# Patient Record
Sex: Male | Born: 1945 | Race: Asian | Hispanic: No | Marital: Married | Smoking: Never smoker
Health system: Southern US, Community
[De-identification: ages and names within clinical notes are randomized; demographics above are authoritative.]

## PROBLEM LIST (undated history)

## (undated) DIAGNOSIS — M1990 Unspecified osteoarthritis, unspecified site: Secondary | ICD-10-CM

## (undated) DIAGNOSIS — Z973 Presence of spectacles and contact lenses: Secondary | ICD-10-CM

## (undated) DIAGNOSIS — U071 COVID-19: Secondary | ICD-10-CM

## (undated) DIAGNOSIS — E785 Hyperlipidemia, unspecified: Secondary | ICD-10-CM

## (undated) DIAGNOSIS — C61 Malignant neoplasm of prostate: Secondary | ICD-10-CM

## (undated) DIAGNOSIS — Z87442 Personal history of urinary calculi: Secondary | ICD-10-CM

## (undated) DIAGNOSIS — N200 Calculus of kidney: Secondary | ICD-10-CM

## (undated) HISTORY — DX: COVID-19: U07.1

## (undated) HISTORY — DX: Hyperlipidemia, unspecified: E78.5

## (undated) HISTORY — DX: Unspecified osteoarthritis, unspecified site: M19.90

---

## 1970-01-02 HISTORY — PX: APPENDECTOMY: SHX54

## 1974-01-02 HISTORY — PX: ABDOMINAL ADHESION SURGERY: SHX90

## 2002-01-06 ENCOUNTER — Ambulatory Visit (HOSPITAL_COMMUNITY): Admission: RE | Admit: 2002-01-06 | Discharge: 2002-01-06 | Payer: Self-pay | Admitting: *Deleted

## 2002-01-06 HISTORY — PX: COLONOSCOPY: SHX5424

## 2003-07-13 ENCOUNTER — Emergency Department (HOSPITAL_COMMUNITY): Admission: EM | Admit: 2003-07-13 | Discharge: 2003-07-13 | Payer: Self-pay | Admitting: Emergency Medicine

## 2003-08-30 ENCOUNTER — Emergency Department (HOSPITAL_COMMUNITY): Admission: EM | Admit: 2003-08-30 | Discharge: 2003-08-31 | Payer: Self-pay | Admitting: Emergency Medicine

## 2007-05-30 ENCOUNTER — Emergency Department (HOSPITAL_COMMUNITY): Admission: EM | Admit: 2007-05-30 | Discharge: 2007-05-30 | Payer: Self-pay | Admitting: Emergency Medicine

## 2007-11-23 ENCOUNTER — Encounter: Admission: RE | Admit: 2007-11-23 | Discharge: 2007-11-23 | Payer: Self-pay | Admitting: Orthopedic Surgery

## 2010-09-28 LAB — POCT I-STAT, CHEM 8
Calcium, Ion: 1.16
Potassium: 4.1
Sodium: 138

## 2010-09-28 LAB — URINALYSIS, ROUTINE W REFLEX MICROSCOPIC
Hgb urine dipstick: NEGATIVE
Ketones, ur: 40 — AB
Nitrite: NEGATIVE
Protein, ur: NEGATIVE
Specific Gravity, Urine: 1.014
pH: 7.5

## 2011-01-26 ENCOUNTER — Encounter: Payer: Self-pay | Admitting: Internal Medicine

## 2011-02-14 ENCOUNTER — Ambulatory Visit (INDEPENDENT_AMBULATORY_CARE_PROVIDER_SITE_OTHER): Payer: 59 | Admitting: Internal Medicine

## 2011-02-14 ENCOUNTER — Encounter: Payer: Self-pay | Admitting: Internal Medicine

## 2011-02-14 VITALS — BP 120/70 | HR 64 | Ht 68.0 in | Wt 145.6 lb

## 2011-02-14 DIAGNOSIS — R1013 Epigastric pain: Secondary | ICD-10-CM | POA: Insufficient documentation

## 2011-02-14 DIAGNOSIS — K3189 Other diseases of stomach and duodenum: Secondary | ICD-10-CM

## 2011-02-14 NOTE — Patient Instructions (Signed)
Stop the fish oil for 1 month to see if this is the cause of your problems if so stay of the medications if not then please give [place] a call back.

## 2011-02-14 NOTE — Progress Notes (Signed)
  Subjective:    Patient ID: Shawn Walsh, male    DOB: 05-25-1945, 66 y.o.   MRN: [id number]  HPI This very pleasant Asian man is here with his wife today for evaluation of heartburn and belching. He says for the past several months he's had an increase in postprandial belching and bloating and burping. There has been some heartburn. He cannot identify any particular triggers. It occurs within 30 minutes of eating. He denies change in bowel habits or lower abdominal bloating or distention. He has not had any unintentional weight loss and there is no dysphagia or bleeding reported. He drinks 1 PM today as far as caffeine is concern that he does not smoke. He did start fish oil in the past few months though he thinks his symptoms of belching and reflux started before the fish oil was begun. He took Zegerid for several weeks and noted an excellent response, to this over-the-counter agent.  His GI review of systems is otherwise negative. I have also seen his son and I believe his daughter as a patient in the past, we collectively remembered today. Allergies  Allergen Reactions  . Augmentin   . Avelox (Moxifloxacin Hcl In Nacl)    No outpatient prescriptions prior to visit.   Past Medical History  Diagnosis Date  . HLD (hyperlipidemia)    Past Surgical History  Procedure Date  . Appendectomy   . Colonoscopy 01/06/2002    normal   History   Social History  . Marital Status: Married    Spouse Name: N/A    Number of Children: 3  .     Occupational History  . insurance    Social History Main Topics  . Smoking status: Never Smoker   . Smokeless tobacco: Never Used  . Alcohol Use: No  . Drug Use: No  . Sexually Active: None     Family History  Problem Relation Age of Onset  . Hyperlipidemia Brother         Review of Systems He feels well otherwise, he does have some hearing difficulty. All other review of systems negative except for as in history of present illness.      Objective:   Physical Exam General:  Well-developed, well-nourished and in no acute distress Eyes:  anicteric. ENT:   Mouth and posterior pharynx free of lesions.  Neck:   supple w/o thyromegaly or mass.  Lungs: Clear to auscultation bilaterally. Heart:  S1S2, no rubs, murmurs, gallops. Abdomen:  soft, non-tender, no hepatosplenomegaly, hernia, or mass and BS+.  Lymph:  no cervical or supraclavicular adenopathy. Extremities:   no edema Neuro:  A&O x 3.  Psych:  appropriate mood and  Affect.   Data Reviewed:  2004 colonoscopy, normal      Assessment & Plan:

## 2011-02-14 NOTE — Assessment & Plan Note (Signed)
Belching, bloating and some heartburn. In the setting of fish oil use. It has responded well to Zegerid which is not currently taking. It could be the fish oil though I am not sure the time course is related. There were no worrisome or danger features so I don't think endoscopy is needed at this time.  The plan is for him to stop his fish oil and see if that'll limited to symptoms over a month. If it does not, then would most likely prescribed Zegerid, at the lower dosage to work. Depending upon the course could need to consider upper endoscopy. He asked about Helicobacter pylori, and his symptoms are more of a reflux-like dyspepsia and not ulcer dyspepsia. Thus, I would not look for or treat that based upon what I know at this time.

## 2011-08-12 DIAGNOSIS — R1013 Epigastric pain: Secondary | ICD-10-CM | POA: Diagnosis not present

## 2011-08-14 ENCOUNTER — Telehealth: Payer: Self-pay | Admitting: Internal Medicine

## 2011-08-14 NOTE — Telephone Encounter (Signed)
Patient advised that I will add him to the cancellation list and call him with an appt if one becomes available.  I did explain that DR. Leone Payor is out of the office this week and the week of 08/28/11

## 2011-09-11 ENCOUNTER — Telehealth: Payer: Self-pay

## 2011-09-11 ENCOUNTER — Other Ambulatory Visit (INDEPENDENT_AMBULATORY_CARE_PROVIDER_SITE_OTHER): Payer: 59

## 2011-09-11 ENCOUNTER — Encounter: Payer: Self-pay | Admitting: Internal Medicine

## 2011-09-11 ENCOUNTER — Ambulatory Visit (INDEPENDENT_AMBULATORY_CARE_PROVIDER_SITE_OTHER): Payer: 59 | Admitting: Internal Medicine

## 2011-09-11 VITALS — BP 128/64 | HR 60 | Ht 68.0 in | Wt 145.0 lb

## 2011-09-11 DIAGNOSIS — R112 Nausea with vomiting, unspecified: Secondary | ICD-10-CM

## 2011-09-11 DIAGNOSIS — K660 Peritoneal adhesions (postprocedural) (postinfection): Secondary | ICD-10-CM

## 2011-09-11 DIAGNOSIS — R1033 Periumbilical pain: Secondary | ICD-10-CM | POA: Diagnosis not present

## 2011-09-11 LAB — CBC WITH DIFFERENTIAL/PLATELET
Basophils Absolute: 0 10*3/uL (ref 0.0–0.1)
Basophils Relative: 0.4 % (ref 0.0–3.0)
Eosinophils Absolute: 0.4 10*3/uL (ref 0.0–0.7)
Eosinophils Relative: 5.8 % — ABNORMAL HIGH (ref 0.0–5.0)
HCT: 44 % (ref 39.0–52.0)
Hemoglobin: 14.4 g/dL (ref 13.0–17.0)
Lymphocytes Relative: 36.1 % (ref 12.0–46.0)
Lymphs Abs: 2.6 10*3/uL (ref 0.7–4.0)
MCHC: 32.6 g/dL (ref 30.0–36.0)
MCV: 94.8 fl (ref 78.0–100.0)
Monocytes Absolute: 0.6 10*3/uL (ref 0.1–1.0)
Monocytes Relative: 8.4 % (ref 3.0–12.0)
Neutro Abs: 3.5 10*3/uL (ref 1.4–7.7)
Neutrophils Relative %: 49.3 % (ref 43.0–77.0)
Platelets: 204 10*3/uL (ref 150.0–400.0)
RBC: 4.64 Mil/uL (ref 4.22–5.81)
RDW: 13.3 % (ref 11.5–14.6)
WBC: 7.1 10*3/uL (ref 4.5–10.5)

## 2011-09-11 LAB — COMPREHENSIVE METABOLIC PANEL
AST: 24 U/L (ref 0–37)
CO2: 28 mEq/L (ref 19–32)
Calcium: 9.5 mg/dL (ref 8.4–10.5)
Chloride: 104 mEq/L (ref 96–112)
Creatinine, Ser: 0.8 mg/dL (ref 0.4–1.5)
Glucose, Bld: 99 mg/dL (ref 70–99)
Potassium: 4 mEq/L (ref 3.5–5.1)
Sodium: 138 mEq/L (ref 135–145)
Total Protein: 7.1 g/dL (ref 6.0–8.3)

## 2011-09-11 NOTE — Patient Instructions (Addendum)
Your physician has requested that you go to the basement for the following lab work before leaving today: CBC, CMET  You have been scheduled for a CT scan of the abdomen and pelvis at [facility] ([address]---this is in the same building as Architectural technologist).   You are scheduled on __9/10/13 at ____2:30pm. You should arrive 15 minutes prior to your appointment time for registration. Please follow the written instructions below on the day of your exam:  WARNING: IF YOU ARE ALLERGIC TO IODINE/X-RAY DYE, PLEASE NOTIFY RADIOLOGY IMMEDIATELY AT [phone]! YOU WILL BE GIVEN A 13 HOUR PREMEDICATION PREP.  1) Do not eat or drink anything after __10:30am_ (4 hours prior to your test) 2) You have been given 2 bottles of oral contrast to drink. The solution may taste  better if refrigerated, but do NOT add ice or any other liquid to this solution. Shake  well before drinking.    Drink 1 bottle of contrast @ __12:30pm__________ (2 hours prior to your exam)  Drink 1 bottle of contrast @ ___1:30pm__________ (1 hour prior to your exam)  You have been given a low fiber diet to read and follow.   Thank you for choosing me and [facility].  Iva Boop, M.D., [facility]   You may take any medications as prescribed with a small amount of water except for the following: Metformin, Glucophage, Glucovance, Avandamet, Riomet, Fortamet, Actoplus Met, Janumet, Glumetza or Metaglip. The above medications must be held the day of the exam AND 48 hours after the exam.  The purpose of you drinking the oral contrast is to aid in the visualization of your intestinal tract. The contrast solution may cause some diarrhea. Before your exam is started, you will be given a small amount of fluid to drink. Depending on your individual set of symptoms, you may also receive an intravenous injection of x-ray contrast/dye. Plan on being at [facility] for 30 minutes or long, depending on the  type of exam you are having performed.  If you have any questions regarding your exam or if you need to reschedule, you may call the CT department at [phone] between the hours of 8:00 am and 5:00 pm, Monday-Friday.  ________________________________________________________________________

## 2011-09-11 NOTE — Telephone Encounter (Signed)
Lm on cell# and home# that we are postponing his CT Abd/pelvis due to insurance issues.  Dr. Leone Payor was made aware of this situation late 09/11/11 and said we will probably wait until he is symptomatic to order again.  I also called and LM on Rose's VM that we are cancelling the CT for now.

## 2011-09-11 NOTE — Progress Notes (Addendum)
Subjective:    Patient ID: Shawn Walsh, male    DOB: [birth year], 66 y.o.   MRN: [id number]  HPI The patient is a very pleasant elderly Asian [place] man for followup. He was last seen in February, he had a lot of abdominal bloating and gas complaints and I advised she stop fish oil. He said that helped him significantly.  However since then he said some recurrent episodes of fairly intense abdominal pain with nausea and vomiting that last for several hours and spontaneously remit. He relates that in 1972 he had an appendectomy. He was living in [place], subsequent had problems with abdominal pain attributed to adhesions and had what sounds like a lysis of adhesions. He had an episode of small bowel obstruction symptoms in 2002. In 2012 he had severe abdominal pain and vomiting for several hours that remitted. Over the past few months she's had 3-4 episodes of similar problems with periumbilical and lower abdominal pain associated with intense pain and nausea and vomiting and then he eventually feels better. After the last episode he did go to the [facility] and he was well and was advised to get GI followup. His wife had wanted her to go to the emergency department when he was ill at night a few weeks ago but he did not. He does not get acutely constipated or have any bowel movement problems. He has no unintentional weight loss. He has not had fevers.   Review of Systems As per history of present illness.    Objective:   Physical Exam General:  NAD Eyes:   anicteric Lungs:  clear Heart:  S1S2 no rubs, murmurs or gallops Abdomen:  soft and nontender, BS+, no herniae, RLQ transverse scar Ext:   no edema    Data Reviewed:  CT scan 2005  Clinical Data: Abdominal pain.  CT ABDOMEN AND PELVIS WITH CONTRAST  Multidetector helical CT imaging was performed through the abdomen and pelvis following dilute oral contrast and 100 cc Omnipaque 300 IV.  CT ABDOMEN  Liver, spleen, pancreas,  adrenals and kidneys unremarkable. Gallbladder and bowel grossly unremarkable except for a large amount of stool throughout the colon. No free fluid, free air, or adenopathy.  IMPRESSION  Constipation. No acute disease.  CT PELVIS  Appendix is not visualized. There is no inflammatory process seen in the right lower quadrant. There is a small amount of free fluid in the cul-de-sac of the pelvis. Large amount of stool is again noted in the colon.  IMPRESSION  Appendix not visualized but no definite evidence of appendicitis.  Small amount of free fluid in the pelvis.  Mild constipation.       Assessment & Plan:   1. Periumbilical pain   2. Nausea and vomiting   3. Intestinal adhesions    1. This sounds like he could be having a recurrent partial small bowel obstructions. It can be difficult to prove. It concerns him a great deal. 2. Had ordered a CT abdomen pelvis but when going to precertification process insurance company balked. The other thing we talked about was, waiting until he had another episode and getting an x-ray. That is not unreasonable. I will call the patient and discuss this, as I do think it is unlikely we would see anything on the CT scan at this time, and would be more likely be old information if he had imaging during the time of symptoms. Patient wishes to proceed with CT if possible. 3. Even if he does  have adhesions, if they are self-limited problems associated with it I think it would be unlikely at any one would operate. This was already explained to the patient. 4. Comprehensive metabolic panel was ordered as well. CBC also  Lab Results  Component Value Date   WBC 7.1 09/11/2011   HGB 14.4 09/11/2011   HCT 44.0 09/11/2011   MCV 94.8 09/11/2011   PLT 204.0 09/11/2011     Chemistry      Component Value Date/Time   NA 138 09/11/2011 1213   K 4.0 09/11/2011 1213   CL 104 09/11/2011 1213   CO2 28 09/11/2011 1213   BUN 20 09/11/2011 1213   CREATININE 0.8 09/11/2011 1213        Component Value Date/Time   CALCIUM 9.5 09/11/2011 1213   ALKPHOS 73 09/11/2011 1213   AST 24 09/11/2011 1213   ALT 21 09/11/2011 1213   BILITOT 1.3* 09/11/2011 1213     Spoke to [facility] - CT approved   #cc [phone] - good through 11/06/2011  4:21 PM Iva Boop, MD, [facility]

## 2011-09-12 ENCOUNTER — Telehealth: Payer: Self-pay

## 2011-09-12 ENCOUNTER — Other Ambulatory Visit: Payer: 59

## 2011-09-12 NOTE — Telephone Encounter (Signed)
Spoke to pt and informed him that we are postponing his CT for now due to insurance issues.  He had not gotten my multiple messages that I left last night for him.  Pt was comfortable with your decision to hold on this for now.  He will call back when/if he becomes symptomatic again.

## 2011-09-19 NOTE — Progress Notes (Signed)
Quick Note:  I think he knows this but labs are ok  He should have a CT if he develops sxs again - either by calling us or going to ED after hours  I can call him when I return if he desires ______

## 2011-09-20 ENCOUNTER — Telehealth: Payer: Self-pay | Admitting: Internal Medicine

## 2011-09-20 DIAGNOSIS — J019 Acute sinusitis, unspecified: Secondary | ICD-10-CM | POA: Diagnosis not present

## 2011-09-25 ENCOUNTER — Encounter: Payer: Self-pay | Admitting: Gastroenterology

## 2011-09-25 ENCOUNTER — Other Ambulatory Visit: Payer: Self-pay

## 2011-09-25 DIAGNOSIS — K660 Peritoneal adhesions (postprocedural) (postinfection): Secondary | ICD-10-CM

## 2011-09-25 DIAGNOSIS — R1033 Periumbilical pain: Secondary | ICD-10-CM

## 2011-09-25 DIAGNOSIS — R112 Nausea with vomiting, unspecified: Secondary | ICD-10-CM

## 2011-09-25 NOTE — Telephone Encounter (Signed)
Would you like to do the MD review on this case?

## 2011-09-25 NOTE — Telephone Encounter (Signed)
Thanks, I see you've already done this call.

## 2011-09-25 NOTE — Telephone Encounter (Signed)
I did MD review and wrote the approval # into referral and my original office note

## 2011-09-25 NOTE — Telephone Encounter (Signed)
Would you like to do the MD review on this one?

## 2011-09-25 NOTE — Progress Notes (Signed)
Pt contacted and informed that his CT Abd. Darrold Junker is set up for Oct. 1st.  At 1 pm,  NPO 4 hours, drink first bottle of contrast at 11:00am and second bottle at 12:00noon.  To be done at [facility]. Location.  Pt aware of location.  He will call back with any questions.

## 2011-10-03 ENCOUNTER — Ambulatory Visit (INDEPENDENT_AMBULATORY_CARE_PROVIDER_SITE_OTHER)
Admission: RE | Admit: 2011-10-03 | Discharge: 2011-10-03 | Disposition: A | Discharge status: Home / Self Care | Payer: 59 | Source: Ambulatory Visit | Attending: Internal Medicine | Admitting: Internal Medicine

## 2011-10-03 DIAGNOSIS — K660 Peritoneal adhesions (postprocedural) (postinfection): Secondary | ICD-10-CM | POA: Diagnosis not present

## 2011-10-03 DIAGNOSIS — N200 Calculus of kidney: Secondary | ICD-10-CM | POA: Diagnosis not present

## 2011-10-03 DIAGNOSIS — R1033 Periumbilical pain: Secondary | ICD-10-CM | POA: Diagnosis not present

## 2011-10-03 DIAGNOSIS — R112 Nausea with vomiting, unspecified: Secondary | ICD-10-CM

## 2011-10-03 MED ORDER — IOHEXOL 300 MG/ML  SOLN
100.0000 mL | Freq: Once | INTRAMUSCULAR | Status: AC | PRN
  Administered 2011-10-03: 100 mL via INTRAVENOUS

## 2011-10-03 NOTE — Progress Notes (Signed)
Quick Note:  Please let him know he has kidney stones (in the kidneys - not a problem unless they move out) but no other problems on the CT. He is likely either having spasms or more likely intermittent partial small bowel obstructions from prior surgery and adhesions. As long as they are self-limited no specific therapy is warranted. He should avoid raw vegetables unless small pieces and if he gets an episode that will not will then go to ED. Office visit prn me  Please cc this note to his PCP ______

## 2011-12-19 DIAGNOSIS — E7800 Pure hypercholesterolemia, unspecified: Secondary | ICD-10-CM | POA: Diagnosis not present

## 2011-12-19 DIAGNOSIS — Z79899 Other long term (current) drug therapy: Secondary | ICD-10-CM | POA: Diagnosis not present

## 2012-01-08 ENCOUNTER — Encounter (HOSPITAL_BASED_OUTPATIENT_CLINIC_OR_DEPARTMENT_OTHER): Payer: Self-pay | Admitting: *Deleted

## 2012-01-08 ENCOUNTER — Emergency Department (HOSPITAL_BASED_OUTPATIENT_CLINIC_OR_DEPARTMENT_OTHER)
Admission: EM | Admit: 2012-01-08 | Discharge: 2012-01-08 | Disposition: A | Discharge status: Home / Self Care | Payer: Medicare Other | Attending: Emergency Medicine | Admitting: Emergency Medicine

## 2012-01-08 DIAGNOSIS — E785 Hyperlipidemia, unspecified: Secondary | ICD-10-CM | POA: Diagnosis not present

## 2012-01-08 DIAGNOSIS — Z8719 Personal history of other diseases of the digestive system: Secondary | ICD-10-CM | POA: Diagnosis not present

## 2012-01-08 DIAGNOSIS — Z79899 Other long term (current) drug therapy: Secondary | ICD-10-CM | POA: Diagnosis not present

## 2012-01-08 DIAGNOSIS — R109 Unspecified abdominal pain: Secondary | ICD-10-CM | POA: Diagnosis not present

## 2012-01-08 LAB — COMPREHENSIVE METABOLIC PANEL
ALT: 18 U/L (ref 0–53)
AST: 20 U/L (ref 0–37)
Chloride: 100 mEq/L (ref 96–112)
Creatinine, Ser: 1 mg/dL (ref 0.50–1.35)
GFR calc Af Amer: 89 mL/min — ABNORMAL LOW (ref >90)
Glucose, Bld: 113 mg/dL — ABNORMAL HIGH (ref 70–99)
Sodium: 139 mEq/L (ref 135–145)

## 2012-01-08 LAB — CBC WITH DIFFERENTIAL/PLATELET
MCHC: 33.6 g/dL (ref 30.0–36.0)
MCV: 92.1 fL (ref 78.0–100.0)
Monocytes Absolute: 0.5 10*3/uL (ref 0.1–1.0)
RBC: 4.78 MIL/uL (ref 4.22–5.81)
RDW: 13.4 % (ref 11.5–15.5)
WBC: 6.4 10*3/uL (ref 4.0–10.5)

## 2012-01-08 LAB — URINALYSIS, ROUTINE W REFLEX MICROSCOPIC
Bilirubin Urine: NEGATIVE
Hgb urine dipstick: NEGATIVE
Protein, ur: NEGATIVE mg/dL
pH: 7 (ref 5.0–8.0)

## 2012-01-08 MED ORDER — METOCLOPRAMIDE HCL 10 MG PO TABS: 10.0000 mg | ORAL_TABLET | Freq: Four times a day (QID) | ORAL | Status: DC | PRN

## 2012-01-08 MED ORDER — ONDANSETRON 8 MG PO TBDP: ORAL_TABLET | ORAL | Status: DC

## 2012-01-08 MED ORDER — SODIUM CHLORIDE 0.9 % IV SOLN
INTRAVENOUS | Status: DC
  Administered 2012-01-08: 20:00:00 via INTRAVENOUS

## 2012-01-08 MED ORDER — ONDANSETRON HCL 4 MG/2ML IJ SOLN: 4.0000 mg | Freq: Once | INTRAMUSCULAR | Status: DC

## 2012-01-08 MED ORDER — HYDROMORPHONE HCL PF 1 MG/ML IJ SOLN: 1.0000 mg | Freq: Once | INTRAMUSCULAR | Status: DC

## 2012-01-08 MED ORDER — SODIUM CHLORIDE 0.9 % IV BOLUS (SEPSIS)
1000.0000 mL | Freq: Once | INTRAVENOUS | Status: AC
  Administered 2012-01-08: 1000 mL via INTRAVENOUS

## 2012-01-08 NOTE — ED Provider Notes (Signed)
History   This chart was scribed for Hurman Horn, MD by Leone Payor, ED Scribe. This patient was seen in room MHOTF/OTF and the patient's care was started at 1827.   CSN: [id number]  Arrival date & time 01/08/12  1742   First MD Initiated Contact with Patient 01/08/12 1827      Chief Complaint  Patient presents with  . Abdominal Pain     The history is provided by the patient. No language interpreter was used.   Shawn Walsh is a 67 y.o. male who presents to the Emergency Department complaining of recurring, multiple intermittent episodes of moderate mid abdominal pain with associated nausea and vomiting every few months. The episodes last for about an hour at a time with the last episode starting 1.5 hours ago. Pt states he noticed the pain earlier today but has not had any vomiting or nausea yet. He denies fever, diarrhea, confusion, trouble breathing, trouble urinating. Suspicion of intermittent partial obstructions per GI.  Pt has h/o HLD, appendectomy.  Pt denies smoking and alcohol use.  Past Medical History  Diagnosis Date  . HLD (hyperlipidemia)     Past Surgical History  Procedure Date  . Appendectomy   . Colonoscopy 01/06/2002    normal    Family History  Problem Relation Age of Onset  . Hyperlipidemia Brother   . Colon cancer Neg Hx     History  Substance Use Topics  . Smoking status: Never Smoker   . Smokeless tobacco: Never Used  . Alcohol Use: No      Review of Systems 10 Systems reviewed and all are negative for acute change except as noted in the HPI.   Allergies  Amoxicillin-pot clavulanate and Avelox  Home Medications   Current Outpatient Rx  Name  Route  Sig  Dispense  Refill  . VITAMIN D3 1000 UNITS PO CAPS   Oral   Take by mouth.         . METOCLOPRAMIDE HCL 10 MG PO TABS   Oral   Take 1 tablet (10 mg total) by mouth every 6 (six) hours as needed (nausea/headache).   6 tablet   0   . CENTRUM SILVER PO   Oral   Take by  mouth.         . ONDANSETRON 8 MG PO TBDP      8mg  ODT q4 hours prn nausea   4 tablet   0   . SIMVASTATIN 20 MG PO TABS   Oral   Take 20 mg by mouth every evening.           BP 119/81  Pulse 69  Temp 97.4 F (36.3 C) (Oral)  Resp 18  SpO2 100%  Physical Exam  Nursing note and vitals reviewed. Constitutional:       Awake, alert, nontoxic appearance.  HENT:  Head: Atraumatic.  Eyes: Right eye exhibits no discharge. Left eye exhibits no discharge.  Neck: Neck supple.  Cardiovascular: Normal rate, regular rhythm and normal heart sounds.   Pulmonary/Chest: Effort normal and breath sounds normal. He exhibits no tenderness.  Abdominal: Soft. Bowel sounds are normal. There is no tenderness. There is no rebound.       Mild suprapubic tenderness.   Genitourinary:       Testicles non tender. No palpable inguinal hernias.   Musculoskeletal: He exhibits no tenderness.       Baseline ROM, no obvious new focal weakness.  Neurological:  Mental status and motor strength appears baseline for patient and situation.  Skin: No rash noted.  Psychiatric: He has a normal mood and affect.    ED Course  Procedures (including critical care time) Pain free and abd SNT. 2020 DIAGNOSTIC STUDIES: Oxygen Saturation is 100% on room air, normal by my interpretation.    COORDINATION OF CARE:   6:35PM Patient / Family / Caregiver understand and agree with initial ED impression and plan with expectations set for ED visit.   Pain free in ED, wants discharge.  Labs Reviewed  CBC WITH DIFFERENTIAL - Abnormal; Notable for the following:    Eosinophils Relative 8 (*)     All other components within normal limits  COMPREHENSIVE METABOLIC PANEL - Abnormal; Notable for the following:    Glucose, Bld 113 (*)     GFR calc non Af Amer 76 (*)     GFR calc Af Amer 89 (*)     All other components within normal limits  URINALYSIS, ROUTINE W REFLEX MICROSCOPIC  LIPASE, BLOOD   No results  found.   1. Abdominal pain, acute       MDM  I personally performed the services described in this documentation, which was scribed in my presence. The recorded information has been reviewed and is accurate.  Patient / Family / Caregiver informed of clinical course, understand medical decision-making process, and agree with plan.  I doubt any other EMC precluding discharge at this time including, but not necessarily limited to the following: complete obstruction.  Hurman Horn, MD 01/10/12 [id number]

## 2012-01-08 NOTE — ED Notes (Signed)
abdominal pain. States he has a hx of adhesions.

## 2012-01-08 NOTE — ED Notes (Signed)
Patient states that he has a bad case of adhesions. States that he is being followed by [facility], Dr Leone Payor.  States that he began having abdominal pain about 2 hours ago. Pain is mid abdominal just below the navel. Denies nausea, vomiting and diarrhea at this time.  To [facility] per Dr Marvell Fuller instructions at last OV.

## 2012-02-12 DIAGNOSIS — Z1211 Encounter for screening for malignant neoplasm of colon: Secondary | ICD-10-CM | POA: Diagnosis not present

## 2012-02-12 DIAGNOSIS — R1084 Generalized abdominal pain: Secondary | ICD-10-CM | POA: Diagnosis not present

## 2012-02-19 ENCOUNTER — Encounter (INDEPENDENT_AMBULATORY_CARE_PROVIDER_SITE_OTHER): Payer: Self-pay | Admitting: Surgery

## 2012-02-19 ENCOUNTER — Ambulatory Visit (INDEPENDENT_AMBULATORY_CARE_PROVIDER_SITE_OTHER): Payer: 59 | Admitting: Surgery

## 2012-02-19 VITALS — BP 124/72 | HR 67 | Temp 97.6°F | Resp 18 | Ht 67.0 in | Wt 145.0 lb

## 2012-02-19 DIAGNOSIS — R1032 Left lower quadrant pain: Secondary | ICD-10-CM | POA: Diagnosis not present

## 2012-02-19 DIAGNOSIS — R112 Nausea with vomiting, unspecified: Secondary | ICD-10-CM | POA: Diagnosis not present

## 2012-02-19 NOTE — Progress Notes (Signed)
Subjective:     Patient ID: Shawn Walsh, male   DOB: 1945-10-25, 67 y.o.   MRN: [id number]  HPI  Shawn Walsh  January 16, 1945 [id number]  Patient Care Team: Gildardo Cranker, MD as PCP - General (Family Medicine) Iva Boop, MD as Consulting Physician (Gastroenterology) Shirley Friar, MD as Consulting Physician (Gastroenterology)  This patient is a 67 y.o.male who presents today for surgical evaluation at the request of Dr. Bosie Clos.   Reason for visit: Intermittent episodes of severe abdominal pain nausea and vomiting.  Concern for chronic partial small bowel obstruction.  Pleasant vegetarian male originally from [place].  He comes today with his wife.  Had an open appendectomy in 1972 for acute appendicitis.  Had abdominal symptoms and underwent exploration through that same small incision 1976.  He recalls being told he had a bowel obstruction.  This is when he lived in [place].  Relocated to the [place]  He did relatively well until 10 years she began to get episodes of intermittent pain.  Usually he will have some cramping discomfort after lunch.  After he eats dinner it gets much worse.  Progresses to nausea and vomiting.  Has had a couple episodes where he has been told it is feculent.  Pain take several hours to resolve.  He had an episode of rather severe six years ago.  Workup was negative.  He has had three more episodes this past year.  He has been to the ER.  Saw two gastroenterologists.  Endoscopy showing mild gastritis in the distant past.  His workup otherwise not conclusive.  No personal nor family history of GI/colon cancer, inflammatory bowel disease, irritable bowel syndrome, allergy such as Celiac Sprue, dietary/dairy problems, colitis, ulcers nor gastritis.  No recent sick contacts/gastroenteritis.  No travel outside the country.  No changes in diet.  Colonoscopy 2004 normal.  No incidents of rectal bleeding.  No history of hemorrhoids.  History of diverticulitis.  No  diverticulosis noted on his colonoscopy.  Tends to stick to a low fat vegetarian diet provided by his wife.  He tends to avoid spicy foods.  Does not eat heavy or greasy food.  Can walk 2 miles without difficulty.  Because of more frequent symptoms that reminded him of his bowel obstruction in 1976, he was sent to me for evaluation to see if surgery would be of benefit.   Patient Active Problem List  Diagnosis  . Dyspepsia  . Abdominal pain, LLQ (left lower quadrant)  . Nausea with vomiting, possible intermittent SBO    Past Medical History  Diagnosis Date  . HLD (hyperlipidemia)     Past Surgical History  Procedure Laterality Date  . Colonoscopy  01/06/2002    normal  . Appendectomy  1972  . Abdominal adhesion surgery  [place]    [place]    History   Social History  . Marital Status: Married    Spouse Name: N/A    Number of Children: 3  . Years of Education: N/A   Occupational History  . Insurance     Social History Main Topics  . Smoking status: Never Smoker   . Smokeless tobacco: Never Used  . Alcohol Use: No  . Drug Use: No  . Sexually Active: Not on file   Other Topics Concern  . Not on file   Social History Narrative   Sales promotion account executive.  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   Vegetarian.    Family History  Problem Relation Age  of Onset  . Hyperlipidemia Brother   . Colon cancer Neg Hx     Current Outpatient Prescriptions  Medication Sig Dispense Refill  . Cholecalciferol (VITAMIN D3) 1000 UNITS CAPS Take by mouth.      . Multiple Vitamins-Minerals (CENTRUM SILVER PO) Take by mouth.      . simvastatin (ZOCOR) 20 MG tablet Take 20 mg by mouth every evening.      . metoCLOPramide (REGLAN) 10 MG tablet Take 1 tablet (10 mg total) by mouth every 6 (six) hours as needed (nausea/headache).  6 tablet  0  . ondansetron (ZOFRAN ODT) 8 MG disintegrating tablet 8mg  ODT q4 hours prn nausea  4 tablet  0   No current facility-administered  medications for this visit.     Allergies  Allergen Reactions  . Amoxicillin-Pot Clavulanate   . Avelox (Moxifloxacin Hcl In Nacl)     BP 124/72  Pulse 67  Temp(Src) 97.6 F (36.4 C)  Resp 18  Ht 5\' 7"  (1.702 m)  Wt 145 lb (65.772 kg)  BMI 22.71 kg/m2  No results found.   Review of Systems  Constitutional: Negative for fever, chills and diaphoresis.  HENT: Negative for nosebleeds, sore throat, facial swelling, mouth sores, trouble swallowing and ear discharge.   Eyes: Negative for photophobia, discharge and visual disturbance.  Respiratory: Negative for choking, chest tightness, shortness of breath and stridor.   Cardiovascular: Negative for chest pain, palpitations and leg swelling.  Gastrointestinal: Positive for nausea, vomiting and abdominal pain. Negative for diarrhea, constipation, blood in stool, anal bleeding and rectal pain.  Endocrine: Positive for heat intolerance. Negative for cold intolerance, polydipsia, polyphagia and polyuria.  Genitourinary: Negative for dysuria, urgency, frequency, hematuria, penile swelling, scrotal swelling, difficulty urinating, penile pain and testicular pain.  Musculoskeletal: Negative for myalgias, back pain, arthralgias and gait problem.  Skin: Negative for color change, pallor, rash and wound.  Allergic/Immunologic: Negative for food allergies and immunocompromised state.  Neurological: Negative for dizziness, tremors, speech difficulty, weakness, numbness and headaches.  Hematological: Negative for adenopathy. Does not bruise/bleed easily.  Psychiatric/Behavioral: Negative for hallucinations, confusion and agitation.       Objective:   Physical Exam  Constitutional: He is oriented to person, place, and time. He appears well-developed and well-nourished. No distress.  HENT:  Head: Normocephalic.  Mouth/Throat: Oropharynx is clear and moist. No oropharyngeal exudate.  Eyes: Conjunctivae and EOM are normal. Pupils are equal, round,  and reactive to light. No scleral icterus.  Neck: Normal range of motion. Neck supple. No tracheal deviation present.  Cardiovascular: Normal rate, regular rhythm and intact distal pulses.   Pulmonary/Chest: Effort normal and breath sounds normal. No respiratory distress.  Abdominal: Soft. Bowel sounds are normal. He exhibits no distension, no pulsatile liver, no fluid wave, no abdominal bruit and no pulsatile midline mass. There is no tenderness. There is no rigidity, no guarding, no CVA tenderness, no tenderness at McBurney's point and negative Murphy's sign. Hernia confirmed negative in the ventral area, confirmed negative in the right inguinal area and confirmed negative in the left inguinal area.    Musculoskeletal: Normal range of motion. He exhibits no tenderness.  Lymphadenopathy:    He has no cervical adenopathy.       Right: No inguinal adenopathy present.       Left: No inguinal adenopathy present.  Neurological: He is alert and oriented to person, place, and time. No cranial nerve deficit. He exhibits normal muscle tone. Coordination normal.  Skin: Skin is warm and  dry. No rash noted. He is not diaphoretic. No erythema. No pallor.  Psychiatric: He has a normal mood and affect. His behavior is normal. Judgment and thought content normal.   *RADIOLOGY REPORT*  Clinical Data: Abdominal pain, nausea  CT ABDOMEN AND PELVIS WITH CONTRAST Oct2013 Technique: Multidetector CT imaging of the abdomen and pelvis was  performed following the standard protocol during bolus  administration of intravenous contrast.  Contrast: OMNIPAQUE IOHEXOL 300 MG/ML SOLN  Comparison: 08/31/2003  Findings: Dependent atelectasis posteriorly in the visualized lung  bases. Unremarkable liver, nondistended gallbladder, spleen,  pancreas. Bilateral nephrolithiasis, with a 2 mm calculus in the  interpolar region left renal collecting system, a 3 mm  calcification in the interpolar region of the right renal   collecting system. No hydronephrosis. Ureters decompressed  without calculus. Urinary bladder physiologically distended.  Moderate prostatic enlargement. No ascites. No free air. Stomach  and small bowel are nondistended. The appendix is surgically  absent by history. The colon is nondilated, unremarkable. No  pelvic, retroperitoneal, or mesenteric adenopathy. Portal vein is  patent. Normal bilateral renal excretion on delayed scans.  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  IMPRESSION:  1. No acute abdominal process.  2. Bilateral nephrolithiasis without ureteral calculus,  hydronephrosis, or ureterectasis.  Original Report Authenticated By: Osa Craver, M.D.      Assessment:     Intermittent episodes of nausea vomiting abdominal pain postprandial.  Suspicious for chronic partial small bowel obstruction.  Differential diagnosis otherwise unlikely.     Plan:     I am trying to find other reasons to explain this, however, Story seems most suspicious for intermittent obstruction.  It does not seem consistent with biliary colic.  Vegetarian with low-fat diet.  No intermittent symptoms suspicious for bowel syndrome.  Two gastrologist skeptical of other etiologies as well.  No definite proof on CT scan.  However, no real studies ever done while he is in the middle of an attack.  I recommend he consider diagnostic laparoscopy with lysis of adhesions.  Help rule out other etiologies.  He has never really had a laparotomy since he has had the right lower quadrant incision from his open appendectomy.  His lysis lesions was right around that area as well.  Given the fact he has had two prior surgeries, hopeful that his risk of bowel injury or other complications is low.  He has been otherwise healthy and hand.  Operative risks are low as well.  I did discuss procedure with him:  The anatomy & physiology of the digestive tract was discussed.   The pathophysiology of intestinal obstruction was discussed.  Natural history risks without surgery was discussed.   I feel the patient has failed non-operative therapies.  The risks of no intervention will lead to serious problems such as necrosis, perforation, dehydration, etc. that outweigh the operative risks; therefore, I recommended abdominal exploration to diagnose & treat the source of the problem.  Laparoscopic & open techniques were discussed.   I expressed a good likelihood that surgery will treat the problem.  Risks such as bleeding, infection, abscess, leak, reoperation, bowel resection, possible ostomy, hernia, heart attack, death, and other risks were discussed.   I noted a good likelihood this will help address the problem.  Goals of post-operative recovery were discussed as well.  We will work to minimize complications. Questions were answered.  The patient expresses understanding & wishes to proceed with surgery.  I  strongly recommend he get his colonoscopy done.  I would not do it until that is negative.  He is due to do that in early April.  I can move about but his symptoms accelerate.  Well chewed diet.  Low-residue.  If he gets another attack of pain, switch to liquids for 48 hours and then regroup.  He and his wife agree.

## 2012-02-19 NOTE — Patient Instructions (Addendum)
GETTING TO GOOD BOWEL HEALTH.  Continue vegetarian high fiber diet.  Try to get at least 30 g aday  Chew food well to have the consistency of baby food/pured.  If you have an episode of abdominal pain or discomfort, switch to liquid diet only for at least 48 hours and then reevaluate.  Consider surgery to laparoscopically explore the abdomen and see if adhesions are causing the symptoms.  Call us to schedule this if you are interested.  Get your colonoscopy done to make sure the colon is not a potential source of your symptoms.  Irregular bowel habits such as constipation and diarrhea can lead to many problems over time.  Having one soft bowel movement a day is the most important way to prevent further problems.  The anorectal canal is designed to handle stretching and feces to safely manage our ability to get rid of solid waste (feces, poop, stool) out of our body.  BUT, hard constipated stools can act like ripping concrete bricks and diarrhea can be a burning fire to this very sensitive area of our body, causing inflamed hemorrhoids, anal fissures, increasing risk is perirectal abscesses, abdominal pain/bloating, an making irritable bowel worse.     The goal: ONE SOFT BOWEL MOVEMENT A DAY!  To have soft, regular bowel movements:    Drink at least 8 tall glasses of water a day.     Take plenty of fiber.  Fiber is the undigested part of plant food that passes into the colon, acting s "natures broom" to encourage bowel motility and movement.  Fiber can absorb and hold large amounts of water. This results in a larger, bulkier stool, which is soft and easier to pass. Work gradually over several weeks up to 6 servings a day of fiber (25g a day even more if needed) in the form of: o Vegetables -- Root (potatoes, carrots, turnips), leafy green (lettuce, salad greens, celery, spinach), or cooked high residue (cabbage, broccoli, etc) o Fruit -- Fresh (unpeeled skin & pulp), Dried (prunes, apricots,  cherries, etc ),  or stewed ( applesauce)  o Whole grain breads, pasta, etc (whole wheat)  o Bran cereals    Bulking Agents -- This type of water-retaining fiber generally is easily obtained each day by one of the following:  o Psyllium bran -- The psyllium plant is remarkable because its ground seeds can retain so much water. This product is available as Metamucil, Konsyl, Effersyllium, Per Diem Fiber, or the less expensive generic preparation in drug and health food stores. Although labeled a laxative, it really is not a laxative.  o Methylcellulose -- This is another fiber derived from wood which also retains water. It is available as Citrucel. o Polyethylene Glycol - and "artificial" fiber commonly called Miralax or Glycolax.  It is helpful for people with gassy or bloated feelings with regular fiber o Flax Seed - a less gassy fiber than psyllium   No reading or other relaxing activity while on the toilet. If bowel movements take longer than 5 minutes, you are too constipated   AVOID CONSTIPATION.  High fiber and water intake usually takes care of this.  Sometimes a laxative is needed to stimulate more frequent bowel movements, but    Laxatives are not a good long-term solution as it can wear the colon out. o Osmotics (Milk of Magnesia, Fleets phosphosoda, Magnesium citrate, MiraLax, GoLytely) are safer than  o Stimulants (Senokot, Castor Oil, Dulcolax, Ex Lax)    o Do not take laxatives  for more than 7days in a row.    IF SEVERELY CONSTIPATED, try a Bowel Retraining Program: o Do not use laxatives.  o Eat a diet high in roughage, such as bran cereals and leafy vegetables.  o Drink six (6) ounces of prune or apricot juice each morning.  o Eat two (2) large servings of stewed fruit each day.  o Take one (1) heaping tablespoon of a psyllium-based bulking agent twice a day. Use sugar-free sweetener when possible to avoid excessive calories.  o Eat a normal breakfast.  o Set aside 15 minutes  after breakfast to sit on the toilet, but do not strain to have a bowel movement.  o If you do not have a bowel movement by the third day, use an enema and repeat the above steps.    Controlling diarrhea o Switch to liquids and simpler foods for a few days to avoid stressing your intestines further. o Avoid dairy products (especially milk & ice cream) for a short time.  The intestines often can lose the ability to digest lactose when stressed. o Avoid foods that cause gassiness or bloating.  Typical foods include beans and other legumes, cabbage, broccoli, and dairy foods.  Every person has some sensitivity to other foods, so listen to our body and avoid those foods that trigger problems for you. o Adding fiber (Citrucel, Metamucil, psyllium, Miralax) gradually can help thicken stools by absorbing excess fluid and retrain the intestines to act more normally.  Slowly increase the dose over a few weeks.  Too much fiber too soon can backfire and cause cramping & bloating. o Probiotics (such as active yogurt, Align, etc) may help repopulate the intestines and colon with normal bacteria and calm down a sensitive digestive tract.  Most studies show it to be of mild help, though, and such products can be costly. o Medicines:   Bismuth subsalicylate (ex. Kayopectate, Pepto Bismol) every 30 minutes for up to 6 doses can help control diarrhea.  Avoid if pregnant.   Loperamide (Immodium) can slow down diarrhea.  Start with two tablets (4mg  total) first and then try one tablet every 6 hours.  Avoid if you are having fevers or severe pain.  If you are not better or start feeling worse, stop all medicines and call your doctor for advice o Call your doctor if you are getting worse or not better.  Sometimes further testing (cultures, endoscopy, X-ray studies, bloodwork, etc) may be needed to help diagnose and treat the cause of the diarrhea. o

## 2012-04-07 DIAGNOSIS — H2510 Age-related nuclear cataract, unspecified eye: Secondary | ICD-10-CM | POA: Diagnosis not present

## 2012-04-07 DIAGNOSIS — H5200 Hypermetropia, unspecified eye: Secondary | ICD-10-CM | POA: Diagnosis not present

## 2012-05-15 ENCOUNTER — Encounter (HOSPITAL_BASED_OUTPATIENT_CLINIC_OR_DEPARTMENT_OTHER): Payer: Self-pay | Admitting: Emergency Medicine

## 2012-05-15 ENCOUNTER — Emergency Department (HOSPITAL_BASED_OUTPATIENT_CLINIC_OR_DEPARTMENT_OTHER): Payer: Medicare Other

## 2012-05-15 ENCOUNTER — Emergency Department (HOSPITAL_BASED_OUTPATIENT_CLINIC_OR_DEPARTMENT_OTHER)
Admission: EM | Admit: 2012-05-15 | Discharge: 2012-05-15 | Disposition: A | Discharge status: Home / Self Care | Payer: Medicare Other | Attending: Emergency Medicine | Admitting: Emergency Medicine

## 2012-05-15 DIAGNOSIS — N200 Calculus of kidney: Secondary | ICD-10-CM | POA: Diagnosis not present

## 2012-05-15 DIAGNOSIS — Z79899 Other long term (current) drug therapy: Secondary | ICD-10-CM | POA: Insufficient documentation

## 2012-05-15 DIAGNOSIS — K5900 Constipation, unspecified: Secondary | ICD-10-CM | POA: Diagnosis not present

## 2012-05-15 DIAGNOSIS — Z9089 Acquired absence of other organs: Secondary | ICD-10-CM | POA: Insufficient documentation

## 2012-05-15 DIAGNOSIS — E785 Hyperlipidemia, unspecified: Secondary | ICD-10-CM | POA: Diagnosis not present

## 2012-05-15 DIAGNOSIS — N201 Calculus of ureter: Secondary | ICD-10-CM | POA: Diagnosis not present

## 2012-05-15 DIAGNOSIS — Z9889 Other specified postprocedural states: Secondary | ICD-10-CM | POA: Diagnosis not present

## 2012-05-15 DIAGNOSIS — R1110 Vomiting, unspecified: Secondary | ICD-10-CM | POA: Diagnosis not present

## 2012-05-15 DIAGNOSIS — R109 Unspecified abdominal pain: Secondary | ICD-10-CM | POA: Diagnosis not present

## 2012-05-15 DIAGNOSIS — R1031 Right lower quadrant pain: Secondary | ICD-10-CM

## 2012-05-15 LAB — CBC WITH DIFFERENTIAL/PLATELET
Basophils Absolute: 0.1 10*3/uL (ref 0.0–0.1)
Basophils Relative: 1 % (ref 0–1)
Eosinophils Absolute: 0.6 10*3/uL (ref 0.0–0.7)
Eosinophils Relative: 8 % — ABNORMAL HIGH (ref 0–5)
HCT: 42.3 % (ref 39.0–52.0)
Hemoglobin: 14.6 g/dL (ref 13.0–17.0)
MCH: 31.9 pg (ref 26.0–34.0)
MCHC: 34.5 g/dL (ref 30.0–36.0)
MCV: 92.4 fL (ref 78.0–100.0)
Monocytes Absolute: 1 10*3/uL (ref 0.1–1.0)
Monocytes Relative: 13 % — ABNORMAL HIGH (ref 3–12)
Neutro Abs: 2.7 10*3/uL (ref 1.7–7.7)
RDW: 13.7 % (ref 11.5–15.5)

## 2012-05-15 LAB — URINALYSIS, ROUTINE W REFLEX MICROSCOPIC
Glucose, UA: NEGATIVE mg/dL
Ketones, ur: 15 mg/dL — AB
Leukocytes, UA: NEGATIVE
Nitrite: NEGATIVE
Protein, ur: NEGATIVE mg/dL
pH: 5.5 (ref 5.0–8.0)

## 2012-05-15 LAB — COMPREHENSIVE METABOLIC PANEL
AST: 17 U/L (ref 0–37)
Albumin: 3.8 g/dL (ref 3.5–5.2)
BUN: 26 mg/dL — ABNORMAL HIGH (ref 6–23)
Calcium: 9.7 mg/dL (ref 8.4–10.5)
Chloride: 104 mEq/L (ref 96–112)
Creatinine, Ser: 1.3 mg/dL (ref 0.50–1.35)
Total Bilirubin: 0.8 mg/dL (ref 0.3–1.2)

## 2012-05-15 LAB — URINE MICROSCOPIC-ADD ON

## 2012-05-15 LAB — LIPASE, BLOOD: Lipase: 38 U/L (ref 11–59)

## 2012-05-15 MED ORDER — SODIUM CHLORIDE 0.9 % IV BOLUS (SEPSIS)
1000.0000 mL | Freq: Once | INTRAVENOUS | Status: AC
  Administered 2012-05-15: 1000 mL via INTRAVENOUS

## 2012-05-15 MED ORDER — TAMSULOSIN HCL 0.4 MG PO CAPS: 0.4000 mg | ORAL_CAPSULE | Freq: Every day | ORAL | Status: DC

## 2012-05-15 MED ORDER — FENTANYL CITRATE 0.05 MG/ML IJ SOLN
INTRAMUSCULAR | Status: AC
  Administered 2012-05-15: 50 ug via INTRAVENOUS
  Filled 2012-05-15: qty 2

## 2012-05-15 MED ORDER — KETOROLAC TROMETHAMINE 30 MG/ML IJ SOLN
INTRAMUSCULAR | Status: AC
  Administered 2012-05-15: 30 mg via INTRAVENOUS
  Filled 2012-05-15: qty 1

## 2012-05-15 MED ORDER — ONDANSETRON HCL 4 MG/2ML IJ SOLN
INTRAMUSCULAR | Status: AC
  Administered 2012-05-15: 4 mg via INTRAVENOUS
  Filled 2012-05-15: qty 2

## 2012-05-15 MED ORDER — OXYCODONE-ACETAMINOPHEN 5-325 MG PO TABS: 2.0000 | ORAL_TABLET | ORAL | Status: DC | PRN

## 2012-05-15 MED ORDER — ONDANSETRON 8 MG PO TBDP: ORAL_TABLET | ORAL | Status: DC

## 2012-05-15 NOTE — ED Notes (Signed)
MD at bedside. 

## 2012-05-15 NOTE — ED Notes (Signed)
Patient requesting something for pain. MD notified.

## 2012-05-15 NOTE — ED Provider Notes (Signed)
History     CSN: [id number]  Arrival date & time 05/15/12  0017   None     No chief complaint on file.   (Consider location/radiation/quality/duration/timing/severity/associated sxs/prior treatment) Patient is a 67 y.o. male presenting with abdominal pain. The history is provided by the patient.  Abdominal Pain Pain location:  RLQ Pain radiates to:  Does not radiate Pain severity:  Severe Onset quality:  Gradual Timing:  Intermittent Progression:  Worsening Chronicity:  Recurrent Relieved by:  Vomiting Worsened by:  Nothing tried Ineffective treatments:  None tried Associated symptoms: vomiting   Associated symptoms: no chest pain and no fever   Risk factors: being elderly     Past Medical History  Diagnosis Date  . HLD (hyperlipidemia)     Past Surgical History  Procedure Laterality Date  . Colonoscopy  01/06/2002    normal  . Appendectomy  1972  . Abdominal adhesion surgery  1976    [place]    Family History  Problem Relation Age of Onset  . Hyperlipidemia Brother   . Colon cancer Neg Hx     History  Substance Use Topics  . Smoking status: Never Smoker   . Smokeless tobacco: Never Used  . Alcohol Use: No      Review of Systems  Constitutional: Negative for fever.  Cardiovascular: Negative for chest pain.  Gastrointestinal: Positive for vomiting and abdominal pain.  All other systems reviewed and are negative.    Allergies  Amoxicillin-pot clavulanate and Avelox  Home Medications   Current Outpatient Rx  Name  Route  Sig  Dispense  Refill  . Cholecalciferol (VITAMIN D3) 1000 UNITS CAPS   Oral   Take by mouth.         . metoCLOPramide (REGLAN) 10 MG tablet   Oral   Take 1 tablet (10 mg total) by mouth every 6 (six) hours as needed (nausea/headache).   6 tablet   0   . Multiple Vitamins-Minerals (CENTRUM SILVER PO)   Oral   Take by mouth.         . ondansetron (ZOFRAN ODT) 8 MG disintegrating tablet      8mg  ODT q4 hours prn  nausea   4 tablet   0   . simvastatin (ZOCOR) 20 MG tablet   Oral   Take 20 mg by mouth every evening.           There were no vitals taken for this visit.  Physical Exam  Constitutional: He is oriented to person, place, and time. He appears well-developed and well-nourished. No distress.  HENT:  Head: Normocephalic and atraumatic.  Eyes: Conjunctivae are normal. Pupils are equal, round, and reactive to light.  Neck: Normal range of motion. Neck supple.  Cardiovascular: Normal rate, regular rhythm and intact distal pulses.   Pulmonary/Chest: Effort normal and breath sounds normal. He has no wheezes. He has no rales.  Musculoskeletal: Normal range of motion.  Neurological: He is alert and oriented to person, place, and time.  Skin: Skin is warm and dry.  Psychiatric: He has a normal mood and affect.    ED Course  Procedures (including critical care time)  Labs Reviewed  CBC WITH DIFFERENTIAL  COMPREHENSIVE METABOLIC PANEL  LIPASE, BLOOD  URINALYSIS, ROUTINE W REFLEX MICROSCOPIC   No results found.   No diagnosis found.    MDM  Will treat for kidney stones and constipation.  Follow up with urology and your family doctor patient and wife verbalize understanding and agree to  follow up      Results for orders placed during the hospital encounter of 05/15/12  CBC WITH DIFFERENTIAL      Result Value Range   WBC 7.8  4.0 - 10.5 K/uL   RBC 4.58  4.22 - 5.81 MIL/uL   Hemoglobin 14.6  13.0 - 17.0 g/dL   HCT 16.1  09.6 - 04.5 %   MCV 92.4  78.0 - 100.0 fL   MCH 31.9  26.0 - 34.0 pg   MCHC 34.5  30.0 - 36.0 g/dL   RDW 40.9  81.1 - 91.4 %   Platelets 213  150 - 400 K/uL   Neutrophils Relative % 35 (*) 43 - 77 %   Neutro Abs 2.7  1.7 - 7.7 K/uL   Lymphocytes Relative 44  12 - 46 %   Lymphs Abs 3.4  0.7 - 4.0 K/uL   Monocytes Relative 13 (*) 3 - 12 %   Monocytes Absolute 1.0  0.1 - 1.0 K/uL   Eosinophils Relative 8 (*) 0 - 5 %   Eosinophils Absolute 0.6  0.0 -  0.7 K/uL   Basophils Relative 1  0 - 1 %   Basophils Absolute 0.1  0.0 - 0.1 K/uL  COMPREHENSIVE METABOLIC PANEL      Result Value Range   Sodium 141  135 - 145 mEq/L   Potassium 3.7  3.5 - 5.1 mEq/L   Chloride 104  96 - 112 mEq/L   CO2 28  19 - 32 mEq/L   Glucose, Bld 109 (*) 70 - 99 mg/dL   BUN 26 (*) 6 - 23 mg/dL   Creatinine, Ser 7.82  0.50 - 1.35 mg/dL   Calcium 9.7  8.4 - 95.6 mg/dL   Total Protein 7.3  6.0 - 8.3 g/dL   Albumin 3.8  3.5 - 5.2 g/dL   AST 17  0 - 37 U/L   ALT 14  0 - 53 U/L   Alkaline Phosphatase 87  39 - 117 U/L   Total Bilirubin 0.8  0.3 - 1.2 mg/dL   GFR calc non Af Amer 55 (*) >90 mL/min   GFR calc Af Amer 64 (*) >90 mL/min  LIPASE, BLOOD      Result Value Range   Lipase 38  11 - 59 U/L  URINALYSIS, ROUTINE W REFLEX MICROSCOPIC      Result Value Range   Color, Urine YELLOW  YELLOW   APPearance CLOUDY (*) CLEAR   Specific Gravity, Urine 1.026  1.005 - 1.030   pH 5.5  5.0 - 8.0   Glucose, UA NEGATIVE  NEGATIVE mg/dL   Hgb urine dipstick LARGE (*) NEGATIVE   Bilirubin Urine NEGATIVE  NEGATIVE   Ketones, ur 15 (*) NEGATIVE mg/dL   Protein, ur NEGATIVE  NEGATIVE mg/dL   Urobilinogen, UA 0.2  0.0 - 1.0 mg/dL   Nitrite NEGATIVE  NEGATIVE   Leukocytes, UA NEGATIVE  NEGATIVE  URINE MICROSCOPIC-ADD ON      Result Value Range   Squamous Epithelial / LPF RARE  RARE   WBC, UA 0-2  <3 WBC/hpf   RBC / HPF TOO NUMEROUS TO COUNT  <3 RBC/hpf   Bacteria, UA FEW (*) RARE   Crystals CA OXALATE CRYSTALS (*) NEGATIVE   Urine-Other MUCOUS PRESENT     No results found.  Medications  ondansetron (ZOFRAN) 4 MG/2ML injection (not administered)  fentaNYL (SUBLIMAZE) 0.05 MG/ML injection (not administered)  ketorolac (TORADOL) 30 MG/ML injection (not administered)  Jasmine Awe, MD 05/15/12 [phone]

## 2012-05-15 NOTE — ED Notes (Signed)
Patient transported to X-ray via stretcher 

## 2012-05-15 NOTE — ED Notes (Signed)
Patient c/o abdominal pain, onset 30 min ago, progressively becoming worse. Also reports N/V. Sees gastroenterologist for abdominal adhesion.

## 2012-05-15 NOTE — ED Notes (Signed)
Patient reports previous "pain medication not helping, Pain 9/10". Will notify MD.

## 2012-07-26 ENCOUNTER — Emergency Department (HOSPITAL_BASED_OUTPATIENT_CLINIC_OR_DEPARTMENT_OTHER)
Admission: EM | Admit: 2012-07-26 | Discharge: 2012-07-27 | Disposition: A | Discharge status: Home / Self Care | Payer: Medicare Other | Attending: Emergency Medicine | Admitting: Emergency Medicine

## 2012-07-26 ENCOUNTER — Encounter (HOSPITAL_BASED_OUTPATIENT_CLINIC_OR_DEPARTMENT_OTHER): Payer: Self-pay | Admitting: *Deleted

## 2012-07-26 ENCOUNTER — Emergency Department (HOSPITAL_BASED_OUTPATIENT_CLINIC_OR_DEPARTMENT_OTHER): Payer: Medicare Other

## 2012-07-26 DIAGNOSIS — E785 Hyperlipidemia, unspecified: Secondary | ICD-10-CM | POA: Insufficient documentation

## 2012-07-26 DIAGNOSIS — R071 Chest pain on breathing: Secondary | ICD-10-CM | POA: Insufficient documentation

## 2012-07-26 DIAGNOSIS — R091 Pleurisy: Secondary | ICD-10-CM

## 2012-07-26 DIAGNOSIS — R0789 Other chest pain: Secondary | ICD-10-CM

## 2012-07-26 DIAGNOSIS — R079 Chest pain, unspecified: Secondary | ICD-10-CM | POA: Diagnosis not present

## 2012-07-26 DIAGNOSIS — Z79899 Other long term (current) drug therapy: Secondary | ICD-10-CM | POA: Diagnosis not present

## 2012-07-26 LAB — CBC WITH DIFFERENTIAL/PLATELET
Eosinophils Relative: 6 % — ABNORMAL HIGH (ref 0–5)
HCT: 43.3 % (ref 39.0–52.0)
Hemoglobin: 14.7 g/dL (ref 13.0–17.0)
Lymphocytes Relative: 32 % (ref 12–46)
MCV: 92.5 fL (ref 78.0–100.0)
Monocytes Absolute: 0.6 10*3/uL (ref 0.1–1.0)
Monocytes Relative: 8 % (ref 3–12)
Neutro Abs: 3.6 10*3/uL (ref 1.7–7.7)
WBC: 6.8 10*3/uL (ref 4.0–10.5)

## 2012-07-26 LAB — COMPREHENSIVE METABOLIC PANEL
BUN: 20 mg/dL (ref 6–23)
CO2: 26 mEq/L (ref 19–32)
Chloride: 98 mEq/L (ref 96–112)
Creatinine, Ser: 1.1 mg/dL (ref 0.50–1.35)
GFR calc Af Amer: 78 mL/min — ABNORMAL LOW (ref >90)
GFR calc non Af Amer: 68 mL/min — ABNORMAL LOW (ref >90)
Glucose, Bld: 108 mg/dL — ABNORMAL HIGH (ref 70–99)
Total Bilirubin: 0.7 mg/dL (ref 0.3–1.2)

## 2012-07-26 LAB — TROPONIN I
Troponin I: <0.3 ng/mL (ref <0.30)
Troponin I: <0.3 ng/mL (ref <0.30)

## 2012-07-26 NOTE — ED Provider Notes (Signed)
CSN: [id number]     Arrival date & time 07/26/12  1747 History     First MD Initiated Contact with Patient 07/26/12 1809     Chief Complaint  Patient presents with  . Chest Pain   (Consider location/radiation/quality/duration/timing/severity/associated sxs/prior Treatment) HPI This 67 year old male does not have any history of coronary artery disease was feeling fine until less than an hour prior to arrival he had sudden onset of a sudden sharp stabbing well localized nonradiating left anterior chest pain pleuritic worse with deep breathing and better if he stays perfectly still, he is no cough no fever no shortness breath no abdominal pain no back pain no radiation no associated symptoms except for sweating his pain is now improved he does not want pain medicine right now his pain has been constant for the last hour not completely resolved yet. There is no treatment prior to arrival. He has no recent immobilization or trauma. Past Medical History  Diagnosis Date  . HLD (hyperlipidemia)    Past Surgical History  Procedure Laterality Date  . Colonoscopy  01/06/2002    normal  . Appendectomy  1972  . Abdominal adhesion surgery  1976    [place]   Family History  Problem Relation Age of Onset  . Hyperlipidemia Brother   . Colon cancer Neg Hx    History  Substance Use Topics  . Smoking status: Never Smoker   . Smokeless tobacco: Never Used  . Alcohol Use: No    Review of Systems 10 Systems reviewed and are negative for acute change except as noted in the HPI. Allergies  Amoxicillin-pot clavulanate and Avelox  Home Medications   Current Outpatient Rx  Name  Route  Sig  Dispense  Refill  . Cholecalciferol (VITAMIN D3) 1000 UNITS CAPS   Oral   Take by mouth.         . metoCLOPramide (REGLAN) 10 MG tablet   Oral   Take 1 tablet (10 mg total) by mouth every 6 (six) hours as needed (nausea/headache).   6 tablet   0   . Multiple Vitamins-Minerals (CENTRUM SILVER PO)    Oral   Take by mouth.         . ondansetron (ZOFRAN ODT) 8 MG disintegrating tablet      8mg  ODT q4 hours prn nausea   4 tablet   0   . ondansetron (ZOFRAN ODT) 8 MG disintegrating tablet      8mg  ODT q8 hours prn nausea   4 tablet   0   . oxyCODONE-acetaminophen (PERCOCET) 5-325 MG per tablet   Oral   Take 2 tablets by mouth every 4 (four) hours as needed for pain.   10 tablet   0   . simvastatin (ZOCOR) 20 MG tablet   Oral   Take 20 mg by mouth every evening.         . tamsulosin (FLOMAX) 0.4 MG CAPS   Oral   Take 1 capsule (0.4 mg total) by mouth daily.   7 capsule   0    BP 128/82  Pulse 56  Temp(Src) 98.7 F (37.1 C) (Oral)  Resp 18  Ht 5\' 1"  (1.549 m)  Wt 140 lb (63.504 kg)  BMI 26.47 kg/m2  SpO2 99% Physical Exam  Nursing note and vitals reviewed. Constitutional:  Awake, alert, nontoxic appearance.  HENT:  Head: Atraumatic.  Eyes: Right eye exhibits no discharge. Left eye exhibits no discharge.  Neck: Neck supple.  Cardiovascular: Normal rate and  regular rhythm.   No murmur heard. Pulmonary/Chest: Effort normal and breath sounds normal. No respiratory distress. He has no wheezes. He has no rales. He exhibits tenderness.  Reproducible left lower anterior chest wall tenderness without rash or deformity noted  Abdominal: Soft. There is no tenderness. There is no rebound.  Musculoskeletal: He exhibits no edema and no tenderness.  Baseline ROM, no obvious new focal weakness.  Neurological: He is alert.  Mental status and motor strength appears baseline for patient and situation.  Skin: No rash noted.  Psychiatric: He has a normal mood and affect.    ED Course  ECG: Normal sinus rhythm, ventricular rate 67, normal axis, normal intervals, no acute ischemic changes noted, no comparison ECG immediately available Pt stable in ED with no significant deterioration in condition.Patient / Family / Caregiver informed of clinical course, understand medical  decision-making process, and agree with plan. Procedures (including critical care time)  Labs Reviewed  CBC WITH DIFFERENTIAL - Abnormal; Notable for the following:    Eosinophils Relative 6 (*)    All other components within normal limits  COMPREHENSIVE METABOLIC PANEL - Abnormal; Notable for the following:    Glucose, Bld 108 (*)    GFR calc non Af Amer 68 (*)    GFR calc Af Amer 78 (*)    All other components within normal limits  TROPONIN I  TROPONIN I  TROPONIN I  D-DIMER, QUANTITATIVE   Dg Chest 2 View  07/26/2012   *RADIOLOGY REPORT*  Clinical Data: Chest pain  CHEST - 2 VIEW  Comparison: 05/15/2012; CT abdomen and pelvis - 05/15/2012  Findings: Grossly unchanged cardiac silhouette and mediastinal contours.  A cardiac lead overlies the left upper lung.  No focal parenchymal opacities.  No pleural effusion or pneumothorax.  No definite evidence of edema.  Unchanged bones.  IMPRESSION: No acute cardiopulmonary disease.   Original Report Authenticated By: Tacey Ruiz, MD   1. Pleurisy   2. Chest wall pain     MDM  I doubt any other EMC precluding discharge at this time including, but not necessarily limited to the following:ACS, PE.  Hurman Horn, MD 07/27/12 [phone]

## 2012-07-26 NOTE — ED Notes (Signed)
MD at bedside. 

## 2012-07-26 NOTE — ED Notes (Signed)
Pt c/o cp x 1 hr ago, with Diaphoresis

## 2012-07-27 LAB — TROPONIN I: Troponin I: <0.3 ng/mL (ref <0.30)

## 2012-07-27 NOTE — ED Notes (Signed)
Pt d/c'd by Perry Mount, RN

## 2012-08-20 ENCOUNTER — Emergency Department (HOSPITAL_BASED_OUTPATIENT_CLINIC_OR_DEPARTMENT_OTHER)
Admission: EM | Admit: 2012-08-20 | Discharge: 2012-08-20 | Disposition: A | Discharge status: Home / Self Care | Payer: Medicare Other | Attending: Emergency Medicine | Admitting: Emergency Medicine

## 2012-08-20 ENCOUNTER — Encounter (HOSPITAL_BASED_OUTPATIENT_CLINIC_OR_DEPARTMENT_OTHER): Payer: Self-pay | Admitting: *Deleted

## 2012-08-20 DIAGNOSIS — R110 Nausea: Secondary | ICD-10-CM | POA: Diagnosis not present

## 2012-08-20 DIAGNOSIS — R109 Unspecified abdominal pain: Secondary | ICD-10-CM | POA: Diagnosis not present

## 2012-08-20 DIAGNOSIS — E785 Hyperlipidemia, unspecified: Secondary | ICD-10-CM | POA: Diagnosis not present

## 2012-08-20 DIAGNOSIS — N200 Calculus of kidney: Secondary | ICD-10-CM | POA: Diagnosis not present

## 2012-08-20 LAB — CBC WITH DIFFERENTIAL/PLATELET
Eosinophils Absolute: 0.3 10*3/uL (ref 0.0–0.7)
Eosinophils Relative: 4 % (ref 0–5)
Hemoglobin: 14.5 g/dL (ref 13.0–17.0)
Lymphs Abs: 2.6 10*3/uL (ref 0.7–4.0)
MCH: 31.5 pg (ref 26.0–34.0)
MCV: 93.9 fL (ref 78.0–100.0)
Monocytes Relative: 9 % (ref 3–12)
RBC: 4.61 MIL/uL (ref 4.22–5.81)

## 2012-08-20 LAB — URINALYSIS, ROUTINE W REFLEX MICROSCOPIC
Bilirubin Urine: NEGATIVE
Ketones, ur: NEGATIVE mg/dL
Protein, ur: 100 mg/dL — AB
Specific Gravity, Urine: 1.021 (ref 1.005–1.030)
Urobilinogen, UA: 0.2 mg/dL (ref 0.0–1.0)

## 2012-08-20 LAB — BASIC METABOLIC PANEL
BUN: 24 mg/dL — ABNORMAL HIGH (ref 6–23)
Calcium: 10.2 mg/dL (ref 8.4–10.5)
GFR calc non Af Amer: 46 mL/min — ABNORMAL LOW (ref >90)
Glucose, Bld: 109 mg/dL — ABNORMAL HIGH (ref 70–99)
Potassium: 4.2 mEq/L (ref 3.5–5.1)

## 2012-08-20 LAB — URINE MICROSCOPIC-ADD ON

## 2012-08-20 MED ORDER — OXYCODONE-ACETAMINOPHEN 5-325 MG PO TABS
1.0000 | ORAL_TABLET | Freq: Once | ORAL | Status: AC
  Administered 2012-08-20: 1 via ORAL
  Filled 2012-08-20 (×2): qty 1

## 2012-08-20 MED ORDER — OXYCODONE-ACETAMINOPHEN 5-325 MG PO TABS: 1.0000 | ORAL_TABLET | Freq: Four times a day (QID) | ORAL | Status: DC | PRN

## 2012-08-20 MED ORDER — ONDANSETRON HCL 4 MG PO TABS: 4.0000 mg | ORAL_TABLET | Freq: Four times a day (QID) | ORAL | Status: DC

## 2012-08-20 MED ORDER — ONDANSETRON HCL 4 MG/2ML IJ SOLN
4.0000 mg | Freq: Once | INTRAMUSCULAR | Status: AC
  Administered 2012-08-20: 4 mg via INTRAVENOUS
  Filled 2012-08-20: qty 2

## 2012-08-20 MED ORDER — MORPHINE SULFATE 4 MG/ML IJ SOLN
4.0000 mg | Freq: Once | INTRAMUSCULAR | Status: AC
  Administered 2012-08-20: 4 mg via INTRAVENOUS
  Filled 2012-08-20: qty 1

## 2012-08-20 MED ORDER — HYDROMORPHONE HCL PF 1 MG/ML IJ SOLN
1.0000 mg | Freq: Once | INTRAMUSCULAR | Status: AC
  Administered 2012-08-20: 1 mg via INTRAVENOUS
  Filled 2012-08-20: qty 1

## 2012-08-20 MED ORDER — TAMSULOSIN HCL 0.4 MG PO CAPS: 0.4000 mg | ORAL_CAPSULE | Freq: Every day | ORAL | Status: DC

## 2012-08-20 NOTE — ED Notes (Signed)
Pt sts pain is a 4/10 and that he can tolerate the pain at this level. Pt denies any further nausea and sts that he is ready to be d/c home.

## 2012-08-20 NOTE — ED Provider Notes (Addendum)
CSN: [id number]     Arrival date & time 08/20/12  1819 History     First MD Initiated Contact with Patient 08/20/12 1826     Chief Complaint  Patient presents with  . Nephrolithiasis   (Consider location/radiation/quality/duration/timing/severity/associated sxs/prior Treatment) Patient is a 67 y.o. male presenting with abdominal pain. The history is provided by the patient.  Abdominal Pain Pain location:  Suprapubic Pain quality: gnawing, shooting and stabbing   Pain radiates to:  Does not radiate Pain severity:  Severe Onset quality:  Sudden Duration: 3-4. Timing:  Constant Progression:  Unchanged Chronicity:  Recurrent Context comment:  Worse when trying to urinate Relieved by:  Nothing Worsened by:  Urination Ineffective treatments:  None tried Associated symptoms: nausea   Associated symptoms: no chest pain, no constipation, no cough, no fever, no hematuria, no shortness of breath and no vomiting   Risk factors comment:  Prior hx of [place]   Past Medical History  Diagnosis Date  . HLD (hyperlipidemia)    Past Surgical History  Procedure Laterality Date  . Colonoscopy  01/06/2002    normal  . Appendectomy  1972  . Abdominal adhesion surgery  1976    [place]   Family History  Problem Relation Age of Onset  . Hyperlipidemia Brother   . Colon cancer Neg Hx    History  Substance Use Topics  . Smoking status: Never Smoker   . Smokeless tobacco: Never Used  . Alcohol Use: No    Review of Systems  Constitutional: Negative for fever.  Respiratory: Negative for cough and shortness of breath.   Cardiovascular: Negative for chest pain.  Gastrointestinal: Positive for nausea and abdominal pain. Negative for vomiting and constipation.  Genitourinary: Negative for hematuria.  All other systems reviewed and are negative.    Allergies  Amoxicillin-pot clavulanate and Avelox  Home Medications   Current Outpatient Rx  Name  Route  Sig  Dispense  Refill  .  Cholecalciferol (VITAMIN D3) 1000 UNITS CAPS   Oral   Take by mouth.         . metoCLOPramide (REGLAN) 10 MG tablet   Oral   Take 1 tablet (10 mg total) by mouth every 6 (six) hours as needed (nausea/headache).   6 tablet   0   . Multiple Vitamins-Minerals (CENTRUM SILVER PO)   Oral   Take by mouth.         . ondansetron (ZOFRAN ODT) 8 MG disintegrating tablet      8mg  ODT q4 hours prn nausea   4 tablet   0   . ondansetron (ZOFRAN ODT) 8 MG disintegrating tablet      8mg  ODT q8 hours prn nausea   4 tablet   0   . oxyCODONE-acetaminophen (PERCOCET) 5-325 MG per tablet   Oral   Take 2 tablets by mouth every 4 (four) hours as needed for pain.   10 tablet   0   . simvastatin (ZOCOR) 20 MG tablet   Oral   Take 20 mg by mouth every evening.         . tamsulosin (FLOMAX) 0.4 MG CAPS   Oral   Take 1 capsule (0.4 mg total) by mouth daily.   7 capsule   0    BP 115/63  Pulse 77  Temp(Src) 97.9 F (36.6 C) (Oral)  Resp 18  Ht 5\' 8"  (1.727 m)  Wt 144 lb (65.318 kg)  BMI 21.9 kg/m2  SpO2 100% Physical Exam  Nursing note  and vitals reviewed. Constitutional: He is oriented to person, place, and time. He appears well-developed and well-nourished. No distress.  HENT:  Head: Normocephalic and atraumatic.  Mouth/Throat: Oropharynx is clear and moist.  Eyes: Conjunctivae and EOM are normal. Pupils are equal, round, and reactive to light.  Neck: Normal range of motion. Neck supple.  Cardiovascular: Normal rate, regular rhythm and intact distal pulses.   No murmur heard. Pulmonary/Chest: Effort normal and breath sounds normal. No respiratory distress. He has no wheezes. He has no rales.  Abdominal: Soft. He exhibits no distension. There is tenderness in the suprapubic area. There is no rebound, no guarding and no CVA tenderness.  Musculoskeletal: Normal range of motion. He exhibits no edema and no tenderness.  Neurological: He is alert and oriented to person, place,  and time.  Skin: Skin is warm and dry. No rash noted. No erythema.  Psychiatric: He has a normal mood and affect. His behavior is normal.    ED Course   Procedures (including critical care time)  Labs Reviewed  URINALYSIS, ROUTINE W REFLEX MICROSCOPIC - Abnormal; Notable for the following:    Hgb urine dipstick MODERATE (*)    Protein, ur 100 (*)    All other components within normal limits  BASIC METABOLIC PANEL - Abnormal; Notable for the following:    Glucose, Bld 109 (*)    BUN 24 (*)    Creatinine, Ser 1.50 (*)    GFR calc non Af Amer 46 (*)    GFR calc Af Amer 54 (*)    All other components within normal limits  URINE MICROSCOPIC-ADD ON - Abnormal; Notable for the following:    Bacteria, UA FEW (*)    All other components within normal limits  CBC WITH DIFFERENTIAL   No results found. 1. Kidney stone     MDM   Pt with symptoms consistent with kidney stone.  Denies infectious sx, or GI symptoms.  Low concern for diverticulitis and no risk factors or history suggestive of AAA.  No hx suggestive of GU source (discharge) and otherwise pt is healthy.  Pt had CT done in May of this year that showed left non-obstructing stones and a 3mm stone on the right.  Pt states pain today feels the same. Will treat pain and ensure no infection with UA, CBC, BMP.  7:37 PM Pt with blood in urine without infection.  Normal CBC and BMP with mild increase in Cr from 1.1 to 1.5.  Pt still having pain and will give second dose. 8:32 PM Pt's pain is now 4/10 and he wants to go home.  Will give pain meds, flomax and urology f/u.  Gwyneth Sprout, MD 08/20/12 2033  Gwyneth Sprout, MD 08/20/12 2034

## 2012-08-20 NOTE — ED Notes (Signed)
Patient having back pain, states that he does have stones in both kidneys

## 2012-08-20 NOTE — ED Notes (Signed)
MD at bedside. 

## 2012-08-28 DIAGNOSIS — Z23 Encounter for immunization: Secondary | ICD-10-CM | POA: Diagnosis not present

## 2012-08-28 DIAGNOSIS — N200 Calculus of kidney: Secondary | ICD-10-CM | POA: Diagnosis not present

## 2012-08-28 DIAGNOSIS — E7800 Pure hypercholesterolemia, unspecified: Secondary | ICD-10-CM | POA: Diagnosis not present

## 2012-08-28 DIAGNOSIS — R079 Chest pain, unspecified: Secondary | ICD-10-CM | POA: Diagnosis not present

## 2012-09-26 DIAGNOSIS — R6882 Decreased libido: Secondary | ICD-10-CM | POA: Diagnosis not present

## 2012-09-26 DIAGNOSIS — N401 Enlarged prostate with lower urinary tract symptoms: Secondary | ICD-10-CM | POA: Diagnosis not present

## 2012-09-26 DIAGNOSIS — N200 Calculus of kidney: Secondary | ICD-10-CM | POA: Diagnosis not present

## 2013-01-03 DIAGNOSIS — J40 Bronchitis, not specified as acute or chronic: Secondary | ICD-10-CM | POA: Diagnosis not present

## 2013-03-31 DIAGNOSIS — Z23 Encounter for immunization: Secondary | ICD-10-CM | POA: Diagnosis not present

## 2013-03-31 DIAGNOSIS — E7800 Pure hypercholesterolemia, unspecified: Secondary | ICD-10-CM | POA: Diagnosis not present

## 2013-03-31 DIAGNOSIS — Z0000 Encounter for general adult medical examination without abnormal findings: Secondary | ICD-10-CM | POA: Diagnosis not present

## 2013-03-31 DIAGNOSIS — Z79899 Other long term (current) drug therapy: Secondary | ICD-10-CM | POA: Diagnosis not present

## 2013-05-11 IMAGING — CT CT ABD-PELV W/ CM
2 of 5 series · 17 of 46 positions shown, 19 images · IV contrast (Omnipaque 300)
Comparison: 08/31/2003

CLINICAL DATA: Abdominal pain, nausea

CT ABDOMEN AND PELVIS WITH CONTRAST
TECHNIQUE: Multidetector CT imaging of the abdomen and pelvis was
performed following the standard protocol during bolus
administration of intravenous contrast.
Contrast: 100mL OMNIPAQUE IOHEXOL 300 MG/ML  SOLN

[Series 2: abd/ pel 5mm · axial · 0.66mm/px · z∈[-307,+103]mm · 14 of 92 slices shown, 16 images]
[im 5/92  soft-tissue]
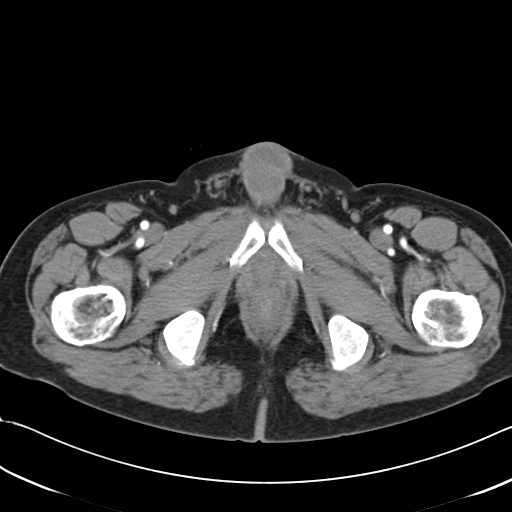
[im 5/92  bone]
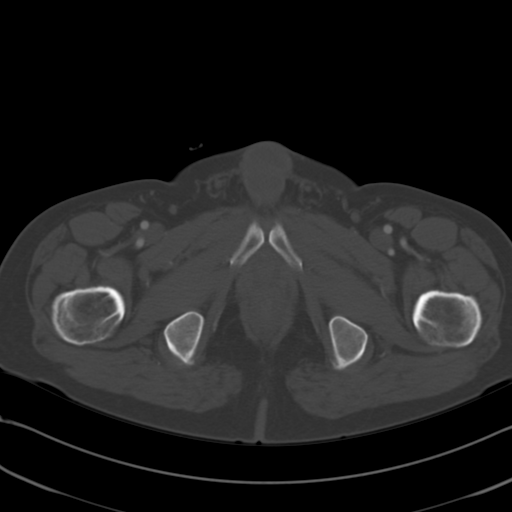
[im 10/92  soft-tissue]
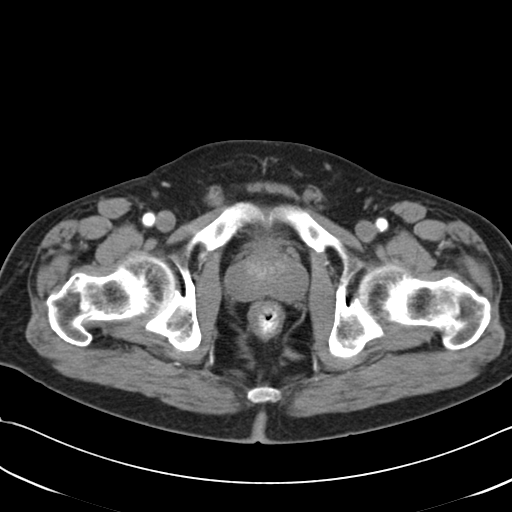
[im 20/92  soft-tissue]
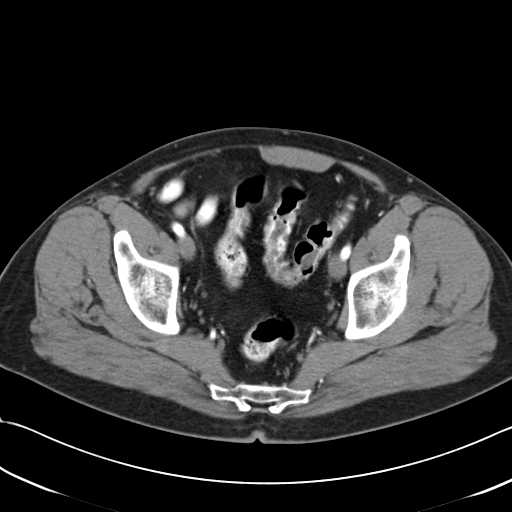
[im 24/92  soft-tissue]
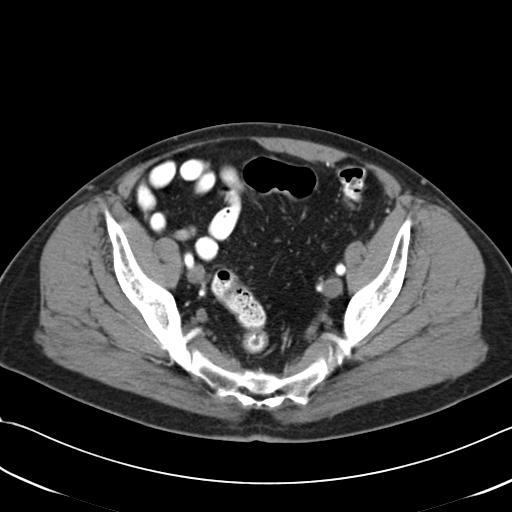
[im 29/92  soft-tissue]
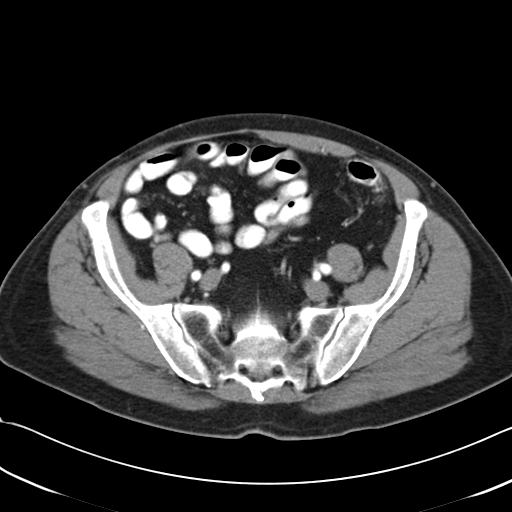
[im 39/92  soft-tissue]
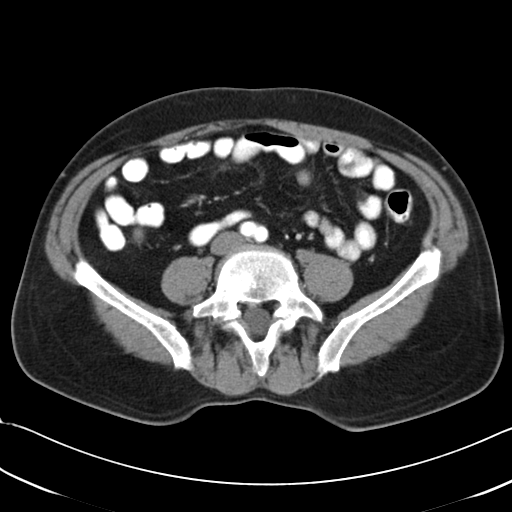
[im 44/92  soft-tissue]
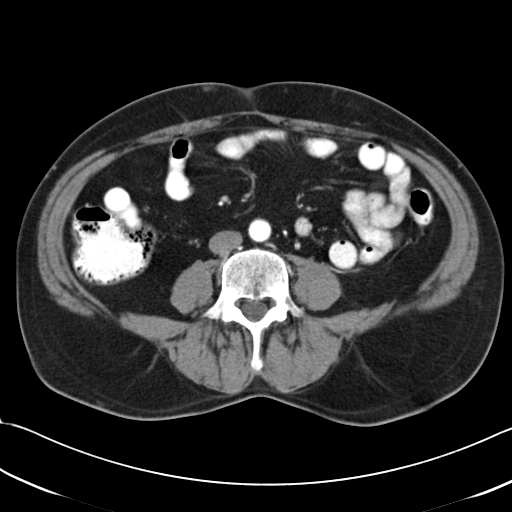
[im 48/92  soft-tissue]
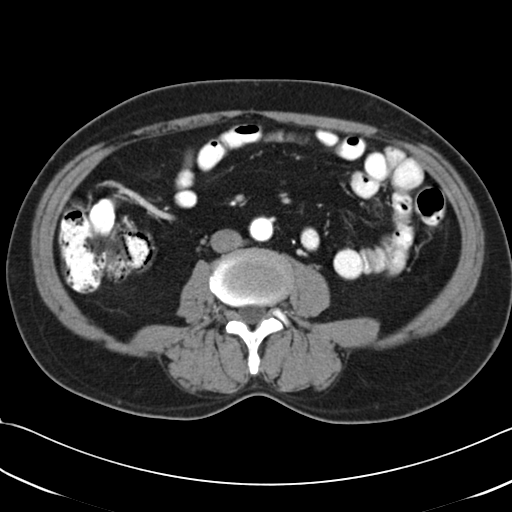
[im 53/92  soft-tissue]
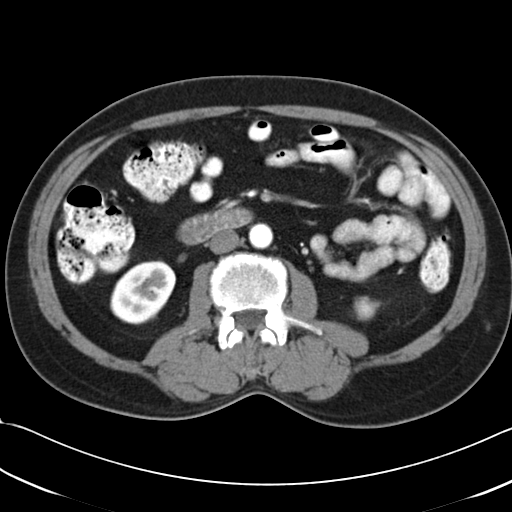
[im 53/92  bone]
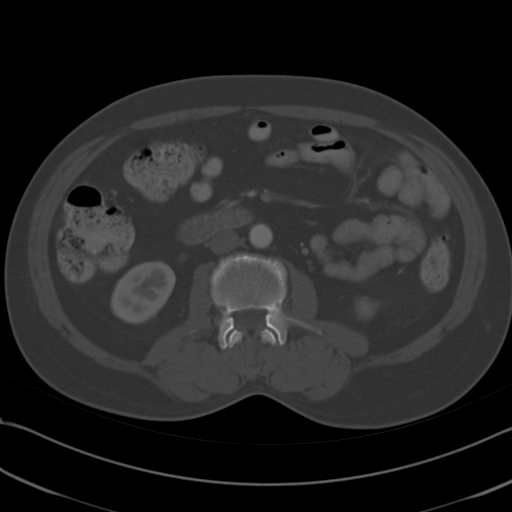
[im 63/92  soft-tissue]
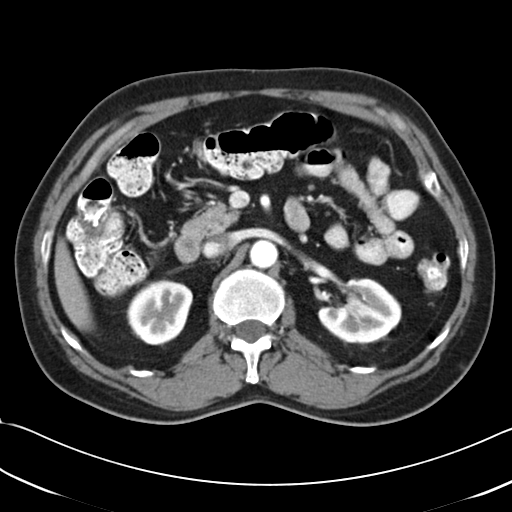
[im 68/92  soft-tissue]
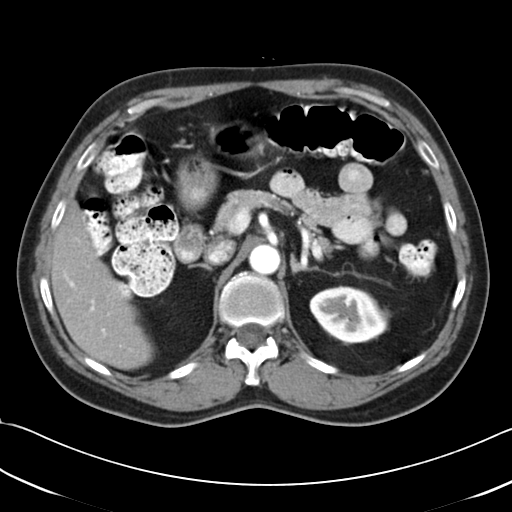
[im 72/92  soft-tissue]
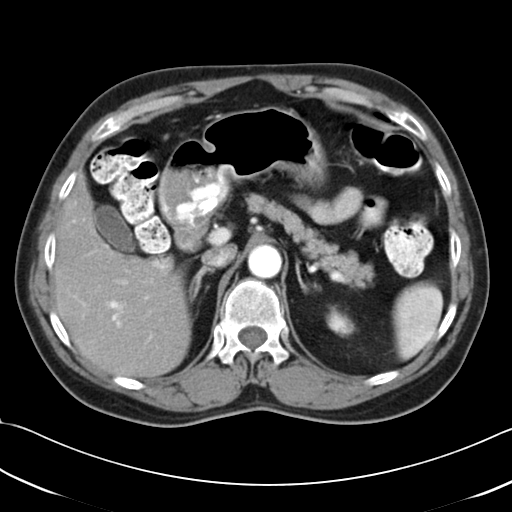
[im 82/92  soft-tissue]
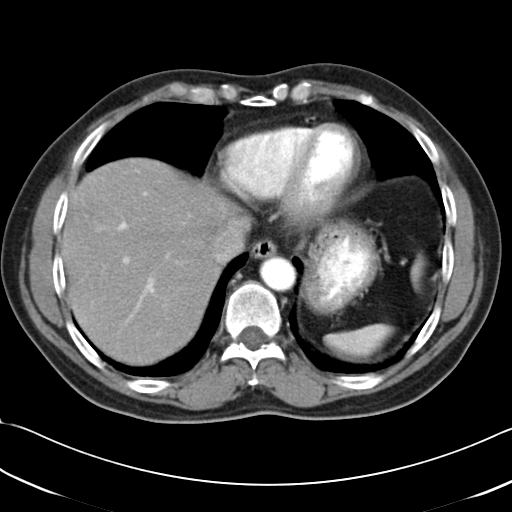
[im 87/92  soft-tissue]
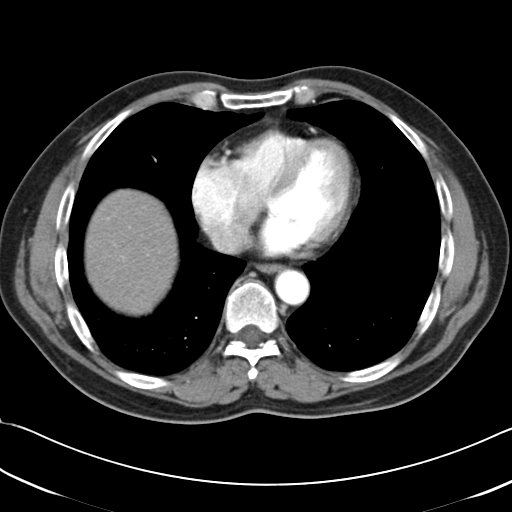

[Series 602: cor · coronal · 0.92mm/px · 3 of 119 slices shown]
[im 40/119  soft-tissue]
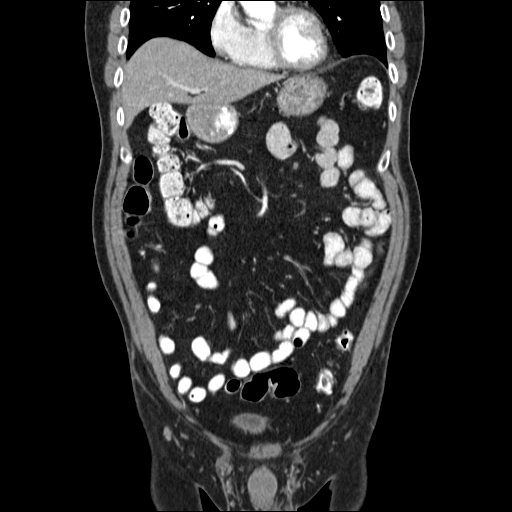
[im 53/119  soft-tissue]
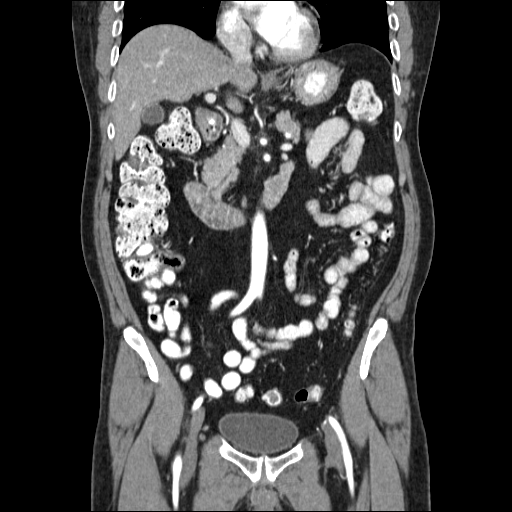
[im 66/119  soft-tissue]
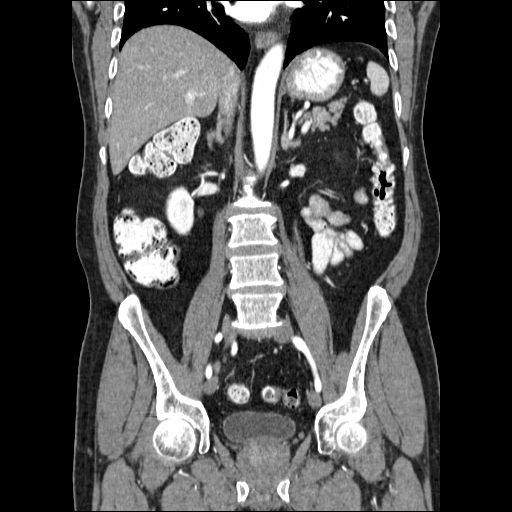

[17 of 46 positions shown; findings below may reference images not displayed]

FINDINGS: Dependent atelectasis posteriorly in the visualized lung
bases.  Unremarkable liver, nondistended gallbladder, spleen,
pancreas. Bilateral nephrolithiasis, with a 2 mm calculus in the
interpolar region left renal collecting system,  a 3 mm
calcification in the interpolar region of the right renal
collecting system.  No hydronephrosis.    Ureters decompressed
without calculus.  Urinary bladder physiologically distended.
Moderate prostatic enlargement.  No ascites.  No free air.  Stomach
and small bowel are nondistended.  The appendix is surgically
absent by history.  The colon is nondilated, unremarkable.  No
pelvic, retroperitoneal, or mesenteric adenopathy.  Portal vein is
patent. Normal bilateral renal excretion on delayed scans.
Bilateral L5 pars defects without anterolisthesis.  Disc
protrusions L4-5 and L5-S1.
IMPRESSION: 1. No acute abdominal process.
2.  Bilateral nephrolithiasis without ureteral calculus,
hydronephrosis, or ureterectasis.

## 2013-08-21 DIAGNOSIS — E7800 Pure hypercholesterolemia, unspecified: Secondary | ICD-10-CM | POA: Diagnosis not present

## 2013-08-21 DIAGNOSIS — Z0000 Encounter for general adult medical examination without abnormal findings: Secondary | ICD-10-CM | POA: Diagnosis not present

## 2013-08-21 DIAGNOSIS — Z23 Encounter for immunization: Secondary | ICD-10-CM | POA: Diagnosis not present

## 2013-08-21 DIAGNOSIS — Z125 Encounter for screening for malignant neoplasm of prostate: Secondary | ICD-10-CM | POA: Diagnosis not present

## 2013-10-12 DIAGNOSIS — Z23 Encounter for immunization: Secondary | ICD-10-CM | POA: Diagnosis not present

## 2013-11-19 DIAGNOSIS — R05 Cough: Secondary | ICD-10-CM | POA: Diagnosis not present

## 2014-04-13 DIAGNOSIS — Z0000 Encounter for general adult medical examination without abnormal findings: Secondary | ICD-10-CM | POA: Diagnosis not present

## 2014-04-13 DIAGNOSIS — Z79899 Other long term (current) drug therapy: Secondary | ICD-10-CM | POA: Diagnosis not present

## 2014-04-13 DIAGNOSIS — E780 Pure hypercholesterolemia: Secondary | ICD-10-CM | POA: Diagnosis not present

## 2014-04-13 DIAGNOSIS — Z125 Encounter for screening for malignant neoplasm of prostate: Secondary | ICD-10-CM | POA: Diagnosis not present

## 2014-09-10 DIAGNOSIS — R21 Rash and other nonspecific skin eruption: Secondary | ICD-10-CM | POA: Diagnosis not present

## 2014-10-12 DIAGNOSIS — Z23 Encounter for immunization: Secondary | ICD-10-CM | POA: Diagnosis not present

## 2014-11-19 DIAGNOSIS — H04123 Dry eye syndrome of bilateral lacrimal glands: Secondary | ICD-10-CM | POA: Diagnosis not present

## 2014-11-19 DIAGNOSIS — H5203 Hypermetropia, bilateral: Secondary | ICD-10-CM | POA: Diagnosis not present

## 2014-11-19 DIAGNOSIS — H52223 Regular astigmatism, bilateral: Secondary | ICD-10-CM | POA: Diagnosis not present

## 2014-11-19 DIAGNOSIS — H353131 Nonexudative age-related macular degeneration, bilateral, early dry stage: Secondary | ICD-10-CM | POA: Diagnosis not present

## 2014-11-19 DIAGNOSIS — H2513 Age-related nuclear cataract, bilateral: Secondary | ICD-10-CM | POA: Diagnosis not present

## 2014-11-19 DIAGNOSIS — H524 Presbyopia: Secondary | ICD-10-CM | POA: Diagnosis not present

## 2015-02-26 DIAGNOSIS — R05 Cough: Secondary | ICD-10-CM | POA: Diagnosis not present

## 2015-03-03 DIAGNOSIS — R05 Cough: Secondary | ICD-10-CM | POA: Diagnosis not present

## 2015-05-05 DIAGNOSIS — Z1211 Encounter for screening for malignant neoplasm of colon: Secondary | ICD-10-CM | POA: Diagnosis not present

## 2015-05-05 DIAGNOSIS — Z87442 Personal history of urinary calculi: Secondary | ICD-10-CM | POA: Diagnosis not present

## 2015-05-05 DIAGNOSIS — Z125 Encounter for screening for malignant neoplasm of prostate: Secondary | ICD-10-CM | POA: Diagnosis not present

## 2015-05-05 DIAGNOSIS — H6120 Impacted cerumen, unspecified ear: Secondary | ICD-10-CM | POA: Diagnosis not present

## 2015-05-05 DIAGNOSIS — Z0000 Encounter for general adult medical examination without abnormal findings: Secondary | ICD-10-CM | POA: Diagnosis not present

## 2015-05-05 DIAGNOSIS — E7800 Pure hypercholesterolemia, unspecified: Secondary | ICD-10-CM | POA: Diagnosis not present

## 2015-05-05 DIAGNOSIS — Z79899 Other long term (current) drug therapy: Secondary | ICD-10-CM | POA: Diagnosis not present

## 2015-05-06 DIAGNOSIS — H6120 Impacted cerumen, unspecified ear: Secondary | ICD-10-CM | POA: Diagnosis not present

## 2015-05-06 DIAGNOSIS — Z1211 Encounter for screening for malignant neoplasm of colon: Secondary | ICD-10-CM | POA: Diagnosis not present

## 2015-05-06 DIAGNOSIS — Z87442 Personal history of urinary calculi: Secondary | ICD-10-CM | POA: Diagnosis not present

## 2015-05-06 DIAGNOSIS — E7800 Pure hypercholesterolemia, unspecified: Secondary | ICD-10-CM | POA: Diagnosis not present

## 2015-05-06 DIAGNOSIS — Z79899 Other long term (current) drug therapy: Secondary | ICD-10-CM | POA: Diagnosis not present

## 2015-05-06 DIAGNOSIS — Z0000 Encounter for general adult medical examination without abnormal findings: Secondary | ICD-10-CM | POA: Diagnosis not present

## 2015-05-06 DIAGNOSIS — Z125 Encounter for screening for malignant neoplasm of prostate: Secondary | ICD-10-CM | POA: Diagnosis not present

## 2015-09-15 DIAGNOSIS — Z23 Encounter for immunization: Secondary | ICD-10-CM | POA: Diagnosis not present

## 2016-05-31 DIAGNOSIS — H5203 Hypermetropia, bilateral: Secondary | ICD-10-CM | POA: Diagnosis not present

## 2016-05-31 DIAGNOSIS — H524 Presbyopia: Secondary | ICD-10-CM | POA: Diagnosis not present

## 2016-05-31 DIAGNOSIS — H52223 Regular astigmatism, bilateral: Secondary | ICD-10-CM | POA: Diagnosis not present

## 2016-05-31 DIAGNOSIS — H2513 Age-related nuclear cataract, bilateral: Secondary | ICD-10-CM | POA: Diagnosis not present

## 2016-05-31 DIAGNOSIS — H353131 Nonexudative age-related macular degeneration, bilateral, early dry stage: Secondary | ICD-10-CM | POA: Diagnosis not present

## 2016-06-07 DIAGNOSIS — Z1211 Encounter for screening for malignant neoplasm of colon: Secondary | ICD-10-CM | POA: Diagnosis not present

## 2016-06-07 DIAGNOSIS — Z79899 Other long term (current) drug therapy: Secondary | ICD-10-CM | POA: Diagnosis not present

## 2016-06-07 DIAGNOSIS — N289 Disorder of kidney and ureter, unspecified: Secondary | ICD-10-CM | POA: Diagnosis not present

## 2016-06-07 DIAGNOSIS — Z87442 Personal history of urinary calculi: Secondary | ICD-10-CM | POA: Diagnosis not present

## 2016-06-07 DIAGNOSIS — E7800 Pure hypercholesterolemia, unspecified: Secondary | ICD-10-CM | POA: Diagnosis not present

## 2016-06-07 DIAGNOSIS — R972 Elevated prostate specific antigen [PSA]: Secondary | ICD-10-CM | POA: Diagnosis not present

## 2016-06-07 DIAGNOSIS — Z125 Encounter for screening for malignant neoplasm of prostate: Secondary | ICD-10-CM | POA: Diagnosis not present

## 2016-06-07 DIAGNOSIS — H6120 Impacted cerumen, unspecified ear: Secondary | ICD-10-CM | POA: Diagnosis not present

## 2016-06-07 DIAGNOSIS — Z0000 Encounter for general adult medical examination without abnormal findings: Secondary | ICD-10-CM | POA: Diagnosis not present

## 2016-06-09 ENCOUNTER — Other Ambulatory Visit: Payer: Self-pay | Admitting: Family Medicine

## 2016-06-09 DIAGNOSIS — N289 Disorder of kidney and ureter, unspecified: Secondary | ICD-10-CM

## 2016-06-19 ENCOUNTER — Other Ambulatory Visit: Payer: Medicare Other

## 2016-06-21 ENCOUNTER — Ambulatory Visit
Admission: RE | Admit: 2016-06-21 | Discharge: 2016-06-21 | Disposition: A | Discharge status: Home / Self Care | Payer: Medicare Other | Source: Ambulatory Visit | Attending: Family Medicine | Admitting: Family Medicine

## 2016-06-21 DIAGNOSIS — N289 Disorder of kidney and ureter, unspecified: Secondary | ICD-10-CM

## 2016-06-21 DIAGNOSIS — R944 Abnormal results of kidney function studies: Secondary | ICD-10-CM | POA: Diagnosis not present

## 2016-07-20 DIAGNOSIS — F4322 Adjustment disorder with anxiety: Secondary | ICD-10-CM | POA: Diagnosis not present

## 2016-08-22 DIAGNOSIS — N183 Chronic kidney disease, stage 3 (moderate): Secondary | ICD-10-CM | POA: Diagnosis not present

## 2016-08-22 DIAGNOSIS — N200 Calculus of kidney: Secondary | ICD-10-CM | POA: Diagnosis not present

## 2016-08-22 DIAGNOSIS — E7800 Pure hypercholesterolemia, unspecified: Secondary | ICD-10-CM | POA: Diagnosis not present

## 2016-08-22 DIAGNOSIS — N289 Disorder of kidney and ureter, unspecified: Secondary | ICD-10-CM | POA: Diagnosis not present

## 2016-10-25 DIAGNOSIS — Z23 Encounter for immunization: Secondary | ICD-10-CM | POA: Diagnosis not present

## 2017-03-08 DIAGNOSIS — H353131 Nonexudative age-related macular degeneration, bilateral, early dry stage: Secondary | ICD-10-CM | POA: Diagnosis not present

## 2017-03-08 DIAGNOSIS — H5203 Hypermetropia, bilateral: Secondary | ICD-10-CM | POA: Diagnosis not present

## 2017-03-08 DIAGNOSIS — H2513 Age-related nuclear cataract, bilateral: Secondary | ICD-10-CM | POA: Diagnosis not present

## 2017-03-08 DIAGNOSIS — H52223 Regular astigmatism, bilateral: Secondary | ICD-10-CM | POA: Diagnosis not present

## 2017-03-08 DIAGNOSIS — H524 Presbyopia: Secondary | ICD-10-CM | POA: Diagnosis not present

## 2017-10-03 DIAGNOSIS — Z1159 Encounter for screening for other viral diseases: Secondary | ICD-10-CM | POA: Diagnosis not present

## 2017-10-03 DIAGNOSIS — H6120 Impacted cerumen, unspecified ear: Secondary | ICD-10-CM | POA: Diagnosis not present

## 2017-10-03 DIAGNOSIS — N289 Disorder of kidney and ureter, unspecified: Secondary | ICD-10-CM | POA: Diagnosis not present

## 2017-10-03 DIAGNOSIS — Z79899 Other long term (current) drug therapy: Secondary | ICD-10-CM | POA: Diagnosis not present

## 2017-10-03 DIAGNOSIS — Z0000 Encounter for general adult medical examination without abnormal findings: Secondary | ICD-10-CM | POA: Diagnosis not present

## 2017-10-03 DIAGNOSIS — Z1211 Encounter for screening for malignant neoplasm of colon: Secondary | ICD-10-CM | POA: Diagnosis not present

## 2017-10-03 DIAGNOSIS — Z87442 Personal history of urinary calculi: Secondary | ICD-10-CM | POA: Diagnosis not present

## 2017-10-03 DIAGNOSIS — F439 Reaction to severe stress, unspecified: Secondary | ICD-10-CM | POA: Diagnosis not present

## 2017-10-03 DIAGNOSIS — Z23 Encounter for immunization: Secondary | ICD-10-CM | POA: Diagnosis not present

## 2017-10-03 DIAGNOSIS — R972 Elevated prostate specific antigen [PSA]: Secondary | ICD-10-CM | POA: Diagnosis not present

## 2017-10-03 DIAGNOSIS — E7800 Pure hypercholesterolemia, unspecified: Secondary | ICD-10-CM | POA: Diagnosis not present

## 2017-11-19 DIAGNOSIS — H6123 Impacted cerumen, bilateral: Secondary | ICD-10-CM | POA: Diagnosis not present

## 2018-03-09 DIAGNOSIS — J069 Acute upper respiratory infection, unspecified: Secondary | ICD-10-CM | POA: Diagnosis not present

## 2018-09-04 DIAGNOSIS — H5203 Hypermetropia, bilateral: Secondary | ICD-10-CM | POA: Diagnosis not present

## 2018-09-04 DIAGNOSIS — H52223 Regular astigmatism, bilateral: Secondary | ICD-10-CM | POA: Diagnosis not present

## 2018-09-04 DIAGNOSIS — H353131 Nonexudative age-related macular degeneration, bilateral, early dry stage: Secondary | ICD-10-CM | POA: Diagnosis not present

## 2018-09-04 DIAGNOSIS — H2513 Age-related nuclear cataract, bilateral: Secondary | ICD-10-CM | POA: Diagnosis not present

## 2018-09-04 DIAGNOSIS — H524 Presbyopia: Secondary | ICD-10-CM | POA: Diagnosis not present

## 2018-09-23 DIAGNOSIS — Z23 Encounter for immunization: Secondary | ICD-10-CM | POA: Diagnosis not present

## 2018-10-30 DIAGNOSIS — R413 Other amnesia: Secondary | ICD-10-CM | POA: Diagnosis not present

## 2018-10-30 DIAGNOSIS — Z79899 Other long term (current) drug therapy: Secondary | ICD-10-CM | POA: Diagnosis not present

## 2018-10-30 DIAGNOSIS — H6123 Impacted cerumen, bilateral: Secondary | ICD-10-CM | POA: Diagnosis not present

## 2018-10-30 DIAGNOSIS — R972 Elevated prostate specific antigen [PSA]: Secondary | ICD-10-CM | POA: Diagnosis not present

## 2018-10-30 DIAGNOSIS — Z0000 Encounter for general adult medical examination without abnormal findings: Secondary | ICD-10-CM | POA: Diagnosis not present

## 2018-10-30 DIAGNOSIS — Z87442 Personal history of urinary calculi: Secondary | ICD-10-CM | POA: Diagnosis not present

## 2018-10-30 DIAGNOSIS — N289 Disorder of kidney and ureter, unspecified: Secondary | ICD-10-CM | POA: Diagnosis not present

## 2018-10-30 DIAGNOSIS — E7800 Pure hypercholesterolemia, unspecified: Secondary | ICD-10-CM | POA: Diagnosis not present

## 2018-10-30 DIAGNOSIS — H6120 Impacted cerumen, unspecified ear: Secondary | ICD-10-CM | POA: Diagnosis not present

## 2018-10-30 DIAGNOSIS — Z1211 Encounter for screening for malignant neoplasm of colon: Secondary | ICD-10-CM | POA: Diagnosis not present

## 2018-11-20 ENCOUNTER — Encounter: Payer: Self-pay | Admitting: Diagnostic Neuroimaging

## 2018-12-03 DIAGNOSIS — R972 Elevated prostate specific antigen [PSA]: Secondary | ICD-10-CM | POA: Diagnosis not present

## 2018-12-24 ENCOUNTER — Other Ambulatory Visit: Payer: Self-pay | Admitting: Nephrology

## 2018-12-24 DIAGNOSIS — N1831 Chronic kidney disease, stage 3a: Secondary | ICD-10-CM

## 2018-12-30 ENCOUNTER — Ambulatory Visit: Payer: Medicare Other | Admitting: Diagnostic Neuroimaging

## 2019-01-02 ENCOUNTER — Ambulatory Visit
Admission: RE | Admit: 2019-01-02 | Discharge: 2019-01-02 | Disposition: A | Discharge status: Home / Self Care | Payer: Medicare Other | Source: Ambulatory Visit | Attending: Nephrology | Admitting: Nephrology

## 2019-01-02 DIAGNOSIS — N1831 Chronic kidney disease, stage 3a: Secondary | ICD-10-CM

## 2019-01-14 DIAGNOSIS — C61 Malignant neoplasm of prostate: Secondary | ICD-10-CM | POA: Diagnosis not present

## 2019-01-22 ENCOUNTER — Ambulatory Visit: Payer: Medicare Other | Admitting: Diagnostic Neuroimaging

## 2019-01-22 ENCOUNTER — Other Ambulatory Visit: Payer: Self-pay | Admitting: Urology

## 2019-01-22 ENCOUNTER — Other Ambulatory Visit (HOSPITAL_COMMUNITY): Payer: Self-pay | Admitting: Urology

## 2019-01-22 DIAGNOSIS — C61 Malignant neoplasm of prostate: Secondary | ICD-10-CM

## 2019-01-30 DIAGNOSIS — C61 Malignant neoplasm of prostate: Secondary | ICD-10-CM | POA: Diagnosis not present

## 2019-01-31 ENCOUNTER — Encounter (HOSPITAL_COMMUNITY)
Admission: RE | Admit: 2019-01-31 | Discharge: 2019-01-31 | Disposition: A | Discharge status: Home / Self Care | Payer: Medicare Other | Source: Ambulatory Visit | Attending: Urology | Admitting: Urology

## 2019-01-31 ENCOUNTER — Other Ambulatory Visit: Payer: Self-pay

## 2019-01-31 DIAGNOSIS — C61 Malignant neoplasm of prostate: Secondary | ICD-10-CM | POA: Insufficient documentation

## 2019-01-31 DIAGNOSIS — R972 Elevated prostate specific antigen [PSA]: Secondary | ICD-10-CM | POA: Diagnosis not present

## 2019-01-31 DIAGNOSIS — Z8546 Personal history of malignant neoplasm of prostate: Secondary | ICD-10-CM | POA: Diagnosis not present

## 2019-01-31 MED ORDER — TECHNETIUM TC 99M MEDRONATE IV KIT
8.7700 | PACK | Freq: Once | INTRAVENOUS | Status: AC
  Administered 2019-01-31: 21.9 via INTRAVENOUS

## 2019-02-01 DIAGNOSIS — Z23 Encounter for immunization: Secondary | ICD-10-CM | POA: Diagnosis not present

## 2019-02-06 DIAGNOSIS — C61 Malignant neoplasm of prostate: Secondary | ICD-10-CM | POA: Diagnosis not present

## 2019-02-07 ENCOUNTER — Encounter: Payer: Self-pay | Admitting: *Deleted

## 2019-02-18 ENCOUNTER — Encounter: Payer: Self-pay | Admitting: Urology

## 2019-02-18 ENCOUNTER — Ambulatory Visit
Admission: RE | Admit: 2019-02-18 | Discharge: 2019-02-18 | Disposition: A | Discharge status: Home / Self Care | Payer: Medicare Other | Source: Ambulatory Visit | Attending: Radiation Oncology | Admitting: Radiation Oncology

## 2019-02-18 ENCOUNTER — Other Ambulatory Visit: Payer: Self-pay

## 2019-02-18 ENCOUNTER — Encounter: Payer: Self-pay | Admitting: Radiation Oncology

## 2019-02-18 VITALS — Ht 67.0 in | Wt 139.0 lb

## 2019-02-18 DIAGNOSIS — C61 Malignant neoplasm of prostate: Secondary | ICD-10-CM | POA: Insufficient documentation

## 2019-02-18 DIAGNOSIS — R972 Elevated prostate specific antigen [PSA]: Secondary | ICD-10-CM | POA: Diagnosis not present

## 2019-02-18 HISTORY — DX: Malignant neoplasm of prostate: C61

## 2019-02-18 NOTE — Progress Notes (Signed)
Radiation Oncology         [phone] ________________________________  Initial outpatient Consultation - Conducted via Telephone due to current COVID-19 concerns for limiting patient exposure  Name: Shawn Walsh MRN: [id number]  Date: 02/18/2019  DOB: January 06, 1945  [id number], Lennette Bihari, MD  Festus Aloe, MD   REFERRING PHYSICIAN: Festus Aloe, MD  DIAGNOSIS: 74 y.o. gentleman with Stage T1c adenocarcinoma of the prostate with Gleason score of 4+5, and PSA of 11.9.    ICD-10-CM   1. Malignant neoplasm of prostate ([place])  C61     HISTORY OF PRESENT ILLNESS: Shawn Walsh is a 74 y.o. male with a diagnosis of prostate cancer. He was noted to have an elevated PSA of 10.5 by his primary care physician, Dr. Rex Kras.  Apparently this has been steadily rising over the years-- 11/2009 - 1.17 05/2015 - 3.5 06/2016 - 7.6 10/2017 - 7.4 10/2018 - 10.5  He states that he had been seen by urology in the past but not for his prostate. Accordingly, he was referred for evaluation in urology by Dr. Junious Silk on 12/03/2018.  Repeat PSA the following day was further elevated at 11.9.  Therefore, the patient proceeded to transrectal ultrasound with 12 biopsies of the prostate on 01/14/2019.  The prostate volume measured 35.5 cc.  Out of 12 core biopsies, 5 were positive.  The maximum Gleason score was 4+5, and this was seen in left base. Gleason 4+4 was seen in right mid, Gleason 4+3 in right base, and small foci of Gleason 3+3 in left apex lateral and left mid.  A CT A/P was performed on 01/30/2019 showing no findings of pathologic lymphadenopathy or visceral or osseous metastatic disease. Bone scan performed the following day was also negative for osseous metastases.  The patient reviewed the biopsy results with his urologist and he has kindly been referred today for discussion of potential radiation treatment options. Marland Kitchen   PREVIOUS RADIATION THERAPY: No  PAST MEDICAL HISTORY:  Past Medical  History:  Diagnosis Date  . HLD (hyperlipidemia)   . Prostate cancer ([place])       PAST SURGICAL HISTORY: Past Surgical History:  Procedure Laterality Date  . ABDOMINAL ADHESION SURGERY  1976   [place]  . APPENDECTOMY  1972  . COLONOSCOPY  01/06/2002   normal    FAMILY HISTORY:  Family History  Problem Relation Age of Onset  . Hyperlipidemia Brother   . Colon cancer Neg Hx   . Prostate cancer Neg Hx   . Pancreatic cancer Neg Hx   . Breast cancer Neg Hx     SOCIAL HISTORY:  Social History   Socioeconomic History  . Marital status: Married    Spouse name: Not on file  . Number of children: 3  . Years of education: Not on file  . Highest education level: Not on file  Occupational History  . Occupation: Insurance underwriter   Tobacco Use  . Smoking status: Never Smoker  . Smokeless tobacco: Never Used  Substance and Sexual Activity  . Alcohol use: No  . Drug use: No  . Sexual activity: Not Currently  Other Topics Concern  . Not on file  Social History Narrative   Designer, television/film set.  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   Vegetarian.   Social Determinants of Health   Financial Resource Strain:   . Difficulty of Paying Living Expenses: Not on file  Food Insecurity:   . Worried About Charity fundraiser in the Last Year: Not on  file  . [place] in the Last Year: Not on file  Transportation Needs:   . Lack of Transportation (Medical): Not on file  . Lack of Transportation (Non-Medical): Not on file  Physical Activity:   . Days of Exercise per Week: Not on file  . Minutes of Exercise per Session: Not on file  Stress:   . Feeling of Stress : Not on file  Social Connections:   . Frequency of Communication with Friends and Family: Not on file  . Frequency of Social Gatherings with Friends and Family: Not on file  . Attends Religious Services: Not on file  . Active Member of Clubs or Organizations: Not on file  . Attends Archivist  Meetings: Not on file  . Marital Status: Not on file  Intimate Partner Violence:   . Fear of Current or Ex-Partner: Not on file  . Emotionally Abused: Not on file  . Physically Abused: Not on file  . Sexually Abused: Not on file    ALLERGIES: Amoxicillin-pot clavulanate and Avelox [moxifloxacin hcl in nacl]  MEDICATIONS:  Current Outpatient Medications  Medication Sig Dispense Refill  . Cholecalciferol (VITAMIN D3) 1000 UNITS CAPS Take by mouth.    . fenofibrate 160 MG tablet     . Multiple Vitamins-Minerals (CENTRUM SILVER PO) Take by mouth.    . rosuvastatin (CRESTOR) 5 MG tablet      No current facility-administered medications for this encounter.    REVIEW OF SYSTEMS:  On review of systems, the patient reports that he is doing well overall. He denies any chest pain, shortness of breath, cough, fevers, chills, night sweats, unintended weight changes. He denies any bowel disturbances, and denies abdominal pain, nausea or vomiting. He denies any new musculoskeletal or joint aches or pains. His IPSS was 0, indicating no urinary symptoms. His SHIM was 5, indicating he has severe erectile dysfunction. A complete review of systems is obtained and is otherwise negative.    PHYSICAL EXAM:  Wt Readings from Last 3 Encounters:  02/18/19 139 lb (63 kg)  08/20/12 144 lb (65.3 kg)  07/26/12 140 lb (63.5 kg)   Temp Readings from Last 3 Encounters:  08/20/12 97.9 F (36.6 C) (Oral)  07/26/12 98.7 F (37.1 C) (Oral)  05/15/12 98.5 F (36.9 C) (Oral)   BP Readings from Last 3 Encounters:  08/20/12 115/72  07/27/12 128/82  05/15/12 140/81   Pulse Readings from Last 3 Encounters:  08/20/12 66  07/27/12 (!) 56  05/15/12 72   Pain Assessment Pain Score: 0-No pain/10  Physical exam not performed in light of telephone consult visit format.   KPS = 100  100 - Normal; no complaints; no evidence of disease. 90   - Able to carry on normal activity; minor signs or symptoms of  disease. 80   - Normal activity with effort; some signs or symptoms of disease. 30   - Cares for self; unable to carry on normal activity or to do active work. 60   - Requires occasional assistance, but is able to care for most of his personal needs. 50   - Requires considerable assistance and frequent medical care. 31   - Disabled; requires special care and assistance. 13   - Severely disabled; hospital admission is indicated although death not imminent. 23   - Very sick; hospital admission necessary; active supportive treatment necessary. 10   - Moribund; fatal processes progressing rapidly. 0     - Dead  Karnofsky DA,  Abelmann [place], Craver LS and [facility] [phone]) The use of the nitrogen mustards in the palliative treatment of carcinoma: with particular reference to bronchogenic carcinoma Cancer 1 634-56  LABORATORY DATA:  Lab Results  Component Value Date   WBC 9.5 08/20/2012   HGB 14.5 08/20/2012   HCT 43.3 08/20/2012   MCV 93.9 08/20/2012   PLT 217 08/20/2012   Lab Results  Component Value Date   NA 141 08/20/2012   K 4.2 08/20/2012   CL 103 08/20/2012   CO2 29 08/20/2012   Lab Results  Component Value Date   ALT 15 07/26/2012   AST 17 07/26/2012   ALKPHOS 91 07/26/2012   BILITOT 0.7 07/26/2012     RADIOGRAPHY: NM Bone Scan Whole Body  Result Date: 01/31/2019 CLINICAL DATA:  History of prostate cancer with PSA [id number]. Denies bone pain or recent trauma. EXAM: NUCLEAR MEDICINE WHOLE BODY BONE SCAN TECHNIQUE: Whole body anterior and posterior images were obtained approximately 3 hours after intravenous injection of radiopharmaceutical. RADIOPHARMACEUTICALS:  21.9 mCi Technetium-42m MDP IV COMPARISON:  CT abdomen/pelvis 01/30/2019 FINDINGS: Normal distribution of radiotracer within the bones, soft tissues and genitourinary system. No focal osseous uptake to suggest metastatic disease. IMPRESSION: No evidence of metastatic disease. Electronically Signed   By: Marin Olp  M.D.   On: 01/31/2019 19:56      IMPRESSION/PLAN: This visit was conducted via Telephone to spare the patient unnecessary potential exposure in the healthcare setting during the current COVID-19 pandemic. 1. 74 y.o. gentleman with Stage T1c adenocarcinoma of the prostate with Gleason Score of 4+5, and PSA of 11.9. We discussed the patient's workup and outlined the nature of prostate cancer in this setting. The patient's T stage, Gleason's score, and PSA put him into the high risk group. Accordingly, he is eligible for a variety of potential treatment options including LT-ADT in combination with either 8 weeks of external radiation or a brachytherapy boost followed by 5 weeks of external radiation. We discussed the available radiation techniques, and focused on the details and logistics of delivery. We discussed and outlined the risks, benefits, short and long-term effects associated with radiotherapy and compared and contrasted these with prostatectomy. We discussed the role of SpaceOAR gel in reducing the rectal toxicity associated with radiotherapy. We also detailed the role of ADT in the treatment of high risk prostate cancer and outlined the associated side effects that could be expected with this therapy.  At the end of the conversation, the patient is interested in moving forward with LT-ADT concurrent with a brachytherapy boost with SpaceOAR gel placement to reduce rectal toxicity from radiotherapy followed by a 5 week course of daily prostate IMRT. He has not received his first Lupron injection. We will share our discussion with Dr. Junious Silk and move forward with coordinating a follow up visit to start ADT, first available. Once we know the date for starting ADT,he will be contacted by Romie Jumper to begin coordinating his brachytherapy boost procedure approximately 2 months thereafter, with plans to begin his 5 week course of daily radiotherapy 3 weeks after his seed boost procedure. We will  contact the pharmaceutical rep to ensure that Lucan is available at the time of procedure.  He will have a prostate MRI following his post-seed CT SIM to confirm appropriate distribution of the [place]. We enjoyed meeting him and his wife today and look forward to participating in his care.   Given current concerns for patient exposure during the COVID-19 pandemic, this encounter was  conducted via telephone. The patient was notified in advance and was offered a MyChart meeting to allow for face to face communication but unfortunately reported that he did not have the appropriate resources/technology to support such a visit and instead preferred to proceed with telephone consult. The patient has given verbal consent for this type of encounter. The time spent during this encounter was 60 minutes. The attendants for this meeting include Tyler Pita MD, Ashlyn Bruning PA-C, Wilburn Mylar- scribe, patient, Algia Trapasso and his wife. During the encounter, Tyler Pita MD, Ashlyn Bruning PA-C, and scribe, Wilburn Mylar were located at [place].  Patient, Braeton Chaires and his wife were located at home.    Nicholos Johns, PA-C    Tyler Pita, MD  [facility] Direct Dial: [phone]  Fax: [phone] [email or web address]  Skype  [email or web address]  This document serves as a record of services personally performed by Tyler Pita, MD and Freeman Caldron, PA-C. It was created on their behalf by Wilburn Mylar, a trained medical scribe. The creation of this record is based on the scribe's personal observations and the provider's statements to them. This document has been checked and approved by the attending provider.

## 2019-02-18 NOTE — Progress Notes (Signed)
See progress noted under physician encounter.  

## 2019-02-18 NOTE — Progress Notes (Signed)
GU Location of Tumor / Histology: prostatic adencarcinoma  If Prostate Cancer, Gleason Score is (4 + 5) and PSA is (11.9). Prostate volume: 35.52  Shawn Walsh was referred by Dr. Hulan Fess, MD to Dr. Junious Silk for further evaluation of an elevated PSA.     Biopsies of prostate (if applicable) revealed:    Past/Anticipated interventions by urology, if any: prostate biopsy, CT and bone scan negative, referred to Dr. Tammi Klippel to discuss radiation therapy option.  [name] encouraging LT ADT and IMRT +/- brachy.  Past/Anticipated interventions by medical oncology, if any: no  Weight changes, if any: no  Bowel/Bladder complaints, if any: IPSS   0 . SHIM    5 . Denies dysuria, hematuria, urinary leakage or incontinence. Denies any bowel complaints.   Nausea/Vomiting, if any: no  Pain issues, if any:  no  SAFETY ISSUES:  Prior radiation? no  Pacemaker/ICD? no  Possible current pregnancy? no, male patient  Is the patient on methotrexate? no  Current Complaints / other details:  74 year old male. Married.

## 2019-02-21 ENCOUNTER — Telehealth: Payer: Self-pay | Admitting: *Deleted

## 2019-02-21 NOTE — Telephone Encounter (Signed)
Called patient to inform of ADT appt. on 03-12-19 - arrival time- 8 am @ Dr. Lyndal Rainbow Office, spoke with patient and he is aware of this appt.

## 2019-02-26 ENCOUNTER — Encounter: Payer: Self-pay | Admitting: Medical Oncology

## 2019-02-26 DIAGNOSIS — Z5111 Encounter for antineoplastic chemotherapy: Secondary | ICD-10-CM | POA: Diagnosis not present

## 2019-02-26 DIAGNOSIS — C61 Malignant neoplasm of prostate: Secondary | ICD-10-CM | POA: Diagnosis not present

## 2019-03-01 DIAGNOSIS — Z23 Encounter for immunization: Secondary | ICD-10-CM | POA: Diagnosis not present

## 2019-03-12 DIAGNOSIS — Z20828 Contact with and (suspected) exposure to other viral communicable diseases: Secondary | ICD-10-CM | POA: Diagnosis not present

## 2019-03-14 ENCOUNTER — Other Ambulatory Visit: Payer: Self-pay | Admitting: Urology

## 2019-04-16 ENCOUNTER — Telehealth: Payer: Self-pay | Admitting: *Deleted

## 2019-04-16 NOTE — Telephone Encounter (Signed)
CALLED PATIENT TO REMIND OF PRE-SEED APPTS. FOR 04-17-19, SPOKE WITH PATIENT AND HE IS AWARE OF THESE APPTS.

## 2019-04-17 ENCOUNTER — Ambulatory Visit
Admission: RE | Admit: 2019-04-17 | Discharge: 2019-04-17 | Disposition: A | Discharge status: Home / Self Care | Payer: Medicare Other | Source: Ambulatory Visit | Attending: Urology | Admitting: Urology

## 2019-04-17 ENCOUNTER — Ambulatory Visit (HOSPITAL_COMMUNITY)
Admission: RE | Admit: 2019-04-17 | Discharge: 2019-04-17 | Disposition: A | Discharge status: Home / Self Care | Payer: Medicare Other | Source: Ambulatory Visit | Attending: Urology | Admitting: Urology

## 2019-04-17 ENCOUNTER — Ambulatory Visit
Admission: RE | Admit: 2019-04-17 | Discharge: 2019-04-17 | Disposition: A | Discharge status: Home / Self Care | Payer: Medicare Other | Source: Ambulatory Visit | Attending: Radiation Oncology | Admitting: Radiation Oncology

## 2019-04-17 ENCOUNTER — Other Ambulatory Visit: Payer: Self-pay

## 2019-04-17 ENCOUNTER — Encounter: Payer: Self-pay | Admitting: Medical Oncology

## 2019-04-17 ENCOUNTER — Other Ambulatory Visit: Payer: Self-pay | Admitting: Urology

## 2019-04-17 ENCOUNTER — Encounter (HOSPITAL_COMMUNITY)
Admission: RE | Admit: 2019-04-17 | Discharge: 2019-04-17 | Disposition: A | Discharge status: Home / Self Care | Payer: Medicare Other | Source: Ambulatory Visit | Attending: Urology | Admitting: Urology

## 2019-04-17 DIAGNOSIS — C61 Malignant neoplasm of prostate: Secondary | ICD-10-CM

## 2019-04-18 NOTE — Progress Notes (Signed)
  Radiation Oncology         [phone] ________________________________  Name: Shiheem Koleszar MRN: [id number]  Date: 04/17/2019  DOB: [birth year]  SIMULATION AND TREATMENT PLANNING NOTE PUBIC ARCH STUDY  [id number], Lennette Bihari, MD  Festus Aloe, MD  DIAGNOSIS: 74 y.o. gentleman with Stage T1c adenocarcinoma of the prostate with Gleason score of 4+5, and PSA of 11.9    ICD-10-CM   1. Malignant neoplasm of prostate ([place])  C61     COMPLEX SIMULATION:  The patient presented today for evaluation for possible prostate seed implant. He was brought to the radiation planning suite and placed supine on the CT couch. A 3-dimensional image study set was obtained in upload to the planning computer. There, on each axial slice, I contoured the prostate gland. Then, using three-dimensional radiation planning tools I reconstructed the prostate in view of the structures from the transperineal needle pathway to assess for possible pubic arch interference. In doing so, I did not appreciate any pubic arch interference. Also, the patient's prostate volume was estimated based on the drawn structure. The volume was 35 cc.  Given the pubic arch appearance and prostate volume, patient remains a good candidate to proceed with prostate seed implant. Today, he freely provided informed written consent to proceed.    PLAN: The patient will undergo prostate seed implant.   ________________________________  Sheral Apley. Tammi Klippel, M.D.

## 2019-04-29 DIAGNOSIS — Z0000 Encounter for general adult medical examination without abnormal findings: Secondary | ICD-10-CM | POA: Diagnosis not present

## 2019-04-29 DIAGNOSIS — Z79899 Other long term (current) drug therapy: Secondary | ICD-10-CM | POA: Diagnosis not present

## 2019-04-29 DIAGNOSIS — N289 Disorder of kidney and ureter, unspecified: Secondary | ICD-10-CM | POA: Diagnosis not present

## 2019-04-29 DIAGNOSIS — Z1211 Encounter for screening for malignant neoplasm of colon: Secondary | ICD-10-CM | POA: Diagnosis not present

## 2019-04-29 DIAGNOSIS — R413 Other amnesia: Secondary | ICD-10-CM | POA: Diagnosis not present

## 2019-04-29 DIAGNOSIS — R972 Elevated prostate specific antigen [PSA]: Secondary | ICD-10-CM | POA: Diagnosis not present

## 2019-04-29 DIAGNOSIS — E7800 Pure hypercholesterolemia, unspecified: Secondary | ICD-10-CM | POA: Diagnosis not present

## 2019-04-29 DIAGNOSIS — H6120 Impacted cerumen, unspecified ear: Secondary | ICD-10-CM | POA: Diagnosis not present

## 2019-04-29 DIAGNOSIS — Z87442 Personal history of urinary calculi: Secondary | ICD-10-CM | POA: Diagnosis not present

## 2019-05-05 ENCOUNTER — Other Ambulatory Visit: Payer: Self-pay

## 2019-05-05 ENCOUNTER — Encounter (HOSPITAL_BASED_OUTPATIENT_CLINIC_OR_DEPARTMENT_OTHER): Payer: Self-pay | Admitting: Urology

## 2019-05-05 NOTE — Progress Notes (Signed)
Spoke w/ via phone for pre-op interview--- PT Lab needs dos----  no             Lab results------ gettting CBC, CMP, PT/PTT done 05-09-2019 @ 1100;  Current EKG/ CXR done 04-17-2019 in epic/ chart COVID test ------ 05-09-2019 @ 1145 Arrive at ------- 1115 NPO after ------ MN w/ exception clear liquids until 0730 then nothing by mouth (no cream/ milk products) Medications to take morning of surgery ----- NONE Diabetic medication ----- n/a Patient Special Instructions ----- to do one fleet enema morning of surgery Pre-Op special Istructions ----- n/a Patient verbalized understanding of instructions that were given at this phone interview. Patient denies shortness of breath, chest pain, fever, cough a this phone interview.

## 2019-05-06 ENCOUNTER — Telehealth: Payer: Self-pay | Admitting: *Deleted

## 2019-05-06 NOTE — Telephone Encounter (Signed)
CALLED PATIENT TO REMIND OF APPT. FOR 05-09-19 @ 11 AM, SPOKE WITH PATIENT AND HE IS AWARE OF THIS APPT.

## 2019-05-09 ENCOUNTER — Encounter (HOSPITAL_COMMUNITY)
Admission: RE | Admit: 2019-05-09 | Discharge: 2019-05-09 | Disposition: A | Discharge status: Home / Self Care | Payer: Medicare Other | Source: Ambulatory Visit | Attending: Urology | Admitting: Urology

## 2019-05-09 ENCOUNTER — Other Ambulatory Visit (HOSPITAL_COMMUNITY)
Admission: RE | Admit: 2019-05-09 | Discharge: 2019-05-09 | Disposition: A | Discharge status: Home / Self Care | Payer: Medicare Other | Source: Ambulatory Visit | Attending: Urology | Admitting: Urology

## 2019-05-09 ENCOUNTER — Other Ambulatory Visit: Payer: Self-pay

## 2019-05-09 DIAGNOSIS — Z01812 Encounter for preprocedural laboratory examination: Secondary | ICD-10-CM | POA: Diagnosis not present

## 2019-05-09 DIAGNOSIS — Z20822 Contact with and (suspected) exposure to covid-19: Secondary | ICD-10-CM | POA: Insufficient documentation

## 2019-05-09 LAB — PROTIME-INR
INR: 1 (ref 0.8–1.2)
Prothrombin Time: 12.9 seconds (ref 11.4–15.2)

## 2019-05-09 LAB — COMPREHENSIVE METABOLIC PANEL
ALT: 25 U/L (ref 0–44)
AST: 26 U/L (ref 15–41)
Albumin: 3.8 g/dL (ref 3.5–5.0)
Alkaline Phosphatase: 75 U/L (ref 38–126)
Anion gap: 7 (ref 5–15)
BUN: 22 mg/dL (ref 8–23)
CO2: 26 mmol/L (ref 22–32)
Calcium: 9.5 mg/dL (ref 8.9–10.3)
Chloride: 104 mmol/L (ref 98–111)
Creatinine, Ser: 1.06 mg/dL (ref 0.61–1.24)
GFR calc Af Amer: >60 mL/min (ref >60)
GFR calc non Af Amer: >60 mL/min (ref >60)
Glucose, Bld: 100 mg/dL — ABNORMAL HIGH (ref 70–99)
Potassium: 4 mmol/L (ref 3.5–5.1)
Sodium: 137 mmol/L (ref 135–145)
Total Bilirubin: 0.9 mg/dL (ref 0.3–1.2)
Total Protein: 6.8 g/dL (ref 6.5–8.1)

## 2019-05-09 LAB — CBC
HCT: 46.1 % (ref 39.0–52.0)
Hemoglobin: 15 g/dL (ref 13.0–17.0)
MCH: 30.9 pg (ref 26.0–34.0)
MCHC: 32.5 g/dL (ref 30.0–36.0)
MCV: 95.1 fL (ref 80.0–100.0)
Platelets: 207 10*3/uL (ref 150–400)
RBC: 4.85 MIL/uL (ref 4.22–5.81)
RDW: 13.1 % (ref 11.5–15.5)
WBC: 5.2 10*3/uL (ref 4.0–10.5)
nRBC: 0 % (ref 0.0–0.2)

## 2019-05-09 LAB — APTT: aPTT: 30 seconds (ref 24–36)

## 2019-05-10 LAB — SARS CORONAVIRUS 2 (TAT 6-24 HRS): SARS Coronavirus 2: NEGATIVE

## 2019-05-12 ENCOUNTER — Telehealth: Payer: Self-pay | Admitting: *Deleted

## 2019-05-12 ENCOUNTER — Telehealth: Payer: Self-pay | Admitting: Radiation Oncology

## 2019-05-12 NOTE — Telephone Encounter (Signed)
Called patient to remind of procedure for 05-13-19, spoke with patient and he is aware of this procedure

## 2019-05-12 NOTE — Progress Notes (Signed)
Spoke with patient by phone and made aware npo after midnight, arrive 1045 am 05-13-2019 [facility].

## 2019-05-12 NOTE — Telephone Encounter (Signed)
Received voicemail message from patient's daughter inquiring about restrictions following her father's implant tomorrow. Rachana explained on her voicemail that she is attempting to conceive. Phoned back. No answer. Left message detailing the following: PERSONS AGE 74-45 (if able to become pregnant)  FOR 8 WEEKS FOLLOWING IMPLANT  At a distance of 1 foot: limit time to less than 2 hours/week At a distance of 3 feet: limit time to 20 hours/week At a distance of 6 feet: no restrictions  AFTER 8 WEEKS  No restrictions   Provided my direct number for future questions. Encourage her to find someone else to directly care for her father since she is trying to conceive.

## 2019-05-13 ENCOUNTER — Ambulatory Visit (HOSPITAL_COMMUNITY): Payer: Medicare Other

## 2019-05-13 ENCOUNTER — Ambulatory Visit (HOSPITAL_BASED_OUTPATIENT_CLINIC_OR_DEPARTMENT_OTHER): Payer: Medicare Other | Admitting: Certified Registered Nurse Anesthetist

## 2019-05-13 ENCOUNTER — Ambulatory Visit (HOSPITAL_BASED_OUTPATIENT_CLINIC_OR_DEPARTMENT_OTHER)
Admission: RE | Admit: 2019-05-13 | Discharge: 2019-05-13 | Disposition: A | Discharge status: Home / Self Care | Payer: Medicare Other | Attending: Urology | Admitting: Urology

## 2019-05-13 ENCOUNTER — Encounter (HOSPITAL_BASED_OUTPATIENT_CLINIC_OR_DEPARTMENT_OTHER)
Admission: RE | Disposition: A | Discharge status: Home / Self Care | Payer: Self-pay | Source: Home / Self Care | Attending: Urology

## 2019-05-13 ENCOUNTER — Encounter (HOSPITAL_BASED_OUTPATIENT_CLINIC_OR_DEPARTMENT_OTHER): Payer: Self-pay | Admitting: Urology

## 2019-05-13 DIAGNOSIS — E785 Hyperlipidemia, unspecified: Secondary | ICD-10-CM | POA: Diagnosis not present

## 2019-05-13 DIAGNOSIS — C61 Malignant neoplasm of prostate: Secondary | ICD-10-CM | POA: Diagnosis not present

## 2019-05-13 DIAGNOSIS — Z20822 Contact with and (suspected) exposure to covid-19: Secondary | ICD-10-CM | POA: Diagnosis not present

## 2019-05-13 HISTORY — PX: SPACE OAR INSTILLATION: SHX6769

## 2019-05-13 HISTORY — DX: Calculus of kidney: N20.0

## 2019-05-13 HISTORY — DX: Presence of spectacles and contact lenses: Z97.3

## 2019-05-13 HISTORY — PX: CYSTOSCOPY: SHX5120

## 2019-05-13 HISTORY — PX: RADIOACTIVE SEED IMPLANT: SHX5150

## 2019-05-13 HISTORY — DX: Personal history of urinary calculi: Z87.442

## 2019-05-13 SURGERY — INSERTION, RADIATION SOURCE, PROSTATE
Anesthesia: General | Site: Rectum

## 2019-05-13 MED ORDER — SODIUM CHLORIDE 0.9 % IV SOLN: 2.0000 g | Freq: Once | INTRAVENOUS | Status: DC

## 2019-05-13 MED ORDER — ONDANSETRON HCL 4 MG/2ML IJ SOLN: 4.0000 mg | Freq: Once | INTRAMUSCULAR | Status: DC | PRN

## 2019-05-13 MED ORDER — FENTANYL CITRATE (PF) 100 MCG/2ML IJ SOLN
INTRAMUSCULAR | Status: DC | PRN
  Administered 2019-05-13 (×2): 25 ug via INTRAVENOUS

## 2019-05-13 MED ORDER — FENTANYL CITRATE (PF) 100 MCG/2ML IJ SOLN
INTRAMUSCULAR | Status: AC
  Filled 2019-05-13: qty 2

## 2019-05-13 MED ORDER — LIDOCAINE 2% (20 MG/ML) 5 ML SYRINGE
INTRAMUSCULAR | Status: DC | PRN
  Administered 2019-05-13: 60 mg via INTRAVENOUS

## 2019-05-13 MED ORDER — KETOROLAC TROMETHAMINE 30 MG/ML IJ SOLN
INTRAMUSCULAR | Status: AC
  Filled 2019-05-13: qty 1

## 2019-05-13 MED ORDER — SODIUM CHLORIDE 0.9 % IR SOLN
Status: DC | PRN
  Administered 2019-05-13: 1000 mL via INTRAVESICAL

## 2019-05-13 MED ORDER — SODIUM CHLORIDE FLUSH 0.9 % IV SOLN
INTRAVENOUS | Status: DC | PRN
  Administered 2019-05-13: 10 mL via INTRAVENOUS

## 2019-05-13 MED ORDER — CEFAZOLIN SODIUM-DEXTROSE 2-4 GM/100ML-% IV SOLN
2.0000 g | Freq: Once | INTRAVENOUS | Status: AC
  Administered 2019-05-13: 2 g via INTRAVENOUS

## 2019-05-13 MED ORDER — LIDOCAINE 2% (20 MG/ML) 5 ML SYRINGE
INTRAMUSCULAR | Status: AC
  Filled 2019-05-13: qty 5

## 2019-05-13 MED ORDER — OXYCODONE HCL 5 MG PO TABS: 5.0000 mg | ORAL_TABLET | Freq: Once | ORAL | Status: DC | PRN

## 2019-05-13 MED ORDER — KETOROLAC TROMETHAMINE 30 MG/ML IJ SOLN
INTRAMUSCULAR | Status: DC | PRN
Start: 2019-05-13 — End: 2019-05-13
  Administered 2019-05-13: 15 mg via INTRAVENOUS

## 2019-05-13 MED ORDER — CEFAZOLIN SODIUM-DEXTROSE 2-4 GM/100ML-% IV SOLN
INTRAVENOUS | Status: AC
  Filled 2019-05-13: qty 100

## 2019-05-13 MED ORDER — PROPOFOL 500 MG/50ML IV EMUL
INTRAVENOUS | Status: DC | PRN
  Administered 2019-05-13: 150 mg via INTRAVENOUS

## 2019-05-13 MED ORDER — EPHEDRINE 5 MG/ML INJ
INTRAVENOUS | Status: AC
  Filled 2019-05-13: qty 10

## 2019-05-13 MED ORDER — ONDANSETRON HCL 4 MG/2ML IJ SOLN
INTRAMUSCULAR | Status: DC | PRN
  Administered 2019-05-13: 4 mg via INTRAVENOUS

## 2019-05-13 MED ORDER — DEXAMETHASONE SODIUM PHOSPHATE 10 MG/ML IJ SOLN
INTRAMUSCULAR | Status: DC | PRN
  Administered 2019-05-13: 10 mg via INTRAVENOUS

## 2019-05-13 MED ORDER — EPHEDRINE SULFATE-NACL 50-0.9 MG/10ML-% IV SOSY
PREFILLED_SYRINGE | INTRAVENOUS | Status: DC | PRN
  Administered 2019-05-13 (×2): 5 mg via INTRAVENOUS

## 2019-05-13 MED ORDER — IOHEXOL 300 MG/ML  SOLN
INTRAMUSCULAR | Status: DC | PRN
  Administered 2019-05-13: 7 mL

## 2019-05-13 MED ORDER — LACTATED RINGERS IV SOLN: INTRAVENOUS | Status: DC

## 2019-05-13 MED ORDER — OXYCODONE HCL 5 MG/5ML PO SOLN: 5.0000 mg | Freq: Once | ORAL | Status: DC | PRN

## 2019-05-13 MED ORDER — FLEET ENEMA 7-19 GM/118ML RE ENEM: 1.0000 | ENEMA | Freq: Once | RECTAL | Status: DC

## 2019-05-13 MED ORDER — PROPOFOL 10 MG/ML IV BOLUS
INTRAVENOUS | Status: AC
  Filled 2019-05-13: qty 20

## 2019-05-13 MED ORDER — ONDANSETRON HCL 4 MG/2ML IJ SOLN
INTRAMUSCULAR | Status: AC
  Filled 2019-05-13: qty 2

## 2019-05-13 MED ORDER — FENTANYL CITRATE (PF) 100 MCG/2ML IJ SOLN: 25.0000 ug | INTRAMUSCULAR | Status: DC | PRN

## 2019-05-13 MED ORDER — DEXAMETHASONE SODIUM PHOSPHATE 10 MG/ML IJ SOLN
INTRAMUSCULAR | Status: AC
  Filled 2019-05-13: qty 1

## 2019-05-13 SURGICAL SUPPLY — 36 items
BAG DRN RND TRDRP ANRFLXCHMBR (UROLOGICAL SUPPLIES) ×3
BAG URINE DRAIN 2000ML AR STRL (UROLOGICAL SUPPLIES) ×4 IMPLANT
BLADE CLIPPER SENSICLIP SURGIC (BLADE) ×4 IMPLANT
CATH FOLEY 2WAY SLVR  5CC 16FR (CATHETERS) ×4
CATH FOLEY 2WAY SLVR 5CC 16FR (CATHETERS) ×3 IMPLANT
CATH ROBINSON RED A/P 16FR (CATHETERS) IMPLANT
CATH ROBINSON RED A/P 20FR (CATHETERS) ×4 IMPLANT
CLOTH BEACON ORANGE TIMEOUT ST (SAFETY) ×4 IMPLANT
CNTNR URN SCR LID CUP LEK RST (MISCELLANEOUS) ×6 IMPLANT
CONT SPEC 4OZ STRL OR WHT (MISCELLANEOUS) ×8
COVER BACK TABLE 60X90IN (DRAPES) ×4 IMPLANT
COVER MAYO STAND STRL (DRAPES) ×4 IMPLANT
DRSG TEGADERM 4X4.75 (GAUZE/BANDAGES/DRESSINGS) ×7 IMPLANT
DRSG TEGADERM 8X12 (GAUZE/BANDAGES/DRESSINGS) ×8 IMPLANT
GAUZE SPONGE 4X4 12PLY STRL (GAUZE/BANDAGES/DRESSINGS) ×1 IMPLANT
GLOVE BIO SURGEON STRL SZ7.5 (GLOVE) ×4 IMPLANT
GLOVE BIO SURGEON STRL SZ8 (GLOVE) IMPLANT
GLOVE SURG ORTHO 8.5 STRL (GLOVE) ×4 IMPLANT
GLOVE SURG SS PI 6.5 STRL IVOR (GLOVE) ×1 IMPLANT
GOWN STRL REUS W/TWL LRG LVL3 (GOWN DISPOSABLE) ×4 IMPLANT
HOLDER FOLEY CATH W/STRAP (MISCELLANEOUS) ×4 IMPLANT
I-SEED AGX100 ×57 IMPLANT
IMPL SPACEOAR SYSTEM 10ML (Spacer) ×3 IMPLANT
IMPLANT SPACEOAR SYSTEM 10ML (Spacer) ×4 IMPLANT
IV NS 1000ML (IV SOLUTION) ×4
IV NS 1000ML BAXH (IV SOLUTION) ×3 IMPLANT
KIT TURNOVER CYSTO (KITS) ×4 IMPLANT
MARKER SKIN DUAL TIP RULER LAB (MISCELLANEOUS) ×4 IMPLANT
SURGILUBE 2OZ TUBE FLIPTOP (MISCELLANEOUS) ×4 IMPLANT
SUT BONE WAX W31G (SUTURE) IMPLANT
SYR 10ML LL (SYRINGE) ×4 IMPLANT
TOWEL OR 17X26 10 PK STRL BLUE (TOWEL DISPOSABLE) ×4 IMPLANT
TRAY CYSTO PACK (CUSTOM PROCEDURE TRAY) ×4 IMPLANT
TUBE CONNECTING 12X1/4 (SUCTIONS) IMPLANT
UNDERPAD 30X30 (UNDERPADS AND DIAPERS) ×8 IMPLANT
WATER STERILE IRR 500ML POUR (IV SOLUTION) ×4 IMPLANT

## 2019-05-13 NOTE — H&P (Signed)
H&P  Chief Complaint: I have prostate cancer (treatment).  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   No h/o BPH. AUASS = 2. No ED.   Today, he is well.  No fever.  No dysuria or gross hematuria.  He has had some fatigue and overall slight muscle aches after the [place].  Also some mild hot flashes.    Past Medical History:  Diagnosis Date  . History of kidney stones   . HLD (hyperlipidemia)   . Nephrolithiasis    per renal ultrasound 01-02-2019 in epic, left 69mm  . Prostate cancer [facility]) urologist-- dr Felix Pratt/  dr Tammi Klippel   dx 01-14-2019, Stage T1, Gleason 4+5  . Wears glasses    Past Surgical History:  Procedure Laterality Date  . ABDOMINAL ADHESION SURGERY  1976   [place]  . APPENDECTOMY  1972  . COLONOSCOPY  01/06/2002   normal    Home Medications:  No medications prior to admission.   Allergies:  Allergies  Allergen Reactions  . Amoxicillin-Pot Clavulanate Nausea And Vomiting  . Avelox [Moxifloxacin Hcl In Nacl] Nausea And Vomiting    Family History  Problem Relation Age of Onset  . Hyperlipidemia Brother   . Colon cancer Neg Hx   . Prostate cancer Neg Hx   . Pancreatic cancer Neg Hx   . Breast cancer Neg Hx    Social History:  reports that he has never smoked. He has never used smokeless tobacco. He reports that he does not drink alcohol or use drugs.  ROS: A complete review of systems was performed.  All systems are negative except for pertinent findings as noted. Review of Systems  All other systems reviewed and are negative.    Physical Exam:  Vital signs in last 24 hours:   General:  Alert and oriented, No acute distress HEENT: Normocephalic, atraumatic Cardiovascular: Regular rate  and rhythm Lungs: Regular rate and effort Abdomen: Soft, nontender, nondistended, no abdominal masses Back: No CVA tenderness Extremities: No edema Neurologic: Grossly intact  Laboratory Data:  No results found for this or any previous visit (from the past 24 hour(s)). Recent Results (from the past 240 hour(s))  SARS CORONAVIRUS 2 (TAT 6-24 HRS) Nasopharyngeal Nasopharyngeal Swab     Status: None   Collection Time: 05/09/19 11:42 AM   Specimen: Nasopharyngeal Swab  Result Value Ref Range Status   SARS Coronavirus 2 NEGATIVE NEGATIVE Final    Comment: (NOTE) SARS-CoV-2 target nucleic acids are NOT DETECTED. The SARS-CoV-2 RNA is generally detectable in upper and lower respiratory specimens during the acute phase of infection. Negative results do not preclude SARS-CoV-2 infection, do not rule out co-infections with other pathogens, and should not be used as the sole basis for treatment or other patient management decisions. Negative results must be combined with clinical observations, patient history, and epidemiological information. The expected result is Negative. Fact Sheet for Patients: SugarRoll.be Fact Sheet for Healthcare Providers: https://www.woods-mathews.com/ This test is not yet approved or cleared by the [place] FDA and  has been authorized for detection and/or diagnosis of SARS-CoV-2 by FDA under an Emergency Use Authorization (EUA). This EUA will remain  in effect (meaning this test can  be used) for the duration of the COVID-19 declaration under Section 56 4(b)(1) of the Act, 21 U.S.C. section 360bbb-3(b)(1), unless the authorization is terminated or revoked sooner. Performed at [facility], [place] [address], [place], [place] [address]    Creatinine: Recent Labs    05/09/19 1110  CREATININE 1.06    Impression/Assessment/plan:  I discussed with the patient the nature, potential benefits, risks and  alternatives to prostate brachytherapy seed implant, SpaceOAR biodegradable gel insertion, cystoscopy, including side effects of the proposed treatment, the likelihood of the patient achieving the goals of the procedure, and any potential problems that might occur during the procedure or recuperation. All questions answered. Patient elects to proceed.    Festus Aloe 05/13/2019, 11:00 AM

## 2019-05-13 NOTE — Transfer of Care (Signed)
Immediate Anesthesia Transfer of Care Note  Patient: Shawn Walsh  Procedure(s) Performed: Procedure(s) with comments: RADIOACTIVE SEED IMPLANT/BRACHYTHERAPY IMPLANT (N/A) -  70  SEEDS IMPLANTED SPACE OAR INSTILLATION (N/A) CYSTOSCOPY FLEXIBLE (N/A) - NO SEEDS FOUND IN BLADDER  Patient Location: PACU  Anesthesia Type:General  Level of Consciousness: Alert, Awake, Oriented  Airway & Oxygen Therapy: Patient Spontanous Breathing  Post-op Assessment: Report given to RN  Post vital signs: Reviewed and stable  Last Vitals:  Vitals:   05/13/19 1122  BP: 121/77  Pulse: 63  Resp: 15  Temp: (!) 36.3 C  SpO2: [id number]    Complications: No apparent anesthesia complications

## 2019-05-13 NOTE — Op Note (Signed)
Preoperative diagnosis: Stage IIIB (T1cNxMx. GG5) Prostate cancer Postoperative diagnosis: Same  Procedure: Prostate brachytherapy seed implant, Cystoscopy, SpaceOar biogel   Surgeon: Junious Silk  Radiation oncologist: Tammi Klippel  Anesthesia: Gen.  Indication for procedure: 74 yo with stage IIIB prostate cancer who elected to proceed with prostate brachytherapy boost prior to EBXRT.   Findings: On fluoroscopic imaging there was adequate coverage of the prostate. On cystoscopy the urethra appeared normal, the prostatic urethra appeared normal, the trigone and ureteral orifices appeared normal with clear efflux. The bladder mucosa appeared normal. There were no stones, foreign bodies or seeds visualized in the bladder.  Dose:110 Gy  Description of procedure: After consent was obtained patient brought to the operating room. After adequate anesthesia he is placed in lithotomy position and the transrectal ultrasound probe and perineal template positioned. Catheters and brachytherapy seeds were placed per Dr. Johny Shears plan. A total of 21catheters and 57 active sources (I-125) were placed. The anchoring needles, template and ultrasound were removed. Scout flouro imaging was obtained of the implant. The Foley was removed. Another image obtained.   The 18-gauge needle was then inserted approximately 1 to 2 cm anterior to the anal opening and directed under fluoroscopic guidance into the perirectal fat between the anterior rectal wall and the prostate capsule down to the mid-gland. Midline needle position was confirmed in the sagittal and axial views to verify the tip was in the perirectal fat.  Small amounts of saline were injected to hydrodissect the space between the prostate and the anterior rectal wall.  Axial imaging was viewed to confirm the needle was in the correct location in the mid gland and centered.  Aspiration confirmed no intravascular access.  The saline syringe was carefully  disconnected maintaining the desired needle position and the hydrogel was attached to the needle.  Under ultrasound guidance in the sagittal view a smooth continuous injection was done over about 12 seconds delivering the hydrogel into the space between the prostate and rectal wall.  The needle was withdrawn.   The patient was prepped again and cystoscopy was performed which was noted to be normal. He was awakened taken to the recovery room in stable condition.  Complications: None  Blood loss: Minimal  Specimens: None  Drains: None   Disposition: Patient stable to PACU.

## 2019-05-13 NOTE — Discharge Instructions (Signed)
Brachytherapy for Prostate Cancer, Care After ° °This sheet gives you information about how to care for yourself after your procedure. Your health care provider may also give you more specific instructions. If you have problems or questions, contact your health care provider. °What can I expect after the procedure? °After the procedure, it is common to have: °· Trouble passing urine. °· Blood in the urine or semen. °· Constipation. °· Frequent feeling of an urgent need to urinate. °· Bruising, swelling, and tenderness of the area behind the scrotum (perineum). °· Bloating and gas. °· Fatigue. °· Burning or pain in the rectum. °· Problems getting or keeping an erection (erectile dysfunction). °· Nausea. °Follow these instructions at home: °Managing pain, stiffness, and swelling °· If directed, apply ice to the affected area: °? Put ice in a plastic bag. °? Place a towel between your skin and the bag. °? Leave the ice on for 20 minutes, 2-3 times a day. °· Try not to sit directly on the area behind the scrotum. A soft cushion can help with discomfort. °Activity °· Do not drive for 24 hours if you were given a medicine to help you relax (sedative). °· Do not drive or use heavy machinery while taking prescription pain medicine. °· Rest as told by your health care provider. °· Most people can return to normal activities a few days or weeks after the procedure. Ask your health care provider what activities are safe for you. °Eating and drinking °· Drink enough fluid to keep your urine clear or pale yellow. °· Eat a healthy, balanced diet. This includes lean proteins, whole grains, and plenty of fruits and vegetables. °General instructions °· Take over-the-counter and prescription medicines only as told by your health care provider. °· Keep all follow-up visits as told by your health care provider. This is important. You may still need additional treatment. °· Do not take baths, swim, or use a hot tub until your health  care provider approves. Shower and wash the area behind the scrotum gently. °· Do not have sex for one week after the treatment, or until your health care provider approves. °· If you have permanent, low-dose brachytherapy implants: °? Limit close contact with children and pregnant women for 2 months or as told by your health care provider. This is important because of the radiation that is still active in the prostate. °? You may set off radioactive sensors, such as airport screenings. Ask your health care provider for a document that explains your treatment. °? You may be instructed to use a condom during sex for the first 2 months after low-dose brachytherapy. °Contact a health care provider if: °· You have a fever or chills. °· You do not have a bowel movement for 3-4 days after the procedure. °· You have diarrhea for 3-4 days after the procedure. °· You develop any new symptoms, such as problems with urinating or erectile dysfunction. °· You have abdomen (abdominal) pain. °· You have more blood in your urine. °Get help right away if: °· You cannot urinate. °· There is excessive bleeding from your rectum. °· You have unusual drainage coming from your rectum. °· You have severe pain in the treated area that does not go away with pain medicine. °· You have severe nausea or vomiting. °Summary °· If you have permanent, low-dose brachytherapy implants, limit close contact with children and pregnant women for 2 months or as told by your health care provider. This is important because of the radiation   that is still active in the prostate. °· Talk with your health care provider about your risk of brachytherapy side effects, such as erectile dysfunction or urinary problems. Your health care provider will be able to recommend possible treatment options. °· Keep all follow-up visits as told by your health care provider. This is important. You may need additional treatment. °This information is not intended to replace  advice given to you by your health care provider. Make sure you discuss any questions you have with your health care provider. °Document Revised: 12/01/2016 Document Reviewed: 01/21/2016 °Elsevier Patient Education © 2020 [employer] ° ° °Post Anesthesia Home Care Instructions ° °Activity: °Get plenty of rest for the remainder of the day. A responsible individual must stay with you for 24 hours following the procedure.  °For the next 24 hours, DO NOT: °-Drive a car °-Operate machinery °-Drink alcoholic beverages °-Take any medication unless instructed by your physician °-Make any legal decisions or sign important papers. ° °Meals: °Start with liquid foods such as gelatin or soup. Progress to regular foods as tolerated. Avoid greasy, spicy, heavy foods. If nausea and/or vomiting occur, drink only clear liquids until the nausea and/or vomiting subsides. Call your physician if vomiting continues. ° °Special Instructions/Symptoms: °Your throat may feel dry or sore from the anesthesia or the breathing tube placed in your throat during surgery. If this causes discomfort, gargle with warm salt water. The discomfort should disappear within 24 hours. °   ° ° ° °

## 2019-05-13 NOTE — Anesthesia Preprocedure Evaluation (Addendum)
Anesthesia Evaluation  Patient identified by MRN, date of birth, ID band Patient awake    Reviewed: Allergy & Precautions, NPO status , Patient's Chart, lab work & pertinent test results  History of Anesthesia Complications Negative for: history of anesthetic complications  Airway Mallampati: II  TM Distance: >3 FB Neck ROM: Full    Dental  (+) Dental Advisory Given, Teeth Intact   Pulmonary neg pulmonary ROS,    Pulmonary exam normal        Cardiovascular negative cardio ROS Normal cardiovascular exam     Neuro/Psych negative neurological ROS  negative psych ROS   GI/Hepatic negative GI ROS, Neg liver ROS,   Endo/Other  negative endocrine ROS  Renal/GU negative Renal ROS    Prostate cancer     Musculoskeletal negative musculoskeletal ROS (+)   Abdominal   Peds  Hematology negative hematology ROS (+)   Anesthesia Other Findings Covid neg 5/7   Reproductive/Obstetrics                            Anesthesia Physical Anesthesia Plan  ASA: II  Anesthesia Plan: General   Post-op Pain Management:    Induction: Intravenous  PONV Risk Score and Plan: 2 and Treatment may vary due to age or medical condition and Ondansetron  Airway Management Planned: LMA  Additional Equipment: None  Intra-op Plan:   Post-operative Plan: Extubation in OR  Informed Consent: I have reviewed the patients History and Physical, chart, labs and discussed the procedure including the risks, benefits and alternatives for the proposed anesthesia with the patient or authorized representative who has indicated his/her understanding and acceptance.     Dental advisory given  Plan Discussed with: CRNA and Anesthesiologist  Anesthesia Plan Comments:        Anesthesia Quick Evaluation

## 2019-05-13 NOTE — Anesthesia Procedure Notes (Signed)
Procedure Name: LMA Insertion Date/Time: 05/13/2019 1:21 PM Performed by: Gerald Leitz, CRNA Pre-anesthesia Checklist: Patient identified, Patient being monitored, Timeout performed, Emergency Drugs available and Suction available Patient Re-evaluated:Patient Re-evaluated prior to induction Oxygen Delivery Method: Circle system utilized Preoxygenation: Pre-oxygenation with 100% oxygen Induction Type: IV induction Ventilation: Mask ventilation without difficulty LMA: LMA inserted LMA Size: 4.0 Tube type: Oral Number of attempts: 1 Placement Confirmation: positive ETCO2 and breath sounds checked- equal and bilateral Tube secured with: Tape Dental Injury: Teeth and Oropharynx as per pre-operative assessment

## 2019-05-14 NOTE — Anesthesia Postprocedure Evaluation (Signed)
Anesthesia Post Note  Patient: Shawn Walsh  Procedure(s) Performed: RADIOACTIVE SEED IMPLANT/BRACHYTHERAPY IMPLANT (N/A Prostate) SPACE OAR INSTILLATION (N/A Rectum) CYSTOSCOPY FLEXIBLE (N/A Bladder)     Patient location during evaluation: PACU Anesthesia Type: General Level of consciousness: awake and alert Pain management: pain level controlled Vital Signs Assessment: post-procedure vital signs reviewed and stable Respiratory status: spontaneous breathing, nonlabored ventilation and respiratory function stable Cardiovascular status: blood pressure returned to baseline and stable Postop Assessment: no apparent nausea or vomiting Anesthetic complications: no    Last Vitals:  Vitals:   05/13/19 1500 05/13/19 1605  BP: 117/70 125/65  Pulse: 81 77  Resp: 15 16  Temp:  (!) 36.2 C  SpO2: 96% 98%    Last Pain:  Vitals:   05/13/19 1600  TempSrc:   PainSc: 0-No pain                 Audry Pili

## 2019-05-18 NOTE — Progress Notes (Signed)
  Radiation Oncology         [phone] ________________________________  Name: Menzo Braam MRN: [id number]  Date: 05/18/2019  DOB: [birth year]       Prostate Seed Implant  [id number], Lennette Bihari, MD  No ref. provider found  DIAGNOSIS:  Oncology History  Malignant neoplasm of prostate ([place])  01/14/2019 Cancer Staging   Staging form: Prostate, AJCC 8th Edition - Clinical stage from 01/14/2019: Stage IIIC (cT1c, cN0, cM0, PSA: 11.9, Grade Group: 5) - Signed by Freeman Caldron, PA-C on 02/18/2019   02/18/2019 Initial Diagnosis   Malignant neoplasm of prostate ([place])       ICD-10-CM   1. Malignant neoplasm of prostate [facility])  C61 Discharge patient    PROCEDURE: Insertion of radioactive I-125 seeds into the prostate gland.  RADIATION DOSE: 110 Gy, boost therapy.  TECHNIQUE: Frederik Burghardt was brought to the operating room with the urologist. He was placed in the dorsolithotomy position. He was catheterized and a rectal tube was inserted. The perineum was shaved, prepped and draped. The ultrasound probe was then introduced into the rectum to see the prostate gland.  TREATMENT DEVICE: A needle grid was attached to the ultrasound probe stand and anchor needles were placed.  3D PLANNING: The prostate was imaged in 3D using a sagittal sweep of the prostate probe. These images were transferred to the planning computer. There, the prostate, urethra and rectum were defined on each axial reconstructed image. Then, the software created an optimized 3D plan and a few seed positions were adjusted. The quality of the plan was reviewed using [facility] information for the target and the following two organs at risk:  Urethra and Rectum.  Then the accepted plan was printed and handed off to the radiation therapist.  Under my supervision, the custom loading of the seeds and spacers was carried out and loaded into sealed vicryl sleeves.  These pre-loaded needles were then placed into the needle holder.Marland Kitchen  PROSTATE  VOLUME STUDY:  Using transrectal ultrasound the volume of the prostate was verified to be 24.9 cc.  SPECIAL TREATMENT PROCEDURE/SUPERVISION AND HANDLING: The pre-loaded needles were then delivered under sagittal guidance. A total of 21 needles were used to deposit 57 seeds in the prostate gland. The individual seed activity was 0.307 mCi.  SpaceOAR:  Yes  COMPLEX SIMULATION: At the end of the procedure, an anterior radiograph of the pelvis was obtained to document seed positioning and count. Cystoscopy was performed to check the urethra and bladder.  MICRODOSIMETRY: At the end of the procedure, the patient was emitting 0.21 mR/hr at 1 meter. Accordingly, he was considered safe for hospital discharge.  PLAN: The patient will return to the radiation oncology clinic for post implant CT dosimetry in three weeks.   ________________________________  Sheral Apley Tammi Klippel, M.D.

## 2019-05-28 ENCOUNTER — Telehealth: Payer: Self-pay | Admitting: *Deleted

## 2019-05-28 ENCOUNTER — Other Ambulatory Visit: Payer: Self-pay | Admitting: Urology

## 2019-05-28 DIAGNOSIS — R252 Cramp and spasm: Secondary | ICD-10-CM | POA: Diagnosis not present

## 2019-05-28 DIAGNOSIS — S70362A Insect bite (nonvenomous), left thigh, initial encounter: Secondary | ICD-10-CM | POA: Diagnosis not present

## 2019-05-28 MED ORDER — LORAZEPAM 1 MG PO TABS
1.0000 mg | ORAL_TABLET | ORAL | 0 refills | Status: DC | PRN
Start: 2019-05-28 — End: 2020-04-05

## 2019-05-28 NOTE — Telephone Encounter (Signed)
Called patient to remind of sim and MRI appt. for 05/29/19, no answer, unable to leave message, will call later

## 2019-05-29 ENCOUNTER — Ambulatory Visit (HOSPITAL_COMMUNITY)
Admission: RE | Admit: 2019-05-29 | Discharge: 2019-05-29 | Disposition: A | Discharge status: Home / Self Care | Payer: Medicare Other | Source: Ambulatory Visit | Attending: Urology | Admitting: Urology

## 2019-05-29 ENCOUNTER — Other Ambulatory Visit: Payer: Self-pay

## 2019-05-29 ENCOUNTER — Encounter: Payer: Self-pay | Admitting: Medical Oncology

## 2019-05-29 ENCOUNTER — Telehealth: Payer: Self-pay | Admitting: *Deleted

## 2019-05-29 ENCOUNTER — Ambulatory Visit
Admission: RE | Admit: 2019-05-29 | Discharge: 2019-05-29 | Disposition: A | Discharge status: Home / Self Care | Payer: Medicare Other | Source: Ambulatory Visit | Attending: Radiation Oncology | Admitting: Radiation Oncology

## 2019-05-29 DIAGNOSIS — C61 Malignant neoplasm of prostate: Secondary | ICD-10-CM | POA: Diagnosis not present

## 2019-05-29 NOTE — Telephone Encounter (Signed)
Called patient to inform that he needs to pick-up script prior to MRI today, spoke with patient and he understands to pick-up script prior to MRI today

## 2019-05-31 ENCOUNTER — Other Ambulatory Visit (HOSPITAL_COMMUNITY): Payer: Medicare Other

## 2019-06-02 NOTE — Progress Notes (Signed)
  Radiation Oncology         [phone] ________________________________  Name: Shawn Walsh MRN: [id number]  Date: 05/29/2019  DOB: Mar 14, 1945  COMPLEX SIMULATION NOTE  NARRATIVE:  The patient was brought to the [facility] today following prostate seed implantation approximately one month ago.  Identity was confirmed.  All relevant records and images related to the planned course of therapy were reviewed.  Then, the patient was set-up supine.  CT images were obtained.  The CT images were loaded into the planning software.  Then the prostate and rectum were contoured.  Treatment planning then occurred.  The implanted iodine 125 seeds were identified by the physics staff for projection of radiation distribution  I have requested : 3D Simulation  I have requested a DVH of the following structures: Prostate and rectum.    ________________________________  Sheral Apley Tammi Klippel, M.D.

## 2019-06-02 NOTE — Progress Notes (Signed)
  Radiation Oncology         [phone] ________________________________  Name: Shawn Walsh MRN: [id number]  Date: 05/29/2019  DOB: 08-19-1945  SIMULATION AND TREATMENT PLANNING NOTE    ICD-10-CM   1. Malignant neoplasm of prostate ([place])  C61     DIAGNOSIS:  74 y.o. gentleman with Stage T1c adenocarcinoma of the prostate with Gleason score of 4+5, and PSA of 11.9  NARRATIVE:  The patient was brought to the [facility].  Identity was confirmed.  All relevant records and images related to the planned course of therapy were reviewed.  The patient freely provided informed written consent to proceed with treatment after reviewing the details related to the planned course of therapy. The consent form was witnessed and verified by the simulation staff.  Then, the patient was set-up in a stable reproducible supine position for radiation therapy.  A vacuum lock pillow device was custom fabricated to position his legs in a reproducible immobilized position.  Then, I performed a urethrogram under sterile conditions to identify the prostatic apex.  CT images were obtained.  Surface markings were placed.  The CT images were loaded into the planning software.  Then the prostate target and avoidance structures including the rectum, bladder, bowel and hips were contoured.  Treatment planning then occurred.  The radiation prescription was entered and confirmed.  A total of one complex treatment devices were fabricated. I have requested : Intensity Modulated Radiotherapy (IMRT) is medically necessary for this case for the following reason:  Rectal sparing.Marland Kitchen  PLAN:  The patient will receive 45 Gy in 25 fractions of 1.8 Gy, to supplement an up-front prostate seed implant boost of 110 Gy to achieve a total nominal dose of 165 Gy.  ________________________________  Sheral Apley Tammi Klippel, M.D.

## 2019-06-05 DIAGNOSIS — C61 Malignant neoplasm of prostate: Secondary | ICD-10-CM | POA: Insufficient documentation

## 2019-06-05 DIAGNOSIS — R3912 Poor urinary stream: Secondary | ICD-10-CM | POA: Diagnosis not present

## 2019-06-10 ENCOUNTER — Encounter: Payer: Self-pay | Admitting: Medical Oncology

## 2019-06-10 ENCOUNTER — Ambulatory Visit
Admission: RE | Admit: 2019-06-10 | Discharge: 2019-06-10 | Disposition: A | Discharge status: Home / Self Care | Payer: Medicare Other | Source: Ambulatory Visit | Attending: Radiation Oncology | Admitting: Radiation Oncology

## 2019-06-10 ENCOUNTER — Ambulatory Visit: Payer: Medicare Other

## 2019-06-10 ENCOUNTER — Other Ambulatory Visit: Payer: Self-pay

## 2019-06-10 DIAGNOSIS — C61 Malignant neoplasm of prostate: Secondary | ICD-10-CM | POA: Diagnosis not present

## 2019-06-10 DIAGNOSIS — D2372 Other benign neoplasm of skin of left lower limb, including hip: Secondary | ICD-10-CM | POA: Diagnosis not present

## 2019-06-10 DIAGNOSIS — R202 Paresthesia of skin: Secondary | ICD-10-CM | POA: Diagnosis not present

## 2019-06-11 ENCOUNTER — Encounter: Payer: Self-pay | Admitting: Radiation Oncology

## 2019-06-11 ENCOUNTER — Other Ambulatory Visit: Payer: Self-pay

## 2019-06-11 ENCOUNTER — Ambulatory Visit: Payer: Medicare Other

## 2019-06-11 ENCOUNTER — Ambulatory Visit
Admission: RE | Admit: 2019-06-11 | Discharge: 2019-06-11 | Disposition: A | Discharge status: Home / Self Care | Payer: Medicare Other | Source: Ambulatory Visit | Attending: Radiation Oncology | Admitting: Radiation Oncology

## 2019-06-11 DIAGNOSIS — C61 Malignant neoplasm of prostate: Secondary | ICD-10-CM | POA: Diagnosis not present

## 2019-06-12 ENCOUNTER — Ambulatory Visit: Payer: Medicare Other

## 2019-06-12 ENCOUNTER — Ambulatory Visit
Admission: RE | Admit: 2019-06-12 | Discharge: 2019-06-12 | Disposition: A | Discharge status: Home / Self Care | Payer: Medicare Other | Source: Ambulatory Visit | Attending: Radiation Oncology | Admitting: Radiation Oncology

## 2019-06-12 ENCOUNTER — Other Ambulatory Visit: Payer: Self-pay

## 2019-06-12 DIAGNOSIS — C61 Malignant neoplasm of prostate: Secondary | ICD-10-CM | POA: Diagnosis not present

## 2019-06-13 ENCOUNTER — Ambulatory Visit
Admission: RE | Admit: 2019-06-13 | Discharge: 2019-06-13 | Disposition: A | Discharge status: Home / Self Care | Payer: Medicare Other | Source: Ambulatory Visit | Attending: Radiation Oncology | Admitting: Radiation Oncology

## 2019-06-13 ENCOUNTER — Other Ambulatory Visit: Payer: Self-pay

## 2019-06-13 ENCOUNTER — Ambulatory Visit: Payer: Medicare Other

## 2019-06-13 DIAGNOSIS — C61 Malignant neoplasm of prostate: Secondary | ICD-10-CM | POA: Diagnosis not present

## 2019-06-16 ENCOUNTER — Ambulatory Visit
Admission: RE | Admit: 2019-06-16 | Discharge: 2019-06-16 | Disposition: A | Discharge status: Home / Self Care | Payer: Medicare Other | Source: Ambulatory Visit | Attending: Radiation Oncology | Admitting: Radiation Oncology

## 2019-06-16 ENCOUNTER — Ambulatory Visit: Payer: Medicare Other

## 2019-06-16 ENCOUNTER — Other Ambulatory Visit: Payer: Self-pay

## 2019-06-16 DIAGNOSIS — C61 Malignant neoplasm of prostate: Secondary | ICD-10-CM | POA: Diagnosis not present

## 2019-06-17 ENCOUNTER — Ambulatory Visit: Payer: Medicare Other

## 2019-06-17 ENCOUNTER — Other Ambulatory Visit: Payer: Self-pay

## 2019-06-17 ENCOUNTER — Ambulatory Visit
Admission: RE | Admit: 2019-06-17 | Discharge: 2019-06-17 | Disposition: A | Discharge status: Home / Self Care | Payer: Medicare Other | Source: Ambulatory Visit | Attending: Radiation Oncology | Admitting: Radiation Oncology

## 2019-06-17 DIAGNOSIS — C61 Malignant neoplasm of prostate: Secondary | ICD-10-CM | POA: Diagnosis not present

## 2019-06-18 ENCOUNTER — Ambulatory Visit
Admission: RE | Admit: 2019-06-18 | Discharge: 2019-06-18 | Disposition: A | Discharge status: Home / Self Care | Payer: Medicare Other | Source: Ambulatory Visit | Attending: Radiation Oncology | Admitting: Radiation Oncology

## 2019-06-18 ENCOUNTER — Other Ambulatory Visit: Payer: Self-pay

## 2019-06-18 ENCOUNTER — Ambulatory Visit: Payer: Medicare Other

## 2019-06-18 DIAGNOSIS — C61 Malignant neoplasm of prostate: Secondary | ICD-10-CM | POA: Diagnosis not present

## 2019-06-19 ENCOUNTER — Ambulatory Visit: Payer: Medicare Other

## 2019-06-19 ENCOUNTER — Ambulatory Visit
Admission: RE | Admit: 2019-06-19 | Discharge: 2019-06-19 | Disposition: A | Discharge status: Home / Self Care | Payer: Medicare Other | Source: Ambulatory Visit | Attending: Radiation Oncology | Admitting: Radiation Oncology

## 2019-06-19 ENCOUNTER — Other Ambulatory Visit: Payer: Self-pay

## 2019-06-19 DIAGNOSIS — C61 Malignant neoplasm of prostate: Secondary | ICD-10-CM | POA: Diagnosis not present

## 2019-06-20 ENCOUNTER — Ambulatory Visit
Admission: RE | Admit: 2019-06-20 | Discharge: 2019-06-20 | Disposition: A | Discharge status: Home / Self Care | Payer: Medicare Other | Source: Ambulatory Visit | Attending: Radiation Oncology | Admitting: Radiation Oncology

## 2019-06-20 ENCOUNTER — Ambulatory Visit: Payer: Medicare Other

## 2019-06-20 ENCOUNTER — Other Ambulatory Visit: Payer: Self-pay

## 2019-06-20 DIAGNOSIS — C61 Malignant neoplasm of prostate: Secondary | ICD-10-CM | POA: Diagnosis not present

## 2019-06-23 ENCOUNTER — Other Ambulatory Visit: Payer: Self-pay

## 2019-06-23 ENCOUNTER — Ambulatory Visit
Admission: RE | Admit: 2019-06-23 | Discharge: 2019-06-23 | Disposition: A | Discharge status: Home / Self Care | Payer: Medicare Other | Source: Ambulatory Visit | Attending: Radiation Oncology | Admitting: Radiation Oncology

## 2019-06-23 ENCOUNTER — Ambulatory Visit: Payer: Medicare Other

## 2019-06-23 DIAGNOSIS — C61 Malignant neoplasm of prostate: Secondary | ICD-10-CM | POA: Diagnosis not present

## 2019-06-24 ENCOUNTER — Ambulatory Visit
Admission: RE | Admit: 2019-06-24 | Discharge: 2019-06-24 | Disposition: A | Discharge status: Home / Self Care | Payer: Medicare Other | Source: Ambulatory Visit | Attending: Radiation Oncology | Admitting: Radiation Oncology

## 2019-06-24 ENCOUNTER — Ambulatory Visit: Payer: Medicare Other

## 2019-06-24 ENCOUNTER — Other Ambulatory Visit: Payer: Self-pay

## 2019-06-24 DIAGNOSIS — C61 Malignant neoplasm of prostate: Secondary | ICD-10-CM | POA: Diagnosis not present

## 2019-06-25 ENCOUNTER — Ambulatory Visit: Payer: Medicare Other

## 2019-06-25 ENCOUNTER — Other Ambulatory Visit: Payer: Self-pay

## 2019-06-25 ENCOUNTER — Ambulatory Visit
Admission: RE | Admit: 2019-06-25 | Discharge: 2019-06-25 | Disposition: A | Discharge status: Home / Self Care | Payer: Medicare Other | Source: Ambulatory Visit | Attending: Radiation Oncology | Admitting: Radiation Oncology

## 2019-06-25 DIAGNOSIS — C61 Malignant neoplasm of prostate: Secondary | ICD-10-CM | POA: Diagnosis not present

## 2019-06-26 ENCOUNTER — Other Ambulatory Visit: Payer: Self-pay

## 2019-06-26 ENCOUNTER — Ambulatory Visit: Payer: Medicare Other

## 2019-06-26 ENCOUNTER — Ambulatory Visit
Admission: RE | Admit: 2019-06-26 | Discharge: 2019-06-26 | Disposition: A | Discharge status: Home / Self Care | Payer: Medicare Other | Source: Ambulatory Visit | Attending: Radiation Oncology | Admitting: Radiation Oncology

## 2019-06-26 DIAGNOSIS — C61 Malignant neoplasm of prostate: Secondary | ICD-10-CM | POA: Diagnosis not present

## 2019-06-27 ENCOUNTER — Ambulatory Visit
Admission: RE | Admit: 2019-06-27 | Discharge: 2019-06-27 | Disposition: A | Discharge status: Home / Self Care | Payer: Medicare Other | Source: Ambulatory Visit | Attending: Radiation Oncology | Admitting: Radiation Oncology

## 2019-06-27 ENCOUNTER — Ambulatory Visit: Payer: Medicare Other

## 2019-06-27 DIAGNOSIS — C61 Malignant neoplasm of prostate: Secondary | ICD-10-CM | POA: Diagnosis not present

## 2019-06-30 ENCOUNTER — Ambulatory Visit
Admission: RE | Admit: 2019-06-30 | Discharge: 2019-06-30 | Disposition: A | Discharge status: Home / Self Care | Payer: Medicare Other | Source: Ambulatory Visit | Attending: Radiation Oncology | Admitting: Radiation Oncology

## 2019-06-30 ENCOUNTER — Ambulatory Visit: Payer: Medicare Other

## 2019-06-30 ENCOUNTER — Other Ambulatory Visit: Payer: Self-pay

## 2019-06-30 DIAGNOSIS — C61 Malignant neoplasm of prostate: Secondary | ICD-10-CM | POA: Diagnosis not present

## 2019-07-01 ENCOUNTER — Other Ambulatory Visit: Payer: Self-pay

## 2019-07-01 ENCOUNTER — Ambulatory Visit: Payer: Medicare Other

## 2019-07-01 ENCOUNTER — Ambulatory Visit
Admission: RE | Admit: 2019-07-01 | Discharge: 2019-07-01 | Disposition: A | Discharge status: Home / Self Care | Payer: Medicare Other | Source: Ambulatory Visit | Attending: Radiation Oncology | Admitting: Radiation Oncology

## 2019-07-01 DIAGNOSIS — C61 Malignant neoplasm of prostate: Secondary | ICD-10-CM | POA: Diagnosis not present

## 2019-07-02 ENCOUNTER — Ambulatory Visit: Payer: Medicare Other

## 2019-07-02 ENCOUNTER — Other Ambulatory Visit: Payer: Self-pay

## 2019-07-02 ENCOUNTER — Ambulatory Visit
Admission: RE | Admit: 2019-07-02 | Discharge: 2019-07-02 | Disposition: A | Discharge status: Home / Self Care | Payer: Medicare Other | Source: Ambulatory Visit | Attending: Radiation Oncology | Admitting: Radiation Oncology

## 2019-07-02 DIAGNOSIS — C61 Malignant neoplasm of prostate: Secondary | ICD-10-CM | POA: Diagnosis not present

## 2019-07-03 ENCOUNTER — Ambulatory Visit
Admission: RE | Admit: 2019-07-03 | Discharge: 2019-07-03 | Disposition: A | Discharge status: Home / Self Care | Payer: Medicare Other | Source: Ambulatory Visit | Attending: Radiation Oncology | Admitting: Radiation Oncology

## 2019-07-03 ENCOUNTER — Ambulatory Visit: Payer: Medicare Other

## 2019-07-03 ENCOUNTER — Other Ambulatory Visit: Payer: Self-pay

## 2019-07-03 DIAGNOSIS — C61 Malignant neoplasm of prostate: Secondary | ICD-10-CM | POA: Diagnosis not present

## 2019-07-04 ENCOUNTER — Ambulatory Visit
Admission: RE | Admit: 2019-07-04 | Discharge: 2019-07-04 | Disposition: A | Discharge status: Home / Self Care | Payer: Medicare Other | Source: Ambulatory Visit | Attending: Radiation Oncology | Admitting: Radiation Oncology

## 2019-07-04 ENCOUNTER — Other Ambulatory Visit: Payer: Self-pay

## 2019-07-04 ENCOUNTER — Ambulatory Visit: Payer: Medicare Other

## 2019-07-04 DIAGNOSIS — C61 Malignant neoplasm of prostate: Secondary | ICD-10-CM | POA: Diagnosis not present

## 2019-07-08 ENCOUNTER — Ambulatory Visit: Payer: Medicare Other

## 2019-07-08 ENCOUNTER — Ambulatory Visit
Admission: RE | Admit: 2019-07-08 | Discharge: 2019-07-08 | Disposition: A | Discharge status: Home / Self Care | Payer: Medicare Other | Source: Ambulatory Visit | Attending: Radiation Oncology | Admitting: Radiation Oncology

## 2019-07-08 ENCOUNTER — Other Ambulatory Visit: Payer: Self-pay

## 2019-07-08 DIAGNOSIS — C61 Malignant neoplasm of prostate: Secondary | ICD-10-CM | POA: Diagnosis not present

## 2019-07-09 ENCOUNTER — Ambulatory Visit
Admission: RE | Admit: 2019-07-09 | Discharge: 2019-07-09 | Disposition: A | Discharge status: Home / Self Care | Payer: Medicare Other | Source: Ambulatory Visit | Attending: Radiation Oncology | Admitting: Radiation Oncology

## 2019-07-09 ENCOUNTER — Ambulatory Visit: Payer: Medicare Other

## 2019-07-09 ENCOUNTER — Other Ambulatory Visit: Payer: Self-pay

## 2019-07-09 DIAGNOSIS — C61 Malignant neoplasm of prostate: Secondary | ICD-10-CM | POA: Diagnosis not present

## 2019-07-10 ENCOUNTER — Ambulatory Visit: Payer: Medicare Other

## 2019-07-10 ENCOUNTER — Other Ambulatory Visit: Payer: Self-pay

## 2019-07-10 ENCOUNTER — Ambulatory Visit
Admission: RE | Admit: 2019-07-10 | Discharge: 2019-07-10 | Disposition: A | Discharge status: Home / Self Care | Payer: Medicare Other | Source: Ambulatory Visit | Attending: Radiation Oncology | Admitting: Radiation Oncology

## 2019-07-10 DIAGNOSIS — C61 Malignant neoplasm of prostate: Secondary | ICD-10-CM | POA: Diagnosis not present

## 2019-07-11 ENCOUNTER — Ambulatory Visit
Admission: RE | Admit: 2019-07-11 | Discharge: 2019-07-11 | Disposition: A | Discharge status: Home / Self Care | Payer: Medicare Other | Source: Ambulatory Visit | Attending: Radiation Oncology | Admitting: Radiation Oncology

## 2019-07-11 ENCOUNTER — Ambulatory Visit: Payer: Medicare Other

## 2019-07-11 ENCOUNTER — Other Ambulatory Visit: Payer: Self-pay

## 2019-07-11 DIAGNOSIS — C61 Malignant neoplasm of prostate: Secondary | ICD-10-CM | POA: Diagnosis not present

## 2019-07-14 ENCOUNTER — Other Ambulatory Visit: Payer: Self-pay

## 2019-07-14 ENCOUNTER — Ambulatory Visit: Payer: Medicare Other

## 2019-07-14 ENCOUNTER — Ambulatory Visit
Admission: RE | Admit: 2019-07-14 | Discharge: 2019-07-14 | Disposition: A | Discharge status: Home / Self Care | Payer: Medicare Other | Source: Ambulatory Visit | Attending: Radiation Oncology | Admitting: Radiation Oncology

## 2019-07-14 DIAGNOSIS — C61 Malignant neoplasm of prostate: Secondary | ICD-10-CM | POA: Diagnosis not present

## 2019-07-15 ENCOUNTER — Other Ambulatory Visit: Payer: Self-pay

## 2019-07-15 ENCOUNTER — Encounter: Payer: Self-pay | Admitting: Radiation Oncology

## 2019-07-15 ENCOUNTER — Encounter: Payer: Self-pay | Admitting: Urology

## 2019-07-15 ENCOUNTER — Ambulatory Visit: Payer: Medicare Other

## 2019-07-15 ENCOUNTER — Encounter: Payer: Self-pay | Admitting: Medical Oncology

## 2019-07-15 ENCOUNTER — Ambulatory Visit
Admission: RE | Admit: 2019-07-15 | Discharge: 2019-07-15 | Disposition: A | Discharge status: Home / Self Care | Payer: Medicare Other | Source: Ambulatory Visit | Attending: Radiation Oncology | Admitting: Radiation Oncology

## 2019-07-15 DIAGNOSIS — C61 Malignant neoplasm of prostate: Secondary | ICD-10-CM | POA: Diagnosis not present

## 2019-07-16 ENCOUNTER — Ambulatory Visit: Payer: Medicare Other

## 2019-07-17 ENCOUNTER — Ambulatory Visit: Payer: Medicare Other

## 2019-07-18 ENCOUNTER — Ambulatory Visit: Payer: Medicare Other

## 2019-07-21 ENCOUNTER — Ambulatory Visit: Payer: Medicare Other

## 2019-07-22 ENCOUNTER — Ambulatory Visit: Payer: Medicare Other

## 2019-07-23 ENCOUNTER — Ambulatory Visit: Payer: Medicare Other

## 2019-07-24 ENCOUNTER — Ambulatory Visit: Payer: Medicare Other

## 2019-07-25 ENCOUNTER — Ambulatory Visit: Payer: Medicare Other

## 2019-07-27 NOTE — Progress Notes (Signed)
  Radiation Oncology         [phone] ________________________________  Name: Blayne Frankie MRN: [id number]  Date: 06/11/2019  DOB: 07-10-1945  3D Planning Note   Prostate Brachytherapy Post-Implant Dosimetry  Diagnosis: 74 y.o. gentleman with Stage T1c adenocarcinoma of the prostate with Gleason score of 4+5, and PSA of 11.9  Narrative: On a previous date, [place] returned following prostate seed implantation for post implant planning. He underwent CT scan complex simulation to delineate the three-dimensional structures of the pelvis and demonstrate the radiation distribution.  Since that time, the seed localization, and complex isodose planning with dose volume histograms have now been completed.  Results:   Prostate Coverage - The dose of radiation delivered to the 90% or more of the prostate gland (D90) was 106.41% of the prescription dose. This exceeds our goal of greater than 90%. Rectal Sparing - The volume of rectal tissue receiving the prescription dose or higher was 0.0 cc. This falls under our thresholds tolerance of 1.0 cc.  Impression: The prostate seed implant appears to show adequate target coverage and appropriate rectal sparing.  Plan:  The patient will continue to follow with urology for ongoing PSA determinations. I would anticipate a high likelihood for local tumor control with minimal risk for rectal morbidity.  ________________________________  Sheral Apley Tammi Klippel, M.D.

## 2019-07-28 ENCOUNTER — Ambulatory Visit: Payer: Medicare Other

## 2019-07-29 ENCOUNTER — Ambulatory Visit: Payer: Medicare Other

## 2019-07-30 ENCOUNTER — Ambulatory Visit: Payer: Medicare Other

## 2019-07-30 DIAGNOSIS — L239 Allergic contact dermatitis, unspecified cause: Secondary | ICD-10-CM | POA: Diagnosis not present

## 2019-07-30 DIAGNOSIS — S80262A Insect bite (nonvenomous), left knee, initial encounter: Secondary | ICD-10-CM | POA: Diagnosis not present

## 2019-07-30 DIAGNOSIS — L500 Allergic urticaria: Secondary | ICD-10-CM | POA: Diagnosis not present

## 2019-07-31 ENCOUNTER — Ambulatory Visit: Payer: Medicare Other

## 2019-08-01 ENCOUNTER — Ambulatory Visit: Payer: Medicare Other

## 2019-08-04 ENCOUNTER — Ambulatory Visit: Payer: Medicare Other

## 2019-08-05 ENCOUNTER — Ambulatory Visit: Payer: Medicare Other

## 2019-08-06 ENCOUNTER — Ambulatory Visit: Payer: Medicare Other

## 2019-08-07 ENCOUNTER — Ambulatory Visit: Payer: Medicare Other

## 2019-08-08 ENCOUNTER — Ambulatory Visit: Payer: Medicare Other

## 2019-08-08 NOTE — Progress Notes (Signed)
  Radiation Oncology         [phone] ________________________________  Name: Shawn Walsh MRN: [id number]  Date: 07/15/2019  DOB: 1945-06-17  End of Treatment Note  Diagnosis:   74 y.o. gentleman with Stage T1c adenocarcinoma of the prostate with Gleason score of 4+5, and PSA of 11.9     Indication for treatment:  Curative, Definitive Radiotherapy       Radiation treatment dates:   05/13/2019; 06/10/2019 - 07/15/2019  Site/dose:    05/13/2019: Insertion of radioactive I-125 seeds into the prostate gland; 110 Gy, boost therapy with placement of SpaceOAR gel.  06/10/2019 - 07/15/2019: The prostate was treated to 45 Gy in 25 fractions of 1.8 Gy, to supplement an up-front prostate seed implant boost of 110 Gy to achieve a total nominal dose of 155 Gy.  Beams/energy:   The patient was treated with IMRT using volumetric arc therapy delivering 6 MV X-rays to clockwise and counterclockwise circumferential arcs with a 90 degree collimator offset to avoid dose scalloping.  Image guidance was performed with daily cone beam CT prior to each fraction to align to gold markers in the prostate and assure proper bladder and rectal fill volumes.  Immobilization was achieved with BodyFix custom mold.  Narrative: The patient tolerated radiation treatment relatively well with some minor urinary irritation with burning at the start of his stream, hesitancy, intermittency, weak stream and increased frequency.  He also experienced modest fatigue.    Plan: The patient has completed radiation treatment. He will return to radiation oncology clinic for routine followup in one month. I advised him to call or return sooner if he has any questions or concerns related to his recovery or treatment. ________________________________  Sheral Apley. Tammi Klippel, M.D.

## 2019-08-11 ENCOUNTER — Ambulatory Visit: Payer: Medicare Other

## 2019-08-12 ENCOUNTER — Ambulatory Visit: Payer: Medicare Other

## 2019-08-13 ENCOUNTER — Ambulatory Visit: Payer: Medicare Other

## 2019-08-14 ENCOUNTER — Ambulatory Visit: Payer: Medicare Other

## 2019-08-15 ENCOUNTER — Ambulatory Visit: Payer: Medicare Other

## 2019-08-18 ENCOUNTER — Ambulatory Visit: Payer: Medicare Other

## 2019-08-19 ENCOUNTER — Telehealth: Payer: Self-pay

## 2019-08-19 NOTE — Telephone Encounter (Signed)
Called and left a voicemail message in regards to telephone appointment with Shawn Caldron PA on 08/20/19 at 3:30. Called to review meaningful use. TM

## 2019-08-20 ENCOUNTER — Other Ambulatory Visit: Payer: Self-pay

## 2019-08-20 ENCOUNTER — Ambulatory Visit
Admission: RE | Admit: 2019-08-20 | Discharge: 2019-08-20 | Disposition: A | Discharge status: Home / Self Care | Payer: Medicare Other | Source: Ambulatory Visit | Attending: Urology | Admitting: Urology

## 2019-08-20 DIAGNOSIS — C61 Malignant neoplasm of prostate: Secondary | ICD-10-CM

## 2019-08-20 NOTE — Progress Notes (Signed)
Radiation Oncology         [phone] ________________________________  Name: Shawn Walsh MRN: [id number]  Date: 08/20/2019  DOB: [birth year]  Post Treatment Note  CC: Hulan Fess, MD  Festus Aloe, MD  Diagnosis:   [phone].o. gentleman with Stage T1c adenocarcinoma of the prostate with Gleason score of 4+5, and PSA of 11.9     Interval Since Last Radiation:  5 weeks (concurrent with LT-ADT-started 03/12/2019)  05/13/2019; 06/10/2019 - 07/15/2019:   05/13/2019: Insertion of radioactive I-125 seeds into the prostate gland;110Gy, boost therapy with placement of SpaceOAR gel.  06/10/2019 - 07/15/2019: The prostate was treated to 45 Gy in 25 fractions of 1.8 Gy, to supplement an up-front prostate seed implant boost of 110 Gy to achieve a total nominal dose of 155 Gy.   Narrative:  I spoke with the patient to conduct his routine scheduled 1 month follow up visit via telephone to spare the patient unnecessary potential exposure in the healthcare setting during the current COVID-19 pandemic.  The patient was notified in advance and gave permission to proceed with this visit format. He tolerated radiation treatment relatively well with some minor urinary irritation with burning at the start of his stream, hesitancy, intermittency, weak stream and increased frequency.  He also experienced modest fatigue.                                On review of systems, the patient states that he continues with bothersome dysuria at the start of his stream, weak stream, hesitancy, intermittency, straining to void and feelings of incomplete emptying despite taking alfuzosin as prescribed.  He has nocturia 4 times per night but denies gross hematuria, fever, chills or malodorous urine.  He has continued with hot flashes and fatigue associated with his ADT but otherwise tolerates this well.  He reports a healthy appetite and is maintaining his weight.  He denies abdominal pain, nausea, vomiting, diarrhea or  constipation.  ALLERGIES:  is allergic to amoxicillin-pot clavulanate and avelox [moxifloxacin hcl in nacl].  Meds: Current Outpatient Medications  Medication Sig Dispense Refill  . Cholecalciferol (VITAMIN D3) 1000 UNITS CAPS Take by mouth daily.     Marland Kitchen LORazepam (ATIVAN) 1 MG tablet Take 1 tablet (1 mg total) by mouth as needed for anxiety (take one tablet 30 minutes prior to MRI and may repeat once, just prior to scan if needed.). 2 tablet 0  . Multiple Vitamins-Minerals (CENTRUM SILVER PO) Take by mouth daily.     . rosuvastatin (CRESTOR) 5 MG tablet Take 5 mg by mouth.      No current facility-administered medications for this encounter.    Physical Findings:  vitals were not taken for this visit.   /Unable to assess due to telephone follow-up visit format.  Lab Findings: Lab Results  Component Value Date   WBC 5.2 05/09/2019   HGB 15.0 05/09/2019   HCT 46.1 05/09/2019   MCV 95.1 05/09/2019   PLT 207 05/09/2019     Radiographic Findings: No results found.  Impression/Plan: 1. 74y.o. gentleman with Stage T1c adenocarcinoma of the prostate with Gleason score of 4+5, and PSA of 11.9. He will continue to follow up with urology for ongoing PSA determinations and has an appointment scheduled with Dr. Junious Silk next week. He understands what to expect with regards to PSA monitoring going forward.  It sounds like he may be having some bladder spasms so I have recommended that  he try using over-the-counter AZO as needed for added relief.  He will continue using alfuzosin as prescribed.  I will look forward to following his response to treatment via correspondence with urology, and would be happy to continue to participate in his care if clinically indicated. I talked to the patient about what to expect in the future, including his risk for erectile dysfunction and rectal bleeding. I encouraged him to call or return to the office if he has any questions regarding his previous radiation  or possible radiation side effects. He was comfortable with this plan and will follow up as needed.  Today, a comprehensive survivorship care plan and treatment summary was reviewed with the patient today detailing his prostate cancer diagnosis, treatment course, potential late/long-term effects of treatment, appropriate follow-up care with recommendations for the future, and patient education resources.  A copy of this summary, along with a letter will be sent to the patient's primary care provider via mail/fax/In Basket message after today's visit.   2. Cancer screening:  Due to Mr. Miguez's history and his age, he should receive screening for skin cancers and colon cancer.  The information and recommendations are listed on the patient's comprehensive care plan/treatment summary and were reviewed in detail with the patient.     3. Health maintenance and wellness promotion: Mr. Kramp was encouraged to consume 5-7 servings of fruits and vegetables per day. He was provided a copy of the "Nutrition Rainbow" handout, as well as the handout "Take Control of Your Health and [place]" from the [facility].  He was also encouraged to engage in moderate to vigorous exercise for 30 minutes per day most days of the week. Information was provided regarding the [employer] [facility] fitness program, which is designed for cancer survivors to help them become more physically fit after cancer treatments. We discussed that a healthy BMI is 18.5-24.9 and that maintaining a healthy weight reduces risk of cancer recurrences.  He was instructed to limit his alcohol consumption and continue to abstain from tobacco use.  Lastly, he was encouraged to use sunscreen and wear protective clothing when in the sun.     4. Support services/counseling: It is not uncommon for this period of the patient's cancer care trajectory to be one of many emotions and stressors.  Mr. Pruss was encouraged to take advantage  of our many support services programs, support groups, and/or counseling in coping with his new life as a cancer survivor after completing anti-cancer treatment.  He was offered support today through active listening and expressive supportive counseling.  He was given information regarding our available services and encouraged to contact me with any questions or for help enrolling in any of our support group/programs.       Nicholos Johns, PA-C

## 2019-08-27 DIAGNOSIS — C61 Malignant neoplasm of prostate: Secondary | ICD-10-CM | POA: Diagnosis not present

## 2019-08-27 DIAGNOSIS — R3912 Poor urinary stream: Secondary | ICD-10-CM | POA: Diagnosis not present

## 2019-09-04 DIAGNOSIS — H353131 Nonexudative age-related macular degeneration, bilateral, early dry stage: Secondary | ICD-10-CM | POA: Diagnosis not present

## 2019-09-04 DIAGNOSIS — H5203 Hypermetropia, bilateral: Secondary | ICD-10-CM | POA: Diagnosis not present

## 2019-09-04 DIAGNOSIS — H04123 Dry eye syndrome of bilateral lacrimal glands: Secondary | ICD-10-CM | POA: Diagnosis not present

## 2019-09-04 DIAGNOSIS — H52223 Regular astigmatism, bilateral: Secondary | ICD-10-CM | POA: Diagnosis not present

## 2019-09-04 DIAGNOSIS — H2513 Age-related nuclear cataract, bilateral: Secondary | ICD-10-CM | POA: Diagnosis not present

## 2019-09-04 DIAGNOSIS — H524 Presbyopia: Secondary | ICD-10-CM | POA: Diagnosis not present

## 2019-10-27 DIAGNOSIS — Z23 Encounter for immunization: Secondary | ICD-10-CM | POA: Diagnosis not present

## 2019-11-10 DIAGNOSIS — L905 Scar conditions and fibrosis of skin: Secondary | ICD-10-CM | POA: Diagnosis not present

## 2020-01-07 DIAGNOSIS — Z79899 Other long term (current) drug therapy: Secondary | ICD-10-CM | POA: Diagnosis not present

## 2020-01-07 DIAGNOSIS — Z125 Encounter for screening for malignant neoplasm of prostate: Secondary | ICD-10-CM | POA: Diagnosis not present

## 2020-01-07 DIAGNOSIS — E7800 Pure hypercholesterolemia, unspecified: Secondary | ICD-10-CM | POA: Diagnosis not present

## 2020-01-07 DIAGNOSIS — R413 Other amnesia: Secondary | ICD-10-CM | POA: Diagnosis not present

## 2020-01-14 DIAGNOSIS — Z0000 Encounter for general adult medical examination without abnormal findings: Secondary | ICD-10-CM | POA: Diagnosis not present

## 2020-01-14 DIAGNOSIS — N1831 Chronic kidney disease, stage 3a: Secondary | ICD-10-CM | POA: Diagnosis not present

## 2020-01-14 DIAGNOSIS — H6122 Impacted cerumen, left ear: Secondary | ICD-10-CM | POA: Diagnosis not present

## 2020-01-14 DIAGNOSIS — Z79899 Other long term (current) drug therapy: Secondary | ICD-10-CM | POA: Diagnosis not present

## 2020-01-14 DIAGNOSIS — Z23 Encounter for immunization: Secondary | ICD-10-CM | POA: Diagnosis not present

## 2020-01-14 DIAGNOSIS — S91112A Laceration without foreign body of left great toe without damage to nail, initial encounter: Secondary | ICD-10-CM | POA: Diagnosis not present

## 2020-01-14 DIAGNOSIS — Z87442 Personal history of urinary calculi: Secondary | ICD-10-CM | POA: Diagnosis not present

## 2020-01-14 DIAGNOSIS — E7800 Pure hypercholesterolemia, unspecified: Secondary | ICD-10-CM | POA: Diagnosis not present

## 2020-01-14 DIAGNOSIS — Z1211 Encounter for screening for malignant neoplasm of colon: Secondary | ICD-10-CM | POA: Diagnosis not present

## 2020-01-14 DIAGNOSIS — Z8546 Personal history of malignant neoplasm of prostate: Secondary | ICD-10-CM | POA: Diagnosis not present

## 2020-01-21 DIAGNOSIS — N1831 Chronic kidney disease, stage 3a: Secondary | ICD-10-CM | POA: Diagnosis not present

## 2020-01-21 DIAGNOSIS — N200 Calculus of kidney: Secondary | ICD-10-CM | POA: Diagnosis not present

## 2020-02-12 DIAGNOSIS — U071 COVID-19: Secondary | ICD-10-CM | POA: Diagnosis not present

## 2020-03-03 DIAGNOSIS — C61 Malignant neoplasm of prostate: Secondary | ICD-10-CM | POA: Diagnosis not present

## 2020-04-05 ENCOUNTER — Ambulatory Visit (AMBULATORY_SURGERY_CENTER): Payer: Self-pay | Admitting: *Deleted

## 2020-04-05 ENCOUNTER — Other Ambulatory Visit: Payer: Self-pay

## 2020-04-05 VITALS — Ht 67.0 in | Wt 152.0 lb

## 2020-04-05 DIAGNOSIS — Z1211 Encounter for screening for malignant neoplasm of colon: Secondary | ICD-10-CM

## 2020-04-05 NOTE — Progress Notes (Signed)
No egg or soy allergy known to patient  No issues with past sedation with any surgeries or procedures Patient denies ever being told they had issues or difficulty with intubation  No FH of Malignant Hyperthermia No diet pills per patient No home 02 use per patient  No blood thinners per patient  Pt STATES  issues with constipation ALMOST DAILY -  HAS A BM DAILY BUT ARE MORE HARD -HARD BALLS AT TIMES - PT DOS NOT DRINK ALOT OF WATER - NO MEDS TO HELP HAVE A BM OVER THE COUNTER - NO BLEEDING, SOME ABD PAIN IN THE EVENINGS AND HE FEELS FULL - 2 day prep  No A fib or A flutter  EMMI video to pt or via [place] 19 guidelines implemented in PV today with Pt and RN  Pt is fully vaccinated  for Covid    Due to the COVID-19 pandemic we are asking patients to follow certain guidelines.  Pt aware of COVID protocols and [facility] guidelines

## 2020-04-13 DIAGNOSIS — M792 Neuralgia and neuritis, unspecified: Secondary | ICD-10-CM | POA: Diagnosis not present

## 2020-04-13 DIAGNOSIS — M79609 Pain in unspecified limb: Secondary | ICD-10-CM | POA: Diagnosis not present

## 2020-04-13 DIAGNOSIS — R1013 Epigastric pain: Secondary | ICD-10-CM | POA: Diagnosis not present

## 2020-04-13 DIAGNOSIS — R5383 Other fatigue: Secondary | ICD-10-CM | POA: Diagnosis not present

## 2020-04-15 ENCOUNTER — Encounter (HOSPITAL_BASED_OUTPATIENT_CLINIC_OR_DEPARTMENT_OTHER): Payer: Self-pay | Admitting: *Deleted

## 2020-04-15 ENCOUNTER — Emergency Department (HOSPITAL_BASED_OUTPATIENT_CLINIC_OR_DEPARTMENT_OTHER): Payer: Medicare Other

## 2020-04-15 ENCOUNTER — Other Ambulatory Visit: Payer: Self-pay

## 2020-04-15 ENCOUNTER — Emergency Department (HOSPITAL_BASED_OUTPATIENT_CLINIC_OR_DEPARTMENT_OTHER)
Admission: EM | Admit: 2020-04-15 | Discharge: 2020-04-15 | Disposition: A | Discharge status: Home / Self Care | Payer: Medicare Other | Attending: Emergency Medicine | Admitting: Emergency Medicine

## 2020-04-15 DIAGNOSIS — R102 Pelvic and perineal pain: Secondary | ICD-10-CM

## 2020-04-15 DIAGNOSIS — Z8546 Personal history of malignant neoplasm of prostate: Secondary | ICD-10-CM | POA: Insufficient documentation

## 2020-04-15 DIAGNOSIS — Z79899 Other long term (current) drug therapy: Secondary | ICD-10-CM | POA: Diagnosis not present

## 2020-04-15 DIAGNOSIS — R112 Nausea with vomiting, unspecified: Secondary | ICD-10-CM | POA: Diagnosis not present

## 2020-04-15 DIAGNOSIS — R1032 Left lower quadrant pain: Secondary | ICD-10-CM | POA: Diagnosis not present

## 2020-04-15 DIAGNOSIS — K760 Fatty (change of) liver, not elsewhere classified: Secondary | ICD-10-CM | POA: Diagnosis not present

## 2020-04-15 DIAGNOSIS — N3090 Cystitis, unspecified without hematuria: Secondary | ICD-10-CM | POA: Insufficient documentation

## 2020-04-15 DIAGNOSIS — Z8616 Personal history of COVID-19: Secondary | ICD-10-CM | POA: Diagnosis not present

## 2020-04-15 DIAGNOSIS — R109 Unspecified abdominal pain: Secondary | ICD-10-CM | POA: Diagnosis not present

## 2020-04-15 LAB — COMPREHENSIVE METABOLIC PANEL
ALT: 17 U/L (ref 0–44)
AST: 21 U/L (ref 15–41)
Albumin: 3.4 g/dL — ABNORMAL LOW (ref 3.5–5.0)
Alkaline Phosphatase: 92 U/L (ref 38–126)
Anion gap: 9 (ref 5–15)
BUN: 21 mg/dL (ref 8–23)
CO2: 28 mmol/L (ref 22–32)
Calcium: 9 mg/dL (ref 8.9–10.3)
Chloride: 102 mmol/L (ref 98–111)
Creatinine, Ser: 1.12 mg/dL (ref 0.61–1.24)
GFR, Estimated: >60 mL/min (ref >60)
Glucose, Bld: 131 mg/dL — ABNORMAL HIGH (ref 70–99)
Potassium: 3.8 mmol/L (ref 3.5–5.1)
Sodium: 139 mmol/L (ref 135–145)
Total Bilirubin: 0.4 mg/dL (ref 0.3–1.2)
Total Protein: 6.5 g/dL (ref 6.5–8.1)

## 2020-04-15 LAB — CBC WITH DIFFERENTIAL/PLATELET
Abs Immature Granulocytes: 0.01 10*3/uL (ref 0.00–0.07)
Basophils Absolute: 0 10*3/uL (ref 0.0–0.1)
Basophils Relative: 1 %
Eosinophils Absolute: 0.4 10*3/uL (ref 0.0–0.5)
Eosinophils Relative: 7 %
HCT: 41.4 % (ref 39.0–52.0)
Hemoglobin: 13.8 g/dL (ref 13.0–17.0)
Immature Granulocytes: 0 %
Lymphocytes Relative: 23 %
Lymphs Abs: 1.3 10*3/uL (ref 0.7–4.0)
MCH: 31.4 pg (ref 26.0–34.0)
MCHC: 33.3 g/dL (ref 30.0–36.0)
MCV: 94.1 fL (ref 80.0–100.0)
Monocytes Absolute: 0.4 10*3/uL (ref 0.1–1.0)
Monocytes Relative: 7 %
Neutro Abs: 3.4 10*3/uL (ref 1.7–7.7)
Neutrophils Relative %: 62 %
Platelets: 234 10*3/uL (ref 150–400)
RBC: 4.4 MIL/uL (ref 4.22–5.81)
RDW: 13.1 % (ref 11.5–15.5)
WBC: 5.4 10*3/uL (ref 4.0–10.5)
nRBC: 0 % (ref 0.0–0.2)

## 2020-04-15 LAB — URINALYSIS, ROUTINE W REFLEX MICROSCOPIC
Bilirubin Urine: NEGATIVE
Glucose, UA: NEGATIVE mg/dL
Hgb urine dipstick: NEGATIVE
Ketones, ur: NEGATIVE mg/dL
Leukocytes,Ua: NEGATIVE
Nitrite: NEGATIVE
Protein, ur: NEGATIVE mg/dL
Specific Gravity, Urine: 1.02 (ref 1.005–1.030)
pH: 6.5 (ref 5.0–8.0)

## 2020-04-15 LAB — LIPASE, BLOOD: Lipase: 37 U/L (ref 11–51)

## 2020-04-15 MED ORDER — IOHEXOL 300 MG/ML  SOLN
100.0000 mL | Freq: Once | INTRAMUSCULAR | Status: AC | PRN
  Administered 2020-04-15: 100 mL via INTRAVENOUS

## 2020-04-15 NOTE — Discharge Instructions (Signed)
Your imaging shows some inflammation in your bladder, but we did not see any signs of infection in your urine.  We will send it for a culture to be sure.  You do have a small kidney stone, but this should not be causing your pain.  You can take tylenol as needed for your pain.  You should call your urologist on Monday to scheduled an appointment.  If your pain comes back and isn't improving at home or if you are unable to urinate, you should be seen by a doctor right away.

## 2020-04-15 NOTE — ED Triage Notes (Signed)
Abdominal pain for 3 days. He has been here for the same pain.

## 2020-04-15 NOTE — ED Notes (Signed)
Pt. Reports he is here due to a pain that comes and goes that feels like a spasm in the lower abd.  Pt. Feels like it is his poss. Related to kidney stones in his past.

## 2020-04-15 NOTE — ED Provider Notes (Signed)
[facility] Provider Note   CSN: [id number] Arrival date & time: 04/15/20  2006     History Chief Complaint  Patient presents with  . Abdominal Pain    Shawn Walsh is a 75 y.o. male.   Patient is a 75 year old male with a history of prostatic cancer status post radiation and testosterone treatment who presents with suprapubic abdominal pain that radiates to both lower quadrants for 2 weeks.  He reports this has been getting progressively worse.  Usually occurs in the afternoons and worsens into the evenings.  Describes it as a cramping sensation that sometimes gets very severe and causes him to feel like as though he will vomit.  He states that he has not had any episodes of emesis.  States that he has not noticed anything makes the pain better or worse.  Has not had a change in his appetite.  He has been able to urinate without difficulty.  He has not noticed any blood in his urine.  No fevers.  He states that he had similar symptoms 4 years ago when he had kidney stones.  Pain was worse today, lasted for a few hours.  He reports that presently his pain is well controlled, only a slight cramping sensation.  He reports that he has been moving his bowels well, last bowel movement was today.  This did not change his pain at all.  He denies any diarrhea.  Denies any chest pain, shortness of breath.  No known sick contacts.  He has not taken anything for the pain.        Past Medical History:  Diagnosis Date  . Arthritis    MILD   . COVID-19 virus infection    02-2020  . History of kidney stones   . HLD (hyperlipidemia)   . Nephrolithiasis    per renal ultrasound 01-02-2019 in epic, left 51mm  . Prostate cancer [facility]) urologist-- dr eskridge/  dr Tammi Klippel   dx 01-14-2019, Stage T1, Gleason 4+5  . Wears glasses     Patient Active Problem List   Diagnosis Date Noted  . Malignant neoplasm of prostate ([place]) 02/18/2019  . Impacted cerumen of both ears  11/19/2017  . Abdominal pain, LLQ (left lower quadrant) 02/19/2012  . Nausea with vomiting, possible intermittent SBO 02/19/2012  . Dyspepsia 02/14/2011    Past Surgical History:  Procedure Laterality Date  . ABDOMINAL ADHESION SURGERY  1976   [place]  . APPENDECTOMY  1972  . COLONOSCOPY  01/06/2002   normal  . COLONOSCOPY  01/06/2002  . CYSTOSCOPY N/A 05/13/2019   Procedure: CYSTOSCOPY FLEXIBLE;  Surgeon: Festus Aloe, MD;  Location: [facility];  Service: Urology;  Laterality: N/A;  NO SEEDS FOUND IN BLADDER  . RADIOACTIVE SEED IMPLANT N/A 05/13/2019   Procedure: RADIOACTIVE SEED IMPLANT/BRACHYTHERAPY IMPLANT;  Surgeon: Festus Aloe, MD;  Location: [facility];  Service: Urology;  Laterality: N/A;   37  SEEDS IMPLANTED  . SPACE OAR INSTILLATION N/A 05/13/2019   Procedure: SPACE OAR INSTILLATION;  Surgeon: Festus Aloe, MD;  Location: [facility];  Service: Urology;  Laterality: N/A;       Family History  Problem Relation Age of Onset  . Hyperlipidemia Brother   . Colon cancer Neg Hx   . Prostate cancer Neg Hx   . Pancreatic cancer Neg Hx   . Breast cancer Neg Hx   . Colon polyps Neg Hx   . Esophageal cancer Neg Hx  Social History   Tobacco Use  . Smoking status: Never Smoker  . Smokeless tobacco: Never Used  Vaping Use  . Vaping Use: Never used  Substance Use Topics  . Alcohol use: No  . Drug use: Never    Home Medications Prior to Admission medications   Medication Sig Start Date End Date Taking? Authorizing Provider  Cholecalciferol (VITAMIN D3) 1000 UNITS CAPS Take by mouth daily.    Yes [provider]  Multiple Vitamins-Minerals (CENTRUM SILVER PO) Take by mouth daily.    Yes [provider]  rosuvastatin (CRESTOR) 5 MG tablet Take 5 mg by mouth.  12/25/18  Yes [provider]    Allergies    Amoxicillin, Amoxicillin-pot clavulanate, Avelox [moxifloxacin hcl in nacl], and  Nsaids  Review of Systems   Review of Systems  Constitutional: Negative for activity change, appetite change, chills, fatigue and fever.  HENT: Negative for congestion and sore throat.   Eyes: Negative for visual disturbance.  Respiratory: Negative for cough and shortness of breath.   Cardiovascular: Negative for chest pain, palpitations and leg swelling.  Gastrointestinal: Positive for abdominal pain and nausea. Negative for blood in stool, constipation, diarrhea and vomiting.  Genitourinary: Negative for decreased urine volume, difficulty urinating (does report nocturia 2 times nightly, which is chronic), dysuria, flank pain, hematuria, penile pain, testicular pain and urgency.  Musculoskeletal: Negative for back pain.  Skin: Negative for rash.  Neurological: Negative for dizziness, weakness and headaches.    Physical Exam Updated Vital Signs BP (!) 111/96   Pulse 78   Temp 98.4 F (36.9 C) (Oral)   Resp 18   Ht 5\' 7"  (1.702 m)   Wt 68.9 kg   SpO2 98%   BMI 23.79 kg/m   Physical Exam Vitals reviewed.  Constitutional:      General: He is not in acute distress.    Appearance: He is well-developed. He is not ill-appearing.  HENT:     Head: Normocephalic and atraumatic.  Cardiovascular:     Rate and Rhythm: Normal rate and regular rhythm.     Heart sounds: No murmur heard. No friction rub. No gallop.   Pulmonary:     Effort: Pulmonary effort is normal. No respiratory distress.     Breath sounds: Normal breath sounds. No wheezing, rhonchi or rales.  Abdominal:     General: Bowel sounds are normal. There is distension (mild).     Palpations: Abdomen is soft. There is no fluid wave or mass.     Tenderness: There is abdominal tenderness in the suprapubic area. There is no right CVA tenderness, left CVA tenderness, guarding or rebound. Negative signs include Murphy's sign, Rovsing's sign and McBurney's sign.  Skin:    General: Skin is warm and dry.  Neurological:      General: No focal deficit present.     Mental Status: He is alert and oriented to person, place, and time.  Psychiatric:        Mood and Affect: Mood normal.        Behavior: Behavior normal.     ED Results / Procedures / Treatments   Labs (all labs ordered are listed, but only abnormal results are displayed) Labs Reviewed  COMPREHENSIVE METABOLIC PANEL - Abnormal; Notable for the following components:      Result Value   Glucose, Bld 131 (*)    Albumin 3.4 (*)    All other components within normal limits  URINE CULTURE  URINALYSIS, ROUTINE W REFLEX MICROSCOPIC  CBC WITH DIFFERENTIAL/PLATELET  LIPASE, BLOOD    EKG None  Radiology CT ABDOMEN PELVIS W CONTRAST  Result Date: 04/15/2020 CLINICAL DATA:  Lower quadrant abdominal pain, nephrolithiasis, prostate cancer EXAM: CT ABDOMEN AND PELVIS WITH CONTRAST TECHNIQUE: Multidetector CT imaging of the abdomen and pelvis was performed using the standard protocol following bolus administration of intravenous contrast. CONTRAST:  14mL OMNIPAQUE IOHEXOL 300 MG/ML  SOLN COMPARISON:  01/30/2019 FINDINGS: Lower chest: The visualized lung bases are clear bilaterally. The visualized heart and pericardium are unremarkable. Hepatobiliary: Mild hepatic steatosis. No enhancing liver lesion. No intra or extrahepatic biliary ductal dilation. Gallbladder unremarkable. Pancreas: Unremarkable Spleen: Unremarkable Adrenals/Urinary Tract: The adrenal glands are unremarkable. The kidneys are normal in size and position. 7 mm nonobstructing calculus within the interpolar region of the left kidney. No ureteral calculi. No hydronephrosis. No enhancing renal mass. No perinephric inflammatory stranding or fluid collections are identified. The bladder is largely decompressed. There is, however, circumferential bladder wall thickening and mild perivesicular inflammatory stranding suggesting changes of a diffuse infectious or inflammatory cystitis. Stomach/Bowel: The  stomach, small bowel, and large bowel are unremarkable. No evidence of obstruction or focal inflammation. The appendix is absent. No free intraperitoneal gas or fluid. Vascular/Lymphatic: No significant vascular findings are present. No enlarged abdominal or pelvic lymph nodes. Reproductive: Brachytherapy seeds are seen within the prostate gland. Seminal vesicles are unremarkable. Other: Tiny fat containing umbilical hernia. The rectum is unremarkable. Musculoskeletal: Bilateral L5 pars defects are present. No cyst seated spondylolisthesis. No lytic or blastic bone lesions are identified. IMPRESSION: Diffuse bladder wall thickening and mild perivesicular inflammatory stranding in keeping with diffuse infectious or inflammatory cystitis. Correlation with urinalysis and urine culture may be helpful. Mild left nonobstructing nephrolithiasis. Mild hepatic steatosis. Electronically Signed   By: Fidela Salisbury MD   On: 04/15/2020 22:53    Procedures Procedures   Medications Ordered in ED Medications  iohexol (OMNIPAQUE) 300 MG/ML solution 100 mL (100 mLs Intravenous Contrast Given 04/15/20 2229)    ED Course  I have reviewed the triage vital signs and the nursing notes.  Pertinent labs & imaging results that were available during my care of the patient were reviewed by me and considered in my medical decision making (see chart for details).    MDM Rules/Calculators/A&P                          Patient is a 75 yo male with PMH prostate cancer s/p radiation who presents with two weeks of lower abdominal pain.  Pain is in suprapubic region and occasionally radiates to both sides.  Relates pain to similar feeling of when he had kidney stones.  Has no hematuria or CVA tenderness.  UA without signs of infection, therefore doubt UTI or prostatitis.  He does not have a fever, unlikely infectious process.  CBC also WNL.  CMP, specifically Cr, and Lipase WNL.  Vitals are stable.  Bedside US for urinary retention  performed with Dr. Rex Kras, which shows slightly increased urine in bladder, unable to perform measurements.  Patient was able to void afterwards with no bladder distention noted on [place].  Given this, no signs of acute urinary retention, likely has some chronic BPH given his history.  CT Abd/pelvis with mild left nonobstructing nephrolithiasis, but unlikely the cause of his pain.  Did show diffuse bladder wall thickening and mild perivesicular inflammatory stranding.  This is less likely infectious given UA without abnormalities, but will order urine culture  to confirm.  His vitals have remained stable.  Discussed CT findings with the patient and that this could be post-radiation changes or inflammatory cystitis, and should follow up with urologist next week.  Can use tylenol as needed for his pain.  He voiced understanding.  He was discharged home in stable condition and will f/u with urology.  Advised to return to care if significant worsening in pain or acute urinary retention.   Final Clinical Impression(s) / ED Diagnoses Final diagnoses:  Suprapubic pain  Inflammation of bladder    Rx / DC Orders ED Discharge Orders    None       Cleophas Dunker, DO 04/15/20 2319    Little, Wenda Overland, MD 04/19/20 [phone]

## 2020-04-17 LAB — URINE CULTURE: Culture: <10000 — AB

## 2020-04-19 ENCOUNTER — Encounter: Payer: Medicare Other | Admitting: Internal Medicine

## 2020-04-22 DIAGNOSIS — R112 Nausea with vomiting, unspecified: Secondary | ICD-10-CM | POA: Diagnosis not present

## 2020-04-22 DIAGNOSIS — R109 Unspecified abdominal pain: Secondary | ICD-10-CM | POA: Diagnosis not present

## 2020-04-22 DIAGNOSIS — A084 Viral intestinal infection, unspecified: Secondary | ICD-10-CM | POA: Diagnosis not present

## 2020-04-22 DIAGNOSIS — Z03818 Encounter for observation for suspected exposure to other biological agents ruled out: Secondary | ICD-10-CM | POA: Diagnosis not present

## 2020-05-03 DIAGNOSIS — R109 Unspecified abdominal pain: Secondary | ICD-10-CM | POA: Diagnosis not present

## 2020-05-03 DIAGNOSIS — Z09 Encounter for follow-up examination after completed treatment for conditions other than malignant neoplasm: Secondary | ICD-10-CM | POA: Diagnosis not present

## 2020-05-03 DIAGNOSIS — B9681 Helicobacter pylori [H. pylori] as the cause of diseases classified elsewhere: Secondary | ICD-10-CM | POA: Diagnosis not present

## 2020-05-11 DIAGNOSIS — Z23 Encounter for immunization: Secondary | ICD-10-CM | POA: Diagnosis not present

## 2020-05-19 DIAGNOSIS — Z1211 Encounter for screening for malignant neoplasm of colon: Secondary | ICD-10-CM | POA: Diagnosis not present

## 2020-05-19 DIAGNOSIS — Z8619 Personal history of other infectious and parasitic diseases: Secondary | ICD-10-CM | POA: Diagnosis not present

## 2020-05-19 DIAGNOSIS — R109 Unspecified abdominal pain: Secondary | ICD-10-CM | POA: Diagnosis not present

## 2020-05-19 DIAGNOSIS — R142 Eructation: Secondary | ICD-10-CM | POA: Diagnosis not present

## 2020-06-16 DIAGNOSIS — K644 Residual hemorrhoidal skin tags: Secondary | ICD-10-CM | POA: Diagnosis not present

## 2020-06-16 DIAGNOSIS — Z8619 Personal history of other infectious and parasitic diseases: Secondary | ICD-10-CM | POA: Diagnosis not present

## 2020-06-16 DIAGNOSIS — K648 Other hemorrhoids: Secondary | ICD-10-CM | POA: Diagnosis not present

## 2020-06-16 DIAGNOSIS — B9681 Helicobacter pylori [H. pylori] as the cause of diseases classified elsewhere: Secondary | ICD-10-CM | POA: Diagnosis not present

## 2020-06-16 DIAGNOSIS — K2100 Gastro-esophageal reflux disease with esophagitis, without bleeding: Secondary | ICD-10-CM | POA: Diagnosis not present

## 2020-06-16 DIAGNOSIS — Z1211 Encounter for screening for malignant neoplasm of colon: Secondary | ICD-10-CM | POA: Diagnosis not present

## 2020-06-16 DIAGNOSIS — K6289 Other specified diseases of anus and rectum: Secondary | ICD-10-CM | POA: Diagnosis not present

## 2020-06-16 DIAGNOSIS — K2930 Chronic superficial gastritis without bleeding: Secondary | ICD-10-CM | POA: Diagnosis not present

## 2020-06-22 DIAGNOSIS — K2930 Chronic superficial gastritis without bleeding: Secondary | ICD-10-CM | POA: Diagnosis not present

## 2020-06-22 DIAGNOSIS — B9681 Helicobacter pylori [H. pylori] as the cause of diseases classified elsewhere: Secondary | ICD-10-CM | POA: Diagnosis not present

## 2020-07-02 ENCOUNTER — Encounter: Payer: Medicare Other | Admitting: Internal Medicine

## 2020-08-23 DIAGNOSIS — H5203 Hypermetropia, bilateral: Secondary | ICD-10-CM | POA: Diagnosis not present

## 2020-08-23 DIAGNOSIS — H04123 Dry eye syndrome of bilateral lacrimal glands: Secondary | ICD-10-CM | POA: Diagnosis not present

## 2020-08-23 DIAGNOSIS — H52223 Regular astigmatism, bilateral: Secondary | ICD-10-CM | POA: Diagnosis not present

## 2020-08-23 DIAGNOSIS — H524 Presbyopia: Secondary | ICD-10-CM | POA: Diagnosis not present

## 2020-08-23 DIAGNOSIS — H353131 Nonexudative age-related macular degeneration, bilateral, early dry stage: Secondary | ICD-10-CM | POA: Diagnosis not present

## 2020-08-23 DIAGNOSIS — H2513 Age-related nuclear cataract, bilateral: Secondary | ICD-10-CM | POA: Diagnosis not present

## 2020-09-01 DIAGNOSIS — C61 Malignant neoplasm of prostate: Secondary | ICD-10-CM | POA: Diagnosis not present

## 2020-09-09 DIAGNOSIS — N401 Enlarged prostate with lower urinary tract symptoms: Secondary | ICD-10-CM | POA: Diagnosis not present

## 2020-09-09 DIAGNOSIS — C61 Malignant neoplasm of prostate: Secondary | ICD-10-CM | POA: Diagnosis not present

## 2020-09-09 DIAGNOSIS — R351 Nocturia: Secondary | ICD-10-CM | POA: Diagnosis not present

## 2020-09-09 DIAGNOSIS — A048 Other specified bacterial intestinal infections: Secondary | ICD-10-CM | POA: Diagnosis not present

## 2020-10-20 DIAGNOSIS — Z23 Encounter for immunization: Secondary | ICD-10-CM | POA: Diagnosis not present

## 2020-10-25 DIAGNOSIS — Z23 Encounter for immunization: Secondary | ICD-10-CM | POA: Diagnosis not present

## 2020-11-16 DIAGNOSIS — B9681 Helicobacter pylori [H. pylori] as the cause of diseases classified elsewhere: Secondary | ICD-10-CM | POA: Diagnosis not present

## 2021-01-12 ENCOUNTER — Ambulatory Visit (INDEPENDENT_AMBULATORY_CARE_PROVIDER_SITE_OTHER): Payer: Medicare Other | Admitting: Diagnostic Neuroimaging

## 2021-01-12 ENCOUNTER — Encounter: Payer: Self-pay | Admitting: Diagnostic Neuroimaging

## 2021-01-12 VITALS — BP 125/70 | HR 78 | Ht 67.0 in | Wt 153.0 lb

## 2021-01-12 DIAGNOSIS — R200 Anesthesia of skin: Secondary | ICD-10-CM

## 2021-01-12 NOTE — Patient Instructions (Signed)
MILD NUMBNESS IN RIGHT 2nd TOE - likely small local nerve irritation vs neuroma vs tendonitis; neuro exam normal; no evidence of underlying widespread neuropathy - consider topical creams (diclofenac, lidocaine) - consider podiatry evaluation

## 2021-01-12 NOTE — Progress Notes (Signed)
[facility]  PATIENT: Shawn Walsh DOB: 18-Feb-1945  REFERRING CLINICIAN: Lawerance Cruel, MD HISTORY FROM: patient  REASON FOR VISIT: new consult    HISTORICAL  CHIEF COMPLAINT:  Chief Complaint  Patient presents with   Numbness    RM 7 alone Pt is well, having some numbness, tingling in R foot. Bothers him more at night for more than a yr now.     HISTORY OF PRESENT ILLNESS:   76 year old male here for evaluation of numbness in the right second toe.  Symptoms started about a year ago.  Symptoms bother him later in the evening when he is trying to sleep.  He feels a numbness and pain on the second toe superior aspect.  No tenderness to palpation or movement.  No other problems with the other toes or left foot.  No low back pain.  No problems with fingers or hands.  Patient has remote injury to the right second toe when he was a child and may have "stubbed it".  Patient has a gabapentin but this did not help.  He has not tried any topical agents.  He has not seen podiatry.   REVIEW OF SYSTEMS: Full 14 system review of systems performed and negative with exception of: as per HPI.  ALLERGIES: Allergies  Allergen Reactions   Amoxicillin     Other reaction(s): vomiting   Amoxicillin-Pot Clavulanate Nausea And Vomiting   Avelox [Moxifloxacin Hcl In Nacl] Nausea And Vomiting   Nsaids     Other reaction(s): elevated kidney  test    HOME MEDICATIONS: Outpatient Medications Prior to Visit  Medication Sig Dispense Refill   Cholecalciferol (VITAMIN D3) 1000 UNITS CAPS Take by mouth daily.      Multiple Vitamins-Minerals (CENTRUM SILVER PO) Take by mouth daily.      rosuvastatin (CRESTOR) 5 MG tablet Take 1 tablet by mouth daily.     Amoxicill-Rifabutin-Omeprazole (TALICIA) 350-09.3-81 MG CPDR 4 capsules (Patient not taking: Reported on 01/12/2021)     Bismuth Subsalicylate 829 MG TABS 2 tablet with food today and again in 2 weeks. (Patient not taking:  Reported on 01/12/2021)     doxycycline (VIBRAMYCIN) 100 MG capsule 1 capsule (Patient not taking: Reported on 01/12/2021)     gabapentin (NEURONTIN) 100 MG capsule 1-3 capsules (Patient not taking: Reported on 01/12/2021)     hyoscyamine (ANASPAZ) 0.125 MG TBDP disintergrating tablet 1 tablet on the tongue and allow to dissolve  as needed (Patient not taking: Reported on 01/12/2021)     Leuprolide Acetate (ELIGARD) 7.5 MG injection See admin instructions. (Patient not taking: Reported on 01/12/2021)     metroNIDAZOLE (FLAGYL) 500 MG tablet 1 tablet (Patient not taking: Reported on 01/12/2021)     omeprazole (PRILOSEC) 20 MG capsule TAKE ONE CAPSULE BY MOUTH DAILY IN THE MORNING (Patient not taking: Reported on 01/12/2021)     pantoprazole (PROTONIX) 40 MG tablet Take 1 tablet by mouth daily. (Patient not taking: Reported on 01/12/2021)     rosuvastatin (CRESTOR) 5 MG tablet Take 5 mg by mouth.  (Patient not taking: Reported on 01/12/2021)     No facility-administered medications prior to visit.    PAST MEDICAL HISTORY: Past Medical History:  Diagnosis Date   Arthritis    MILD    COVID-19 virus infection    02-2020   History of kidney stones    HLD (hyperlipidemia)    Nephrolithiasis    per renal ultrasound 01-02-2019 in epic, left 41mm   Prostate cancer (  [facility]) urologist-- dr eskridge/  dr Tammi Klippel   dx 01-14-2019, Stage T1, Gleason 4+5   Wears glasses     PAST SURGICAL HISTORY: Past Surgical History:  Procedure Laterality Date   ABDOMINAL ADHESION SURGERY  1976   [place]   APPENDECTOMY  1972   COLONOSCOPY  01/06/2002   normal   COLONOSCOPY  01/06/2002   CYSTOSCOPY N/A 05/13/2019   Procedure: CYSTOSCOPY FLEXIBLE;  Surgeon: Festus Aloe, MD;  Location: [facility];  Service: Urology;  Laterality: N/A;  NO SEEDS FOUND IN BLADDER   RADIOACTIVE SEED IMPLANT N/A 05/13/2019   Procedure: RADIOACTIVE SEED IMPLANT/BRACHYTHERAPY IMPLANT;  Surgeon: Festus Aloe, MD;  Location:  [facility];  Service: Urology;  Laterality: N/A;   31  SEEDS IMPLANTED   SPACE OAR INSTILLATION N/A 05/13/2019   Procedure: SPACE OAR INSTILLATION;  Surgeon: Festus Aloe, MD;  Location: [facility];  Service: Urology;  Laterality: N/A;    FAMILY HISTORY: Family History  Problem Relation Age of Onset   Hyperlipidemia Brother    Colon cancer Neg Hx    Prostate cancer Neg Hx    Pancreatic cancer Neg Hx    Breast cancer Neg Hx    Colon polyps Neg Hx    Esophageal cancer Neg Hx     SOCIAL HISTORY: Social History   Socioeconomic History   Marital status: Married    Spouse name: Not on file   Number of children: 3   Years of education: Not on file   Highest education level: Not on file  Occupational History   Occupation: Insurance   Tobacco Use   Smoking status: Never   Smokeless tobacco: Never  Vaping Use   Vaping Use: Never used  Substance and Sexual Activity   Alcohol use: No   Drug use: Never   Sexual activity: Not on file  Other Topics Concern   Not on file  Social History Narrative   Designer, television/film set.  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   Vegetarian.   Social Determinants of Health   Financial Resource Strain: Not on file  Food Insecurity: Not on file  Transportation Needs: Not on file  Physical Activity: Not on file  Stress: Not on file  Social Connections: Not on file  Intimate Partner Violence: Not on file     PHYSICAL EXAM  GENERAL EXAM/CONSTITUTIONAL: Vitals:  Vitals:   01/12/21 1105  BP: 125/70  Pulse: 78  Weight: 153 lb (69.4 kg)  Height: 5\' 7"  (1.702 m)   Body mass index is 23.96 kg/m. Wt Readings from Last 3 Encounters:  01/12/21 153 lb (69.4 kg)  04/15/20 151 lb 14.4 oz (68.9 kg)  04/05/20 152 lb (68.9 kg)   Patient is in no distress; well developed, nourished and groomed; neck is supple  CARDIOVASCULAR: Examination of carotid arteries is normal; no carotid  bruits Regular rate and rhythm, no murmurs Examination of peripheral vascular system by observation and palpation is normal  EYES: Ophthalmoscopic exam of optic discs and posterior segments is normal; no papilledema or hemorrhages No results found.  MUSCULOSKELETAL: Gait, strength, tone, movements noted in Neurologic exam below  NEUROLOGIC: MENTAL STATUS:  No flowsheet data found. awake, alert, oriented to person, place and time recent and remote memory intact normal attention and concentration language fluent, comprehension intact, naming intact fund of knowledge appropriate  CRANIAL NERVE:  2nd - no papilledema on fundoscopic exam 2nd, 3rd, 4th, 6th - pupils equal and reactive to light,  visual fields full to confrontation, extraocular muscles intact, no nystagmus 5th - facial sensation symmetric 7th - facial strength symmetric 8th - hearing intact 9th - palate elevates symmetrically, uvula midline 11th - shoulder shrug symmetric 12th - tongue protrusion midline  MOTOR:  normal bulk and tone, full strength in the BUE, BLE  SENSORY:  normal and symmetric to light touch, temperature, vibration  COORDINATION:  finger-nose-finger, fine finger movements normal  REFLEXES:  deep tendon reflexes TRACE and symmetric  GAIT/STATION:  narrow based gait     DIAGNOSTIC DATA (LABS, IMAGING, TESTING) - I reviewed patient records, labs, notes, testing and imaging myself where available.  Lab Results  Component Value Date   WBC 5.4 04/15/2020   HGB 13.8 04/15/2020   HCT 41.4 04/15/2020   MCV 94.1 04/15/2020   PLT 234 04/15/2020      Component Value Date/Time   NA 139 04/15/2020 2144   K 3.8 04/15/2020 2144   CL 102 04/15/2020 2144   CO2 28 04/15/2020 2144   GLUCOSE 131 (H) 04/15/2020 2144   BUN 21 04/15/2020 2144   CREATININE 1.12 04/15/2020 2144   CALCIUM 9.0 04/15/2020 2144   PROT 6.5 04/15/2020 2144   ALBUMIN 3.4 (L) 04/15/2020 2144   AST 21 04/15/2020 2144    ALT 17 04/15/2020 2144   ALKPHOS 92 04/15/2020 2144   BILITOT 0.4 04/15/2020 2144   GFRNONAA >60 04/15/2020 2144   GFRAA >60 05/09/2019 1110   No results found for: CHOL, HDL, LDLCALC, LDLDIRECT, TRIG, CHOLHDL No results found for: HGBA1C No results found for: VITAMINB12 No results found for: TSH     ASSESSMENT AND PLAN  76 y.o. year old male here with:   Dx:  1. Numbness of right foot      PLAN:  MILD NUMBNESS IN RIGHT 2nd TOE (since 2022) - likely small local nerve irritation vs neuroma vs tendonitis; neuro exam normal; no evidence of underlying widespread neuropathy - consider topical creams (diclofenac, lidocaine) - consider podiatry evaluation  Return for return to PCP, pending if symptoms worsen or fail to improve.    Penni Bombard, MD 9/37/9024, 09:73 AM Certified in Neurology, Neurophysiology and Neuroimaging  [facility] [address], [place], [place] [address] [phone]

## 2021-01-13 DIAGNOSIS — A048 Other specified bacterial intestinal infections: Secondary | ICD-10-CM | POA: Diagnosis not present

## 2021-01-13 DIAGNOSIS — M79609 Pain in unspecified limb: Secondary | ICD-10-CM | POA: Diagnosis not present

## 2021-01-13 DIAGNOSIS — R109 Unspecified abdominal pain: Secondary | ICD-10-CM | POA: Diagnosis not present

## 2021-01-19 DIAGNOSIS — E7800 Pure hypercholesterolemia, unspecified: Secondary | ICD-10-CM | POA: Diagnosis not present

## 2021-01-19 DIAGNOSIS — Z79899 Other long term (current) drug therapy: Secondary | ICD-10-CM | POA: Diagnosis not present

## 2021-01-24 DIAGNOSIS — Z0000 Encounter for general adult medical examination without abnormal findings: Secondary | ICD-10-CM | POA: Diagnosis not present

## 2021-01-24 DIAGNOSIS — Z79899 Other long term (current) drug therapy: Secondary | ICD-10-CM | POA: Diagnosis not present

## 2021-01-24 DIAGNOSIS — E7800 Pure hypercholesterolemia, unspecified: Secondary | ICD-10-CM | POA: Diagnosis not present

## 2021-03-02 DIAGNOSIS — C61 Malignant neoplasm of prostate: Secondary | ICD-10-CM | POA: Diagnosis not present

## 2021-03-09 DIAGNOSIS — R350 Frequency of micturition: Secondary | ICD-10-CM | POA: Diagnosis not present

## 2021-03-09 DIAGNOSIS — E349 Endocrine disorder, unspecified: Secondary | ICD-10-CM | POA: Diagnosis not present

## 2021-03-09 DIAGNOSIS — R310 Gross hematuria: Secondary | ICD-10-CM | POA: Diagnosis not present

## 2021-03-09 DIAGNOSIS — R3912 Poor urinary stream: Secondary | ICD-10-CM | POA: Diagnosis not present

## 2021-03-09 DIAGNOSIS — N401 Enlarged prostate with lower urinary tract symptoms: Secondary | ICD-10-CM | POA: Diagnosis not present

## 2021-03-16 DIAGNOSIS — N3289 Other specified disorders of bladder: Secondary | ICD-10-CM | POA: Diagnosis not present

## 2021-03-16 DIAGNOSIS — R310 Gross hematuria: Secondary | ICD-10-CM | POA: Diagnosis not present

## 2021-03-16 DIAGNOSIS — N200 Calculus of kidney: Secondary | ICD-10-CM | POA: Diagnosis not present

## 2021-03-24 ENCOUNTER — Ambulatory Visit: Payer: Medicare Other | Admitting: Cardiology

## 2021-03-31 ENCOUNTER — Other Ambulatory Visit: Payer: Self-pay

## 2021-03-31 ENCOUNTER — Emergency Department (HOSPITAL_BASED_OUTPATIENT_CLINIC_OR_DEPARTMENT_OTHER): Payer: Medicare Other

## 2021-03-31 ENCOUNTER — Emergency Department (HOSPITAL_BASED_OUTPATIENT_CLINIC_OR_DEPARTMENT_OTHER)
Admission: EM | Admit: 2021-03-31 | Discharge: 2021-03-31 | Disposition: A | Discharge status: Home / Self Care | Payer: Medicare Other | Attending: Emergency Medicine | Admitting: Emergency Medicine

## 2021-03-31 ENCOUNTER — Encounter (HOSPITAL_BASED_OUTPATIENT_CLINIC_OR_DEPARTMENT_OTHER): Payer: Self-pay

## 2021-03-31 DIAGNOSIS — K56609 Unspecified intestinal obstruction, unspecified as to partial versus complete obstruction: Secondary | ICD-10-CM

## 2021-03-31 DIAGNOSIS — R1030 Lower abdominal pain, unspecified: Secondary | ICD-10-CM | POA: Diagnosis present

## 2021-03-31 DIAGNOSIS — K56699 Other intestinal obstruction unspecified as to partial versus complete obstruction: Secondary | ICD-10-CM | POA: Insufficient documentation

## 2021-03-31 LAB — COMPREHENSIVE METABOLIC PANEL
ALT: 15 U/L (ref 0–44)
AST: 27 U/L (ref 15–41)
Albumin: 4 g/dL (ref 3.5–5.0)
Alkaline Phosphatase: 100 U/L (ref 38–126)
Anion gap: 9 (ref 5–15)
BUN: 23 mg/dL (ref 8–23)
CO2: 26 mmol/L (ref 22–32)
Calcium: 9.4 mg/dL (ref 8.9–10.3)
Chloride: 102 mmol/L (ref 98–111)
Creatinine, Ser: 1.17 mg/dL (ref 0.61–1.24)
GFR, Estimated: >60 mL/min (ref >60)
Glucose, Bld: 103 mg/dL — ABNORMAL HIGH (ref 70–99)
Potassium: 4.2 mmol/L (ref 3.5–5.1)
Sodium: 137 mmol/L (ref 135–145)
Total Bilirubin: 1.2 mg/dL (ref 0.3–1.2)
Total Protein: 7.7 g/dL (ref 6.5–8.1)

## 2021-03-31 LAB — URINALYSIS, ROUTINE W REFLEX MICROSCOPIC
Bilirubin Urine: NEGATIVE
Glucose, UA: NEGATIVE mg/dL
Hgb urine dipstick: NEGATIVE
Ketones, ur: NEGATIVE mg/dL
Leukocytes,Ua: NEGATIVE
Nitrite: NEGATIVE
Protein, ur: NEGATIVE mg/dL
Specific Gravity, Urine: >=1.03 (ref 1.005–1.030)
pH: 5.5 (ref 5.0–8.0)

## 2021-03-31 LAB — CBC WITH DIFFERENTIAL/PLATELET
Abs Immature Granulocytes: 0.02 10*3/uL (ref 0.00–0.07)
Basophils Absolute: 0 10*3/uL (ref 0.0–0.1)
Basophils Relative: 1 %
Eosinophils Absolute: 0.3 10*3/uL (ref 0.0–0.5)
Eosinophils Relative: 4 %
HCT: 46.8 % (ref 39.0–52.0)
Hemoglobin: 15.5 g/dL (ref 13.0–17.0)
Immature Granulocytes: 0 %
Lymphocytes Relative: 28 %
Lymphs Abs: 2 10*3/uL (ref 0.7–4.0)
MCH: 30.6 pg (ref 26.0–34.0)
MCHC: 33.1 g/dL (ref 30.0–36.0)
MCV: 92.5 fL (ref 80.0–100.0)
Monocytes Absolute: 0.6 10*3/uL (ref 0.1–1.0)
Monocytes Relative: 8 %
Neutro Abs: 4.2 10*3/uL (ref 1.7–7.7)
Neutrophils Relative %: 59 %
Platelets: 233 10*3/uL (ref 150–400)
RBC: 5.06 MIL/uL (ref 4.22–5.81)
RDW: 13.5 % (ref 11.5–15.5)
WBC: 7.2 10*3/uL (ref 4.0–10.5)
nRBC: 0 % (ref 0.0–0.2)

## 2021-03-31 LAB — LIPASE, BLOOD: Lipase: 32 U/L (ref 11–51)

## 2021-03-31 MED ORDER — IOHEXOL 300 MG/ML  SOLN
100.0000 mL | Freq: Once | INTRAMUSCULAR | Status: AC | PRN
  Administered 2021-03-31: 100 mL via INTRAVENOUS

## 2021-03-31 MED ORDER — ONDANSETRON 4 MG PO TBDP: 4.0000 mg | ORAL_TABLET | Freq: Three times a day (TID) | ORAL | 0 refills | Status: DC | PRN

## 2021-03-31 NOTE — ED Notes (Signed)
Warm blanket given, pt sts that he understands pending d/c and provider spoke to him about results. Denies other needs. ?

## 2021-03-31 NOTE — ED Provider Notes (Signed)
?[facility] DEPART[facility] ?Provider Note ? ? ?CSN: [id number] ?Arrival date & time: 03/31/21  1014 ? ?  ? ?History ? ?Chief Complaint  ?Patient presents with  ? Abdominal Pain  ? ? ?Shawn Walsh is a 76 y.o. male with a history of prostate surgery status post radiation treatment, presenting to emergency department with abdominal pain.  Patient reports he began having sharp lower abdominal pain yesterday evening, approximately 3 hours after finishing dinner.  He felt nauseated overnight.  The pain eased up a bit so he was able to sleep but returned this morning.  He feels similar to pain he had a year ago, for which she was seen in the emergency department in April, and a CT of the abdomen showing bladder wall thickening, was treated for cystitis.  He was also noted to have a 7 mm stone in his kidney at the time, but no passing stone. ? ?He was diagnosed with H. pylori in the past year, treated with a full course of antibiotics, takes antacid medicine.  He feels that this pain is different than his typical reflux pain. ? ?He has a history of an appendectomy and lysis of adhesions for bowel obstruction. ? ?HPI ? ?  ? ?Home Medications ?Prior to Admission medications   ?Medication Sig Start Date End Date Taking? Authorizing Provider  ?ondansetron (ZOFRAN-ODT) 4 MG disintegrating tablet Take 1 tablet (4 mg total) by mouth every 8 (eight) hours as needed for up to 15 doses for nausea or vomiting. 03/31/21  Yes Jhamir Pickup, Carola Rhine, MD  ?Cholecalciferol (VITAMIN D3) 1000 UNITS CAPS Take by mouth daily.     [provider]  ?Multiple Vitamins-Minerals (CENTRUM SILVER PO) Take by mouth daily.     [provider]  ?rosuvastatin (CRESTOR) 5 MG tablet Take 1 tablet by mouth daily.    [provider]  ?   ? ?Allergies    ?Amoxicillin, Amoxicillin-pot clavulanate, Avelox [moxifloxacin hcl in nacl], and Nsaids   ? ?Review of Systems   ?Review of Systems ? ?Physical Exam ?Updated Vital  Signs ?BP (!) 145/86   Pulse 72   Temp 97.9 ?F (36.6 ?C) (Oral)   Resp 18   Ht 5' 7.5" (1.715 m)   Wt 65.8 kg   SpO2 100%   BMI 22.38 kg/m?  ?Physical Exam ?Constitutional:   ?   General: He is not in acute distress. ?HENT:  ?   Head: Normocephalic and atraumatic.  ?Eyes:  ?   Conjunctiva/sclera: Conjunctivae normal.  ?   Pupils: Pupils are equal, round, and reactive to light.  ?Cardiovascular:  ?   Rate and Rhythm: Normal rate and regular rhythm.  ?Pulmonary:  ?   Effort: Pulmonary effort is normal. No respiratory distress.  ?Abdominal:  ?   General: There is no distension.  ?   Tenderness: There is abdominal tenderness in the suprapubic area. There is no guarding. Negative signs include Murphy's sign.  ?Skin: ?   General: Skin is warm and dry.  ?Neurological:  ?   General: No focal deficit present.  ?   Mental Status: He is alert. Mental status is at baseline.  ?Psychiatric:     ?   Mood and Affect: Mood normal.     ?   Behavior: Behavior normal.  ? ? ?ED Results / Procedures / Treatments   ?Labs ?(all labs ordered are listed, but only abnormal results are displayed) ?Labs Reviewed  ?COMPREHENSIVE METABOLIC PANEL - Abnormal; Notable for the following  components:  ?    Result Value  ? Glucose, Bld 103 (*)   ? All other components within normal limits  ?CBC WITH DIFFERENTIAL/PLATELET  ?LIPASE, BLOOD  ?URINALYSIS, ROUTINE W REFLEX MICROSCOPIC  ? ? ?EKG ?None ? ?Radiology ?CT ABDOMEN PELVIS W CONTRAST ? ?Result Date: 03/31/2021 ?CLINICAL DATA:  Flank pain. Kidney stones suspected. Nausea and vomiting. History of renal stone. Sharp lower abdominal pain. Nausea. Differential diagnosis includes colitis versus ureteral colic versus biliary disease. History of appendectomy. Abdominal pain since last night. Vomited once last night. EXAM: CT ABDOMEN AND PELVIS WITH CONTRAST TECHNIQUE: Multidetector CT imaging of the abdomen and pelvis was performed using the standard protocol following bolus administration of  intravenous contrast. RADIATION DOSE REDUCTION: This exam was performed according to the departmental dose-optimization program which includes automated exposure control, adjustment of the mA and/or kV according to patient size and/or use of iterative reconstruction technique. CONTRAST:  128m OMNIPAQUE IOHEXOL 300 MG/ML  SOLN COMPARISON:  CT abdomen and pelvis without and with contrast 03/16/2021, CT abdomen and pelvis with contrast 04/15/2020 04/15/2020 FINDINGS: Lower chest: Lung bases are unremarkable. Hepatobiliary: Smooth liver contours. Mildly decreased attenuation throughout the liver suggesting fatty infiltration. wNo focal liver mass is identified. The gallbladder is unremarkable. No intrahepatic or extrahepatic biliary ductal dilatation. Pancreas: No mass or inflammatory fat stranding. No pancreatic ductal dilatation is seen. Spleen: Normal in size without focal abnormality. Adrenals/Urinary Tract: Adrenal glands are unremarkable. The kidneys enhance uniformly and are symmetric in size without hydronephrosis. No significant change in a 7 mm stone within the interpolar region of the left kidney. No renal mass is seen. The bladder is only mildly distended, similar to multiple prior CTs. No urinary bladder calculi are seen. There is likely chronic mild urinary bladder wall thickening diffusely with minimal adjacent peripheral fat stranding. This is unchanged from prior. Stomach/Bowel: No focal bowel wall thickening. Mild sigmoid diverticulosis. Moderate stool is seen throughout the colon. The terminal ileum is unremarkable. The appendix is surgically absent. There are mildly prominent loops of small bowel within the left lower quadrant with internal fluid measuring up to 2.8 cm in caliber. There is a transition point from bowel measuring 2.4 cm in caliber (axial series 2, image 62) to decompressed bowel measuring 1.3 cm in caliber (axial image 54) that may represent an area of partial small-bowel obstruction  (coronal image 51). The narrowing of the bowel distal to this transition point is very similar to 03/16/2021 CT and 04/15/2020 CT, and there appears to be a bowel stricture in this region. The rest of the more distal ileum remains decompressed and cannot exclude additional areas of possible chronic narrowing/stricture. Vascular/Lymphatic: No abdominal aortic aneurysm. Minimal atherosclerotic calcifications. No mesenteric, retroperitoneal, or pelvic lymphadenopathy. Reproductive: Brachytherapy seeds are again seen within the prostate. The seminal vesicles are grossly unremarkable. Other: No abdominal wall hernia or abnormality.No abdominopelvic ascites. No pneumoperitoneum. 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 Vertebral body heights are maintained. Moderate posterior L5-S1 and posterior L4-5 disc space narrowing. Moderate anterior L2-3 endplate osteophytes. IMPRESSION:: IMPRESSION: 1. There is new mild distention of multiple loops of small bowel within left hemiabdomen. There is an apparent transition point to decompressed small bowel (mid ileum) within the right lower quadrant/superior pelvis (coronal image 51). There actually appears to be longstanding narrowing of the ileum in a similar region on the prior 03/16/2021 and 04/15/2020 CTs however no upstream bowel dilatation was seen previously.  Findings suggest a partial small bowel obstruction with likely scarring within the ileum distal to the transition point. 2. Left midpole 7 mm nonobstructing renal stone. No hydronephrosis. Unchanged chronic diffuse bladder wall thickening with mild surrounding inflammatory fat stranding. This may represent infectious cystitis or be chronic/radiation induced, given this is unchanged from multiple prior CTs. Aortic Atherosclerosis (ICD10-I70.0). Electronically Signed   By: Yvonne Kendall M.D.   On: 03/31/2021 12:07    ? ?Procedures ?Procedures  ? ? ?Medications Ordered in ED ?Medications  ?iohexol (OMNIPAQUE) 300 MG/ML solution 100 mL (100 mLs Intravenous Contrast Given 03/31/21 1130)  ? ? ?ED Course/ Medical Decision Making/ A&P ?Clinical Course as of 03/30

## 2021-03-31 NOTE — ED Triage Notes (Signed)
C/o "severe abdominal pain" since last night with emesis. Generalized abdominal pain.  ?

## 2021-03-31 NOTE — Discharge Instructions (Addendum)
You have a partial bowel obstruction on CT scan.  This is likely the cause of your abdominal pain and bloating and nausea.  For the next 2 days and wanted to stick to a liquid diet, drinking predominantly small, continue with sips of water, you can also have soup broth, Jell-O, or pudding, or applesauce.  Avoid solid foods, cheese, milk, and ice cream which can lead to bloating. ? ?You can use a suppository to help stimulate a bowel movement at home.  After 2 days you can ease back into your regular diet, especially if you are having regular bowel movements and no further nausea. ? ?If you are no longer passing gas, feel increasingly bloated, nauseated, having worsening abdominal pain, or cannot keep down any water, come back to the emergency department.  You may require hospitalization at that point. ? ?I prescribed a medicine called Zofran to help with nausea at home.  I sent this to your pharmacy. ?

## 2021-03-31 NOTE — ED Notes (Signed)
Patient transported to CT 

## 2021-04-18 DIAGNOSIS — R948 Abnormal results of function studies of other organs and systems: Secondary | ICD-10-CM | POA: Diagnosis not present

## 2021-04-18 DIAGNOSIS — E349 Endocrine disorder, unspecified: Secondary | ICD-10-CM | POA: Diagnosis not present

## 2021-05-21 DIAGNOSIS — Z23 Encounter for immunization: Secondary | ICD-10-CM | POA: Diagnosis not present

## 2021-06-13 DIAGNOSIS — B349 Viral infection, unspecified: Secondary | ICD-10-CM | POA: Diagnosis not present

## 2021-06-16 DIAGNOSIS — Z03818 Encounter for observation for suspected exposure to other biological agents ruled out: Secondary | ICD-10-CM | POA: Diagnosis not present

## 2021-06-16 DIAGNOSIS — J069 Acute upper respiratory infection, unspecified: Secondary | ICD-10-CM | POA: Diagnosis not present

## 2021-06-16 DIAGNOSIS — R059 Cough, unspecified: Secondary | ICD-10-CM | POA: Diagnosis not present

## 2021-06-16 DIAGNOSIS — R6883 Chills (without fever): Secondary | ICD-10-CM | POA: Diagnosis not present

## 2021-06-20 DIAGNOSIS — H5789 Other specified disorders of eye and adnexa: Secondary | ICD-10-CM | POA: Diagnosis not present

## 2021-06-20 DIAGNOSIS — R0981 Nasal congestion: Secondary | ICD-10-CM | POA: Diagnosis not present

## 2021-06-21 DIAGNOSIS — K520 Gastroenteritis and colitis due to radiation: Secondary | ICD-10-CM | POA: Diagnosis not present

## 2021-08-03 DIAGNOSIS — R0981 Nasal congestion: Secondary | ICD-10-CM | POA: Diagnosis not present

## 2021-08-03 DIAGNOSIS — R5383 Other fatigue: Secondary | ICD-10-CM | POA: Diagnosis not present

## 2021-08-03 DIAGNOSIS — R6883 Chills (without fever): Secondary | ICD-10-CM | POA: Diagnosis not present

## 2021-08-03 DIAGNOSIS — U071 COVID-19: Secondary | ICD-10-CM | POA: Diagnosis not present

## 2021-08-03 DIAGNOSIS — R059 Cough, unspecified: Secondary | ICD-10-CM | POA: Diagnosis not present

## 2021-10-13 DIAGNOSIS — Z8546 Personal history of malignant neoplasm of prostate: Secondary | ICD-10-CM | POA: Diagnosis not present

## 2021-10-13 DIAGNOSIS — R350 Frequency of micturition: Secondary | ICD-10-CM | POA: Diagnosis not present

## 2021-10-13 DIAGNOSIS — E349 Endocrine disorder, unspecified: Secondary | ICD-10-CM | POA: Diagnosis not present

## 2021-10-13 DIAGNOSIS — R310 Gross hematuria: Secondary | ICD-10-CM | POA: Diagnosis not present

## 2021-10-20 DIAGNOSIS — Z23 Encounter for immunization: Secondary | ICD-10-CM | POA: Diagnosis not present

## 2021-11-02 DIAGNOSIS — I951 Orthostatic hypotension: Secondary | ICD-10-CM | POA: Diagnosis not present

## 2021-11-02 DIAGNOSIS — Z6823 Body mass index (BMI) 23.0-23.9, adult: Secondary | ICD-10-CM | POA: Diagnosis not present

## 2021-12-20 DIAGNOSIS — R059 Cough, unspecified: Secondary | ICD-10-CM | POA: Diagnosis not present

## 2021-12-20 DIAGNOSIS — R051 Acute cough: Secondary | ICD-10-CM | POA: Diagnosis not present

## 2021-12-20 DIAGNOSIS — Z03818 Encounter for observation for suspected exposure to other biological agents ruled out: Secondary | ICD-10-CM | POA: Diagnosis not present

## 2021-12-20 DIAGNOSIS — Z6823 Body mass index (BMI) 23.0-23.9, adult: Secondary | ICD-10-CM | POA: Diagnosis not present

## 2022-01-20 DIAGNOSIS — Z79899 Other long term (current) drug therapy: Secondary | ICD-10-CM | POA: Diagnosis not present

## 2022-01-20 DIAGNOSIS — E7800 Pure hypercholesterolemia, unspecified: Secondary | ICD-10-CM | POA: Diagnosis not present

## 2022-01-27 DIAGNOSIS — Z0000 Encounter for general adult medical examination without abnormal findings: Secondary | ICD-10-CM | POA: Diagnosis not present

## 2022-01-27 DIAGNOSIS — R350 Frequency of micturition: Secondary | ICD-10-CM | POA: Diagnosis not present

## 2022-01-27 DIAGNOSIS — Z79899 Other long term (current) drug therapy: Secondary | ICD-10-CM | POA: Diagnosis not present

## 2022-01-27 DIAGNOSIS — L989 Disorder of the skin and subcutaneous tissue, unspecified: Secondary | ICD-10-CM | POA: Diagnosis not present

## 2022-01-27 DIAGNOSIS — E7800 Pure hypercholesterolemia, unspecified: Secondary | ICD-10-CM | POA: Diagnosis not present

## 2022-01-27 DIAGNOSIS — Z6823 Body mass index (BMI) 23.0-23.9, adult: Secondary | ICD-10-CM | POA: Diagnosis not present

## 2022-02-14 ENCOUNTER — Emergency Department (HOSPITAL_BASED_OUTPATIENT_CLINIC_OR_DEPARTMENT_OTHER): Payer: Medicare Other

## 2022-02-14 ENCOUNTER — Emergency Department (HOSPITAL_BASED_OUTPATIENT_CLINIC_OR_DEPARTMENT_OTHER)
Admission: EM | Admit: 2022-02-14 | Discharge: 2022-02-14 | Disposition: A | Discharge status: Home / Self Care | Payer: Medicare Other | Attending: Emergency Medicine | Admitting: Emergency Medicine

## 2022-02-14 ENCOUNTER — Other Ambulatory Visit (HOSPITAL_BASED_OUTPATIENT_CLINIC_OR_DEPARTMENT_OTHER): Payer: Self-pay

## 2022-02-14 ENCOUNTER — Other Ambulatory Visit: Payer: Self-pay

## 2022-02-14 ENCOUNTER — Encounter (HOSPITAL_BASED_OUTPATIENT_CLINIC_OR_DEPARTMENT_OTHER): Payer: Self-pay | Admitting: Emergency Medicine

## 2022-02-14 DIAGNOSIS — R109 Unspecified abdominal pain: Secondary | ICD-10-CM | POA: Diagnosis not present

## 2022-02-14 DIAGNOSIS — K5900 Constipation, unspecified: Secondary | ICD-10-CM | POA: Insufficient documentation

## 2022-02-14 LAB — URINALYSIS, ROUTINE W REFLEX MICROSCOPIC
Bilirubin Urine: NEGATIVE
Glucose, UA: NEGATIVE mg/dL
Ketones, ur: NEGATIVE mg/dL
Leukocytes,Ua: NEGATIVE
Nitrite: NEGATIVE
Protein, ur: 100 mg/dL — AB
Specific Gravity, Urine: 1.025 (ref 1.005–1.030)
pH: 7 (ref 5.0–8.0)

## 2022-02-14 LAB — CBC
HCT: 43.4 % (ref 39.0–52.0)
Hemoglobin: 14.2 g/dL (ref 13.0–17.0)
MCH: 29.8 pg (ref 26.0–34.0)
MCHC: 32.7 g/dL (ref 30.0–36.0)
MCV: 91.2 fL (ref 80.0–100.0)
Platelets: 207 10*3/uL (ref 150–400)
RBC: 4.76 MIL/uL (ref 4.22–5.81)
RDW: 13.7 % (ref 11.5–15.5)
WBC: 8.2 10*3/uL (ref 4.0–10.5)
nRBC: 0 % (ref 0.0–0.2)

## 2022-02-14 LAB — COMPREHENSIVE METABOLIC PANEL
ALT: 15 U/L (ref 0–44)
AST: 24 U/L (ref 15–41)
Albumin: 4.1 g/dL (ref 3.5–5.0)
Alkaline Phosphatase: 94 U/L (ref 38–126)
Anion gap: 11 (ref 5–15)
BUN: 19 mg/dL (ref 8–23)
CO2: 28 mmol/L (ref 22–32)
Calcium: 9.5 mg/dL (ref 8.9–10.3)
Chloride: 97 mmol/L — ABNORMAL LOW (ref 98–111)
Creatinine, Ser: 1.07 mg/dL (ref 0.61–1.24)
GFR, Estimated: >60 mL/min (ref >60)
Glucose, Bld: 160 mg/dL — ABNORMAL HIGH (ref 70–99)
Potassium: 3.7 mmol/L (ref 3.5–5.1)
Sodium: 136 mmol/L (ref 135–145)
Total Bilirubin: 1.1 mg/dL (ref 0.3–1.2)
Total Protein: 8.1 g/dL (ref 6.5–8.1)

## 2022-02-14 LAB — URINALYSIS, MICROSCOPIC (REFLEX): RBC / HPF: >50 RBC/hpf (ref 0–5)

## 2022-02-14 LAB — LIPASE, BLOOD: Lipase: 27 U/L (ref 11–51)

## 2022-02-14 MED ORDER — POLYETHYLENE GLYCOL 3350 17 GM/SCOOP PO POWD
1.0000 | Freq: Once | ORAL | 0 refills | Status: AC
  Filled 2022-02-14: qty 238, 1d supply, fill #0

## 2022-02-14 MED ORDER — IOHEXOL 300 MG/ML  SOLN
100.0000 mL | Freq: Once | INTRAMUSCULAR | Status: AC | PRN
  Administered 2022-02-14: 100 mL via INTRAVENOUS

## 2022-02-14 MED ORDER — ONDANSETRON 4 MG PO TBDP: 4.0000 mg | ORAL_TABLET | Freq: Once | ORAL | Status: DC

## 2022-02-14 NOTE — Discharge Instructions (Addendum)
You came to the emergency department with abdominal pain.  You have a potentially developing obstruction.  We believe that this can be resolved with some diuretics.  Please mix the whole bottle of MiraLAX with 2 L of Gatorade, Powerade or any other electrolyte drinks.  You should drink a cup of this every hour to help relieve your constipation.  This functions like the drink prior to colonoscopy.  I would like you to follow-up with Dr. Alessandra Bevels as soon as possible.  Please call her for an appointment.  You also have a kidney stone in your left ureter.  You can discuss this with your urologist and see if there is anything further that they would like to do about it.

## 2022-02-14 NOTE — ED Triage Notes (Signed)
Pt here for abd pain that started last night at 5pm. Pt reports hx of similar pain in the last 6 months, but states this time it is worse. Pt is a vegetarian and reports he had a veggie sub 2 days ago and believes this caused his pain. Hx of prostate CA and h pylori. Pt has had vomiting and constipation, reports small BM today. Pt denies fevers/chills.

## 2022-02-14 NOTE — ED Provider Notes (Signed)
[facility] Provider Note   CSN: [id number] Arrival date & time: 02/14/22  [id number]     History  Chief Complaint  Patient presents with   Abdominal Pain    Shawn Walsh is a 77 y.o. male presenting with waxing and waning abdominal pain since last night at 5 PM.  When it started it was very severe and in the middle of his abdomen.  Did not radiate.  He started to have a few episodes of emesis.  The pain somewhat subsided however it came back this morning more severe and he had further episodes of emesis.  He reports that he has been having regular bowel movements up until last night when he felt like he needed to have a bowel movement but was unable.  Was able to get a small amount of stool out in the waiting room today.  Also had multiple episodes of emesis in the waiting room and reports feeling somewhat better.  He is currently under treatment for prostate cancer.   Reports previous appendectomy and some "scarring" in the abdomen.  Believes that he has had a bowel obstruction in the past.  Follows with Dr. Alessandra Bevels with [facility]   Abdominal Pain      Home Medications Prior to Admission medications   Medication Sig Start Date End Date Taking? Authorizing Provider  Cholecalciferol (VITAMIN D3) 1000 UNITS CAPS Take by mouth daily.     [provider]  Multiple Vitamins-Minerals (CENTRUM SILVER PO) Take by mouth daily.     [provider]  ondansetron (ZOFRAN-ODT) 4 MG disintegrating tablet Take 1 tablet (4 mg total) by mouth every 8 (eight) hours as needed for up to 15 doses for nausea or vomiting. 03/31/21   Wyvonnia Dusky, MD  rosuvastatin (CRESTOR) 5 MG tablet Take 1 tablet by mouth daily.    [provider]      Allergies    Amoxicillin, Amoxicillin-pot clavulanate, Avelox [moxifloxacin hcl in nacl], and Nsaids    Review of Systems   Review of Systems  Gastrointestinal:  Positive for abdominal pain.     Physical Exam Updated Vital Signs BP 139/83 (BP Location: Right Arm)   Pulse 84   Temp 97.8 F (36.6 C) (Oral)   Resp 20   Ht 5' 7.5" (1.715 m)   Wt 68.5 kg   SpO2 99%   BMI 23.30 kg/m  Physical Exam Vitals and nursing note reviewed.  Constitutional:      General: He is not in acute distress.    Appearance: Normal appearance. He is not ill-appearing.  HENT:     Head: Normocephalic and atraumatic.  Eyes:     General: No scleral icterus.    Conjunctiva/sclera: Conjunctivae normal.  Pulmonary:     Effort: Pulmonary effort is normal. No respiratory distress.  Abdominal:     General: There is distension.     Palpations: Abdomen is soft.     Tenderness: There is abdominal tenderness in the periumbilical area.     Hernia: No hernia is present.  Skin:    Findings: No rash.  Neurological:     Mental Status: He is alert.  Psychiatric:        Mood and Affect: Mood normal.     ED Results / Procedures / Treatments   Labs (all labs ordered are listed, but only abnormal results are displayed) Labs Reviewed  COMPREHENSIVE METABOLIC PANEL - Abnormal; Notable for the following components:  Result Value   Chloride 97 (*)    Glucose, Bld 160 (*)    All other components within normal limits  URINALYSIS, ROUTINE W REFLEX MICROSCOPIC - Abnormal; Notable for the following components:   APPearance CLOUDY (*)    Hgb urine dipstick LARGE (*)    Protein, ur 100 (*)    All other components within normal limits  URINALYSIS, MICROSCOPIC (REFLEX) - Abnormal; Notable for the following components:   Bacteria, UA FEW (*)    All other components within normal limits  LIPASE, BLOOD  CBC    EKG None  Radiology CT ABDOMEN PELVIS W CONTRAST  Result Date: 02/14/2022 CLINICAL DATA:  Abdominal pain that started last night about 5 p.m. Eastern standard time. EXAM: CT ABDOMEN AND PELVIS WITH CONTRAST TECHNIQUE: Multidetector CT imaging of the abdomen and pelvis was performed using the  standard protocol following bolus administration of intravenous contrast. RADIATION DOSE REDUCTION: This exam was performed according to the departmental dose-optimization program which includes automated exposure control, adjustment of the mA and/or kV according to patient size and/or use of iterative reconstruction technique. CONTRAST:  184m OMNIPAQUE IOHEXOL 300 MG/ML  SOLN COMPARISON:  CT 03/31/2021 and older FINDINGS: Lower chest: Mild linear opacity lung bases likely scar or atelectasis. No pleural effusion. Hepatobiliary: Fatty liver infiltration. No space-occupying liver lesion. Patent portal vein. Gallbladder is nondilated. Pancreas: Mild pancreatic atrophy without enhancing mass or ductal dilatation. Spleen: Normal in size without focal abnormality. Adrenals/Urinary Tract: Adrenal glands are preserved. Focal atrophy along the midportion of the left kidney anteriorly with some low-density in this location and less enhancement in the other portions of the left kidney. This is somewhat geographic in appearance. Minimal stranding but not more so than other areas. The stone previously seen measuring 7 mm in this location on the anterior left kidney is now in the area of the UPJ, proximal ureter but no collecting system dilatation. Please see series 5, image 46, coronal imaging. The more distal ureters bilaterally have a normal course and caliber down to the bladder. Preserved contours of the urinary bladder. Stomach/Bowel: On this non oral contrast exam, the large bowel has a normal course and caliber. Colon is with fluid in stool. The appendix is not clearly seen in the right lower quadrant but no pericecal stranding or fluid. Stomach is distended with fluid mildly. Also luminal air. The jejunum is nondilated. The ileum is fluid-filled and distended proximally with some decompressed loops more distally proximal ileum has areas of wall thickening and some enhancement. Relatively slow transition from the mildly  dilated loops measuring up to 3.1 cm to the more decompressed loops. No bowel wall pneumatosis. No portal venous gas. Vascular/Lymphatic: Normal caliber aorta and IVC with minimal atherosclerotic plaque. Reproductive: Brachytherapy changes in the prostate. Other: Trace simple free fluid in the dependent pelvis. No free air. Small fat containing umbilical hernia. Musculoskeletal: Pars defects seen at L5 with trace listhesis. There is some disc bulging along the lower lumbar spine with stenosis particularly at L4-5 and L5-S1. Mild degenerative changes along the pelvis. IMPRESSION: Mildly dilated loops of small bowel, proximal ileum which are fluid-filled with some wall thickening. There is a slow transition in the central pelvis. A very low-grade obstruction would be in the differential. Please correlate for any infectious or inflammatory process as well. No pneumatosis, free air. Only trace simple free fluid. There is slightly lower enhancement noted in the anterior aspect of the left mid kidney where there is atrophy. The previous stone  in the left kidney is now noted to be into the proximal left ureter, UPJ region measuring 7 mm. No proximal collecting system dilatation. Fatty liver infiltration. Electronically Signed   By: Jill Side M.D.   On: 02/14/2022 12:39    Procedures Procedures   Medications Ordered in ED Medications  ondansetron (ZOFRAN-ODT) disintegrating tablet 4 mg (4 mg Oral Not Given 02/14/22 1200)  iohexol (OMNIPAQUE) 300 MG/ML solution 100 mL (100 mLs Intravenous Contrast Given 02/14/22 1213)    ED Course/ Medical Decision Making/ A&P                             Medical Decision Making Amount and/or Complexity of Data Reviewed Labs: ordered. Radiology: ordered.  Risk Prescription drug management.   Differential includes but is not limited to AAA, gastroenteritis, appendicitis, Bowel obstruction, Bowel perforation. Gastroparesis, DKA, Hernia, Inflammatory bowel disease,  mesenteric ischemia, pancreatitis, peritonitis SBP, volvulus.    This is not an exhaustive differential.    Past Medical History / Co-morbidities / Social History: Appendectomy, current prostate cancer under treatment with urology, poor diet   Additional history: Unable to see patient's results with the etiology however I am able to see his visit previously with [place].   Physical Exam: Pertinent physical exam findings include Distended mildly tender abdomen  Lab Tests: I ordered, and personally interpreted labs.  The pertinent results include: Hematuria   Imaging Studies: I ordered and independently visualized and interpreted CT AP and I agree with the radiologist that   Patient's kidney stone is now more distal than his previous scan.  Also has some small bowel dilatation and potential early obstruction    Medications: Currently reports that the pain and nausea have subsided  Consultations Obtained: I spoke with Claiborne Billings, [place] with general surgery and she agrees that there is likely no intervention necessary at this time.  Recommends MiraLAX cleanout and GI follow-up.  MDM/Disposition: This is a 77 year old male who presented today with abdominal pain, constipation and intermittent nausea vomiting.  Has a history of this as well as constipation secondary to poor diet.  He follows with Dr. Alessandra Bevels with GI.  CAT scan showed potential early obstruction.  Patient is still passing gas, having small bowel movements and currently tolerating p.o.  Spoke with surgery who does not believe any intervention is necessary.  Will proceed with MiraLAX cleanout and follow-up with [facility].  Additionally, he had a left-sided UPJ stone.  The stone is larger than his previous CT scan and is also more distal.  He already follows with urology and can see them about this.  I do not believe this is contributing to his pain at this time.  Likely the source of his hematuria.  He has good follow-up and will  call his urologist for an appointment today.  Also will call for gastroenterology.  He is stable, tolerating p.o. and agreeable to discharge at this time.   Final Clinical Impression(s) / ED Diagnoses Final diagnoses:  Constipation, unspecified constipation type    Rx / DC Orders ED Discharge Orders          Ordered    polyethylene glycol powder (GLYCOLAX/MIRALAX) 17 GM/SCOOP powder   Once        02/14/22 1422           Results and diagnoses were explained to the patient. Return precautions discussed in full. Patient had no additional questions and expressed complete understanding.  This chart was dictated using voice recognition software.  Despite best efforts to proofread,  errors can occur which can change the documentation meaning.    Rhae Hammock, PA-C 02/14/22 1507    Gareth Morgan, MD 02/14/22 2324

## 2022-02-14 NOTE — ED Notes (Signed)
Light green and Lavender drawn sent to lab

## 2022-02-14 NOTE — ED Notes (Signed)
Client with complaints of abd since last PM, had some episodes of N/V, again this am still having abd pain, appetite is poor, feels tired, no nausea at this time, no vomiting. Wife at bedside. Appears comfortable at this time, talking with nursing staff.

## 2022-03-01 DIAGNOSIS — Z8546 Personal history of malignant neoplasm of prostate: Secondary | ICD-10-CM | POA: Diagnosis not present

## 2022-03-08 DIAGNOSIS — N201 Calculus of ureter: Secondary | ICD-10-CM | POA: Diagnosis not present

## 2022-03-08 DIAGNOSIS — Z8546 Personal history of malignant neoplasm of prostate: Secondary | ICD-10-CM | POA: Diagnosis not present

## 2022-03-08 DIAGNOSIS — E349 Endocrine disorder, unspecified: Secondary | ICD-10-CM | POA: Diagnosis not present

## 2022-03-09 ENCOUNTER — Other Ambulatory Visit: Payer: Self-pay | Admitting: Urology

## 2022-03-09 ENCOUNTER — Encounter (HOSPITAL_BASED_OUTPATIENT_CLINIC_OR_DEPARTMENT_OTHER): Payer: Self-pay | Admitting: Urology

## 2022-03-09 NOTE — Progress Notes (Signed)
Spoke with pt regarding upcoming ESWL 03/13/22. Instructed to arrive 0915, NPO after midnight. Reviewed allergies, history, and medications. Pt aware to hold multivitamins/supplements. Pt avoids NSAIDs and does not take blood thinners. Reviewed pre procedure instructions. Pt to pick up blue folder and will bring day of. Spouse to be driver. Pt verbalized understanding and states no further questions.

## 2022-03-10 ENCOUNTER — Other Ambulatory Visit: Payer: Self-pay | Admitting: Urology

## 2022-03-12 NOTE — H&P (Signed)
H&P  Chief Complaint: Lt upper ureteral stone  History of Present Illness: 77 yo male presents for ESL for (initial) mgmt of a Lt  upper ureteral stone. SSD 12 cm, HU ~750.  Past Medical History:  Diagnosis Date   Arthritis    MILD    COVID-19 virus infection    02-2020   History of kidney stones    HLD (hyperlipidemia)    Nephrolithiasis    per renal ultrasound 01-02-2019 in epic, left 38m   Prostate cancer ([facility] urologist-- dr eskridge/  dr mTammi Klippel  dx 01-14-2019, Stage T1, Gleason 4+5   Wears glasses     Past Surgical History:  Procedure Laterality Date   ABDOMINAL ADHESION SURGERY  1976   [facility]an Marino  APPENDECTOMY  1972   COLONOSCOPY  01/06/2002   normal   COLONOSCOPY  01/06/2002   CYSTOSCOPY N/A 05/13/2019   Procedure: CYSTOSCOPY FLEXIBLE;  Surgeon: EFestus Aloe MD;  [facility]  Service: Urology;  Laterality: N/A;  NO SEEDS FOUND IN BLADDER   RADIOACTIVE SEED IMPLANT N/A 05/13/2019   Procedure: RADIOACTIVE SEED IMPLANT/BRACHYTHERAPY IMPLANT;  Surgeon: EFestus Aloe MD;  [facility]  Service: Urology;  Laterality: N/A;   569 SEEDS IMPLANTED   SPACE OAR INSTILLATION N/A 05/13/2019   Procedure: SPACE OAR INSTILLATION;  Surgeon: EFestus Aloe MD;  [facility]  Service: Urology;  Laterality: N/A;    Home Medications:  Allergies as of 03/12/2022       Reactions   Amoxicillin    Other reaction(s): vomiting   Amoxicillin-pot Clavulanate Nausea And Vomiting   Avelox [moxifloxacin Hcl In Nacl] Nausea And Vomiting   Nsaids    Other reaction(s): elevated kidney  test        Medication List      Notice   Cannot display discharge medications because the patient has not yet been admitted.     Allergies:  Allergies  Allergen Reactions   Amoxicillin     Other reaction(s): vomiting   Amoxicillin-Pot Clavulanate Nausea And Vomiting   Avelox [Moxifloxacin Hcl In Nacl] Nausea And  Vomiting   Nsaids     Other reaction(s): elevated kidney  test    Family History  Problem Relation Age of Onset   Hyperlipidemia Brother    Colon cancer Neg Hx    Prostate cancer Neg Hx    Pancreatic cancer Neg Hx    Breast cancer Neg Hx    Colon polyps Neg Hx    Esophageal cancer Neg Hx     Social History:  reports that he has never smoked. He has never used smokeless tobacco. He reports that he does not drink alcohol and does not use drugs.  ROS: A complete review of systems was performed.  All systems are negative except for pertinent findings as noted.  Physical Exam:  Vital signs in last 24 hours: There were no vitals taken for this visit. Constitutional:  Alert and oriented, No acute distress Cardiovascular: Regular rate  Respiratory: Normal respiratory effort GI: Abdomen is soft, nontender, nondistended, no abdominal masses. No CVAT.  Genitourinary: Normal male phallus, testes are descended bilaterally and non-tender and without masses, scrotum is normal in appearance without lesions or masses, perineum is normal on inspection. Lymphatic: No lymphadenopathy Neurologic: Grossly intact, no focal deficits Psychiatric: Normal mood and affect  I have reviewed prior pt notes  I have reviewed urinalysis results  I have independently reviewed prior imaging  Impression/Assessment:  Lt upper ureteral stone  Plan:  ESL as first part of a possible staged procedure

## 2022-03-13 ENCOUNTER — Encounter (HOSPITAL_BASED_OUTPATIENT_CLINIC_OR_DEPARTMENT_OTHER): Payer: Self-pay | Admitting: Urology

## 2022-03-13 ENCOUNTER — Ambulatory Visit (HOSPITAL_COMMUNITY): Payer: Medicare Other

## 2022-03-13 ENCOUNTER — Ambulatory Visit (HOSPITAL_BASED_OUTPATIENT_CLINIC_OR_DEPARTMENT_OTHER)
Admission: RE | Admit: 2022-03-13 | Discharge: 2022-03-13 | Disposition: A | Discharge status: Home / Self Care | Payer: Medicare Other | Attending: Urology | Admitting: Urology

## 2022-03-13 ENCOUNTER — Other Ambulatory Visit: Payer: Self-pay

## 2022-03-13 ENCOUNTER — Encounter (HOSPITAL_BASED_OUTPATIENT_CLINIC_OR_DEPARTMENT_OTHER)
Admission: RE | Disposition: A | Discharge status: Home / Self Care | Payer: Self-pay | Source: Home / Self Care | Attending: Urology

## 2022-03-13 DIAGNOSIS — Z8546 Personal history of malignant neoplasm of prostate: Secondary | ICD-10-CM | POA: Diagnosis not present

## 2022-03-13 DIAGNOSIS — N201 Calculus of ureter: Secondary | ICD-10-CM | POA: Insufficient documentation

## 2022-03-13 DIAGNOSIS — Z01818 Encounter for other preprocedural examination: Secondary | ICD-10-CM | POA: Diagnosis not present

## 2022-03-13 DIAGNOSIS — E785 Hyperlipidemia, unspecified: Secondary | ICD-10-CM | POA: Diagnosis not present

## 2022-03-13 HISTORY — PX: EXTRACORPOREAL SHOCK WAVE LITHOTRIPSY: SHX1557

## 2022-03-13 SURGERY — LITHOTRIPSY, ESWL
Anesthesia: LOCAL | Laterality: Left

## 2022-03-13 MED ORDER — DIAZEPAM 5 MG PO TABS
10.0000 mg | ORAL_TABLET | ORAL | Status: AC
  Administered 2022-03-13: 10 mg via ORAL

## 2022-03-13 MED ORDER — SULFAMETHOXAZOLE-TRIMETHOPRIM 800-160 MG PO TABS
1.0000 | ORAL_TABLET | Freq: Once | ORAL | Status: AC
  Administered 2022-03-13: 1 via ORAL
  Filled 2022-03-13: qty 1

## 2022-03-13 MED ORDER — SODIUM CHLORIDE 0.9 % IV SOLN: INTRAVENOUS | Status: DC

## 2022-03-13 MED ORDER — SULFAMETHOXAZOLE-TRIMETHOPRIM 800-160 MG PO TABS: 1.0000 | ORAL_TABLET | Freq: Two times a day (BID) | ORAL | Status: DC

## 2022-03-13 MED ORDER — DIPHENHYDRAMINE HCL 25 MG PO CAPS
ORAL_CAPSULE | ORAL | Status: AC
  Filled 2022-03-13: qty 1

## 2022-03-13 MED ORDER — DIPHENHYDRAMINE HCL 25 MG PO CAPS
25.0000 mg | ORAL_CAPSULE | ORAL | Status: AC
  Administered 2022-03-13: 25 mg via ORAL

## 2022-03-13 MED ORDER — DIAZEPAM 5 MG PO TABS
ORAL_TABLET | ORAL | Status: AC
  Filled 2022-03-13: qty 2

## 2022-03-13 NOTE — Op Note (Signed)
See [facility] OP note scanned into chart. 

## 2022-03-13 NOTE — Op Note (Signed)
Procedure was aborted after 713 shocks due to question of patient taking nonsteroidal.  Please see note scanned in the chart

## 2022-03-13 NOTE — Progress Notes (Signed)
Before treatment, I confirmed with the wife that the patient had only been taking Tylenol over the past 2 to 3 days.  During the early stage of treatment, at approximately 700 shocks, patient's wife called back saying that she could not be sure whether he was taking the Tylenol for ibuprofen.  They were in the same medication box.  Because of her not being sure, the case was aborted.

## 2022-03-13 NOTE — Discharge Instructions (Signed)
See [facility] discharge instructions in chart.  

## 2022-03-13 NOTE — Interval H&P Note (Signed)
History and Physical Interval Note:  03/13/2022 10:36 AM  Shawn Walsh  has presented today for surgery, with the diagnosis of LEFT URETERAL CALCULI.  The various methods of treatment have been discussed with the patient and family. After consideration of risks, benefits and other options for treatment, the patient has consented to  Procedure(s) with comments: LEFT EXTRACORPOREAL SHOCK WAVE LITHOTRIPSY (ESWL) (Left) - 75 MINUTES as a surgical intervention.  The patient's history has been reviewed, patient examined, no change in status, stable for surgery.  I have reviewed the patient's chart and labs.  Questions were answered to the patient's satisfaction.     Lillette Boxer Jashawn Floyd

## 2022-03-14 ENCOUNTER — Encounter (HOSPITAL_BASED_OUTPATIENT_CLINIC_OR_DEPARTMENT_OTHER): Payer: Self-pay | Admitting: Urology

## 2022-03-15 ENCOUNTER — Other Ambulatory Visit: Payer: Self-pay | Admitting: Urology

## 2022-03-17 DIAGNOSIS — N201 Calculus of ureter: Secondary | ICD-10-CM | POA: Diagnosis not present

## 2022-03-17 DIAGNOSIS — R1084 Generalized abdominal pain: Secondary | ICD-10-CM | POA: Diagnosis not present

## 2022-03-17 NOTE — Progress Notes (Signed)
Pre-op phone call attempted. Left message for patient to return call.

## 2022-03-21 ENCOUNTER — Encounter (HOSPITAL_COMMUNITY): Payer: Self-pay

## 2022-03-21 ENCOUNTER — Emergency Department (HOSPITAL_COMMUNITY): Payer: Medicare Other

## 2022-03-21 ENCOUNTER — Inpatient Hospital Stay (HOSPITAL_COMMUNITY)
Admission: EM | Admit: 2022-03-21 | Discharge: 2022-03-24 | DRG: 660 | Disposition: A | Discharge status: Home / Self Care | Payer: Medicare Other | Attending: Internal Medicine | Admitting: Internal Medicine

## 2022-03-21 ENCOUNTER — Other Ambulatory Visit: Payer: Self-pay

## 2022-03-21 DIAGNOSIS — N134 Hydroureter: Secondary | ICD-10-CM | POA: Diagnosis not present

## 2022-03-21 DIAGNOSIS — N201 Calculus of ureter: Secondary | ICD-10-CM | POA: Diagnosis not present

## 2022-03-21 DIAGNOSIS — B9620 Unspecified Escherichia coli [E. coli] as the cause of diseases classified elsewhere: Secondary | ICD-10-CM | POA: Diagnosis present

## 2022-03-21 DIAGNOSIS — N3000 Acute cystitis without hematuria: Secondary | ICD-10-CM

## 2022-03-21 DIAGNOSIS — K5900 Constipation, unspecified: Secondary | ICD-10-CM | POA: Diagnosis not present

## 2022-03-21 DIAGNOSIS — R109 Unspecified abdominal pain: Principal | ICD-10-CM

## 2022-03-21 DIAGNOSIS — Z8616 Personal history of COVID-19: Secondary | ICD-10-CM

## 2022-03-21 DIAGNOSIS — N132 Hydronephrosis with renal and ureteral calculous obstruction: Secondary | ICD-10-CM | POA: Diagnosis not present

## 2022-03-21 DIAGNOSIS — Z8546 Personal history of malignant neoplasm of prostate: Secondary | ICD-10-CM

## 2022-03-21 DIAGNOSIS — Z8349 Family history of other endocrine, nutritional and metabolic diseases: Secondary | ICD-10-CM

## 2022-03-21 DIAGNOSIS — N390 Urinary tract infection, site not specified: Secondary | ICD-10-CM | POA: Diagnosis present

## 2022-03-21 DIAGNOSIS — Z880 Allergy status to penicillin: Secondary | ICD-10-CM

## 2022-03-21 DIAGNOSIS — M1990 Unspecified osteoarthritis, unspecified site: Secondary | ICD-10-CM | POA: Diagnosis present

## 2022-03-21 DIAGNOSIS — Z886 Allergy status to analgesic agent status: Secondary | ICD-10-CM

## 2022-03-21 DIAGNOSIS — E785 Hyperlipidemia, unspecified: Secondary | ICD-10-CM

## 2022-03-21 DIAGNOSIS — Z79899 Other long term (current) drug therapy: Secondary | ICD-10-CM | POA: Diagnosis not present

## 2022-03-21 DIAGNOSIS — N179 Acute kidney failure, unspecified: Secondary | ICD-10-CM | POA: Diagnosis not present

## 2022-03-21 DIAGNOSIS — Z1612 Extended spectrum beta lactamase (ESBL) resistance: Secondary | ICD-10-CM | POA: Diagnosis present

## 2022-03-21 DIAGNOSIS — Z923 Personal history of irradiation: Secondary | ICD-10-CM

## 2022-03-21 DIAGNOSIS — N136 Pyonephrosis: Principal | ICD-10-CM | POA: Diagnosis present

## 2022-03-21 DIAGNOSIS — N1330 Unspecified hydronephrosis: Secondary | ICD-10-CM | POA: Diagnosis not present

## 2022-03-21 DIAGNOSIS — Z87442 Personal history of urinary calculi: Secondary | ICD-10-CM

## 2022-03-21 DIAGNOSIS — E7849 Other hyperlipidemia: Secondary | ICD-10-CM | POA: Diagnosis not present

## 2022-03-21 DIAGNOSIS — N23 Unspecified renal colic: Secondary | ICD-10-CM | POA: Diagnosis not present

## 2022-03-21 LAB — CBC WITH DIFFERENTIAL/PLATELET
Abs Immature Granulocytes: 0.02 10*3/uL (ref 0.00–0.07)
Basophils Absolute: 0 10*3/uL (ref 0.0–0.1)
Basophils Relative: 0 %
Eosinophils Absolute: 0.3 10*3/uL (ref 0.0–0.5)
Eosinophils Relative: 4 %
HCT: 40 % (ref 39.0–52.0)
Hemoglobin: 13 g/dL (ref 13.0–17.0)
Immature Granulocytes: 0 %
Lymphocytes Relative: 31 %
Lymphs Abs: 2.3 10*3/uL (ref 0.7–4.0)
MCH: 30.7 pg (ref 26.0–34.0)
MCHC: 32.5 g/dL (ref 30.0–36.0)
MCV: 94.6 fL (ref 80.0–100.0)
Monocytes Absolute: 0.4 10*3/uL (ref 0.1–1.0)
Monocytes Relative: 6 %
Neutro Abs: 4.3 10*3/uL (ref 1.7–7.7)
Neutrophils Relative %: 59 %
Platelets: 218 10*3/uL (ref 150–400)
RBC: 4.23 MIL/uL (ref 4.22–5.81)
RDW: 13.9 % (ref 11.5–15.5)
WBC: 7.4 10*3/uL (ref 4.0–10.5)
nRBC: 0 % (ref 0.0–0.2)

## 2022-03-21 LAB — BASIC METABOLIC PANEL
Anion gap: 10 (ref 5–15)
BUN: 30 mg/dL — ABNORMAL HIGH (ref 8–23)
CO2: 24 mmol/L (ref 22–32)
Calcium: 9.1 mg/dL (ref 8.9–10.3)
Chloride: 104 mmol/L (ref 98–111)
Creatinine, Ser: 1.36 mg/dL — ABNORMAL HIGH (ref 0.61–1.24)
GFR, Estimated: 54 mL/min — ABNORMAL LOW (ref >60)
Glucose, Bld: 125 mg/dL — ABNORMAL HIGH (ref 70–99)
Potassium: 3.8 mmol/L (ref 3.5–5.1)
Sodium: 138 mmol/L (ref 135–145)

## 2022-03-21 LAB — URINALYSIS, W/ REFLEX TO CULTURE (INFECTION SUSPECTED)
Bacteria, UA: NONE SEEN
Bilirubin Urine: NEGATIVE
Glucose, UA: NEGATIVE mg/dL
Hgb urine dipstick: NEGATIVE
Ketones, ur: 5 mg/dL — AB
Leukocytes,Ua: NEGATIVE
Nitrite: NEGATIVE
Protein, ur: NEGATIVE mg/dL
Specific Gravity, Urine: 1.027 (ref 1.005–1.030)
pH: 5 (ref 5.0–8.0)

## 2022-03-21 MED ORDER — PROCHLORPERAZINE EDISYLATE 10 MG/2ML IJ SOLN: 10.0000 mg | Freq: Four times a day (QID) | INTRAMUSCULAR | Status: DC | PRN

## 2022-03-21 MED ORDER — MORPHINE SULFATE (PF) 4 MG/ML IV SOLN
4.0000 mg | Freq: Once | INTRAVENOUS | Status: AC
  Administered 2022-03-21: 4 mg via INTRAVENOUS
  Filled 2022-03-21: qty 1

## 2022-03-21 MED ORDER — SODIUM CHLORIDE 0.9 % IV SOLN: Freq: Once | INTRAVENOUS | Status: DC

## 2022-03-21 MED ORDER — SODIUM CHLORIDE 0.9 % IV BOLUS
1000.0000 mL | Freq: Once | INTRAVENOUS | Status: AC
  Administered 2022-03-21: 1000 mL via INTRAVENOUS

## 2022-03-21 MED ORDER — PROCHLORPERAZINE EDISYLATE 10 MG/2ML IJ SOLN
10.0000 mg | Freq: Once | INTRAMUSCULAR | Status: AC
  Administered 2022-03-21: 10 mg via INTRAVENOUS
  Filled 2022-03-21: qty 2

## 2022-03-21 MED ORDER — HYDROMORPHONE HCL 1 MG/ML IJ SOLN
0.5000 mg | Freq: Once | INTRAMUSCULAR | Status: AC
  Administered 2022-03-21: 0.5 mg via INTRAVENOUS
  Filled 2022-03-21: qty 1

## 2022-03-21 MED ORDER — ONDANSETRON HCL 4 MG/2ML IJ SOLN
4.0000 mg | Freq: Once | INTRAMUSCULAR | Status: AC
  Administered 2022-03-21: 4 mg via INTRAVENOUS
  Filled 2022-03-21: qty 2

## 2022-03-21 MED ORDER — ACETAMINOPHEN 650 MG RE SUPP: 650.0000 mg | Freq: Four times a day (QID) | RECTAL | Status: DC | PRN

## 2022-03-21 MED ORDER — MORPHINE SULFATE (PF) 2 MG/ML IV SOLN
2.0000 mg | INTRAVENOUS | Status: DC | PRN
  Administered 2022-03-21 – 2022-03-23 (×5): 2 mg via INTRAVENOUS
  Filled 2022-03-21 (×5): qty 1

## 2022-03-21 MED ORDER — MORPHINE SULFATE (PF) 4 MG/ML IV SOLN
4.0000 mg | INTRAVENOUS | Status: DC | PRN
  Administered 2022-03-22 – 2022-03-24 (×5): 4 mg via INTRAVENOUS
  Filled 2022-03-21 (×5): qty 1

## 2022-03-21 MED ORDER — HYDROMORPHONE HCL 1 MG/ML IJ SOLN
1.0000 mg | Freq: Once | INTRAMUSCULAR | Status: AC
  Administered 2022-03-21: 1 mg via INTRAVENOUS
  Filled 2022-03-21: qty 1

## 2022-03-21 MED ORDER — LACTATED RINGERS IV SOLN: INTRAVENOUS | Status: DC

## 2022-03-21 MED ORDER — ACETAMINOPHEN 325 MG PO TABS: 650.0000 mg | ORAL_TABLET | Freq: Four times a day (QID) | ORAL | Status: DC | PRN

## 2022-03-21 NOTE — Assessment & Plan Note (Signed)
Observation med/surg bed. Continue with IVF. NPO after MN. EDP has discussed with urology(manny). SCDs for DVT prophylaxis. Prn compazine for N/V. IV morphine prn for pain.

## 2022-03-21 NOTE — Subjective & Objective (Signed)
CC: flank pain, N/V HPI: 77 year old male with a history of kidney stones, hyperlipidemia presents to the ER today with worsening pain, nausea and vomiting.  He was scheduled for shockwave lithotripsy on 03/13/2022 but this was canceled mid procedure due to possibility the patient taking ibuprofen.  Patient states they did not have any procedure done.  Operative note states that he had partial procedure performed.  Patient complains of continued left flank pain along with nausea vomiting.  Denies any fever or chills.  Denies any dysuria.  Temp 98.4 heart rate 73 blood pressure 122/96.  Labs sodium 138, potassium 3.8, chloride 104, bicarb 24, BUN 30, creatinine 1.36, glucose 125  UA showed no blood, no leukocyte esterase, no nitrates.  White count 7.4, hemoglobin 13, platelets of 218  CT urogram shows a 5 mm stone within the proximal left ureter.  Additional stones are seen in distal ureter measuring 4 mm.  There is mild left hydroureteronephrosis with slightly asymmetric Perry nephric stranding.  EDP discussed the case with urology on-call Dr. Tresa Moore  [facility] hospitalist contacted for admission.

## 2022-03-21 NOTE — H&P (Signed)
History and Physical    Shawn Walsh [id number] DOB: 18-May-1945 DOA: 03/21/2022  DOS: the patient was seen and examined on 03/21/2022  PCP: Lawerance Cruel, MD   Patient coming from: Home  I have personally briefly reviewed patient's old medical records in [place]  CC: flank pain, N/V HPI: 77 year old male with a history of kidney stones, hyperlipidemia presents to the ER today with worsening pain, nausea and vomiting.  He was scheduled for shockwave lithotripsy on 03/13/2022 but this was canceled mid procedure due to possibility the patient taking ibuprofen.  Patient states they did not have any procedure done.  Operative note states that he had partial procedure performed.  Patient complains of continued left flank pain along with nausea vomiting.  Denies any fever or chills.  Denies any dysuria.  Temp 98.4 heart rate 73 blood pressure 122/96.  Labs sodium 138, potassium 3.8, chloride 104, bicarb 24, BUN 30, creatinine 1.36, glucose 125  UA showed no blood, no leukocyte esterase, no nitrates.  White count 7.4, hemoglobin 13, platelets of 218  CT urogram shows a 5 mm stone within the proximal left ureter.  Additional stones are seen in distal ureter measuring 4 mm.  There is mild left hydroureteronephrosis with slightly asymmetric Perry nephric stranding.  EDP discussed the case with urology on-call Dr. Tresa Moore  [facility] hospitalist contacted for admission.   ED Course: CT urology shows left ureter stones 57mm, 4 mm, slight left hydroureteronephrosis  Review of Systems:  Review of Systems  Constitutional: Negative.   HENT: Negative.    Eyes: Negative.   Respiratory: Negative.    Cardiovascular: Negative.   Gastrointestinal:  Positive for nausea and vomiting.  Genitourinary:  Positive for flank pain. Negative for hematuria.  Skin: Negative.   Neurological: Negative.   Endo/Heme/Allergies: Negative.   Psychiatric/Behavioral: Negative.    All other systems  reviewed and are negative.   Past Medical History:  Diagnosis Date   Arthritis    MILD    COVID-19 virus infection    02-2020   History of kidney stones    HLD (hyperlipidemia)    Nephrolithiasis    per renal ultrasound 01-02-2019 in epic, left 30mm   Prostate cancer [facility]) urologist-- dr eskridge/  dr Tammi Klippel   dx 01-14-2019, Stage T1, Gleason 4+5   Wears glasses     Past Surgical History:  Procedure Laterality Date   ABDOMINAL ADHESION SURGERY  1976   [place]   APPENDECTOMY  1972   COLONOSCOPY  01/06/2002   normal   COLONOSCOPY  01/06/2002   CYSTOSCOPY N/A 05/13/2019   Procedure: CYSTOSCOPY FLEXIBLE;  Surgeon: Festus Aloe, MD;  Location: [facility];  Service: Urology;  Laterality: N/A;  NO SEEDS FOUND IN BLADDER   EXTRACORPOREAL SHOCK WAVE LITHOTRIPSY Left 03/13/2022   Procedure: LEFT EXTRACORPOREAL SHOCK WAVE LITHOTRIPSY (ESWL);  Surgeon: Franchot Gallo, MD;  Location: [facility];  Service: Urology;  Laterality: Left;  75 MINUTES   RADIOACTIVE SEED IMPLANT N/A 05/13/2019   Procedure: RADIOACTIVE SEED IMPLANT/BRACHYTHERAPY IMPLANT;  Surgeon: Festus Aloe, MD;  Location: [facility];  Service: Urology;  Laterality: N/A;   53  SEEDS IMPLANTED   SPACE OAR INSTILLATION N/A 05/13/2019   Procedure: SPACE OAR INSTILLATION;  Surgeon: Festus Aloe, MD;  Location: [facility];  Service: Urology;  Laterality: N/A;     reports that he has never smoked. He has never used smokeless tobacco. He reports that he does not drink alcohol and  does not use drugs.  Allergies  Allergen Reactions   Amoxicillin     Other reaction(s): vomiting   Amoxicillin-Pot Clavulanate Nausea And Vomiting   Avelox [Moxifloxacin Hcl In Nacl] Nausea And Vomiting   Moxifloxacin Nausea And Vomiting   Nsaids     Other reaction(s): elevated kidney  test    Family History  Problem Relation Age of Onset   Hyperlipidemia Brother    Colon  cancer Neg Hx    Prostate cancer Neg Hx    Pancreatic cancer Neg Hx    Breast cancer Neg Hx    Colon polyps Neg Hx    Esophageal cancer Neg Hx     Prior to Admission medications   Medication Sig Start Date End Date Taking? Authorizing Provider  Cholecalciferol (VITAMIN D3) 1000 UNITS CAPS Take by mouth daily.     [provider]  Multiple Vitamins-Minerals (CENTRUM SILVER PO) Take by mouth daily.     [provider]  omeprazole (PRILOSEC) 10 MG capsule Take 20 mg by mouth daily.    [provider]  ondansetron (ZOFRAN-ODT) 4 MG disintegrating tablet Take 1 tablet (4 mg total) by mouth every 8 (eight) hours as needed for up to 15 doses for nausea or vomiting. 03/31/21   Wyvonnia Dusky, MD  rosuvastatin (CRESTOR) 5 MG tablet Take 1 tablet by mouth daily.    [provider]    Physical Exam: Vitals:   03/21/22 1945 03/21/22 2000 03/21/22 2015 03/21/22 2027  BP: (!) 141/91 130/80 (!) 140/83 (!) 140/83  Pulse: 81 80 82 81  Resp:    18  Temp:    98.4 F (36.9 C)  TempSrc:      SpO2: 95% 99% 99% 100%  Weight:      Height:        Physical Exam Vitals and nursing note reviewed.  Constitutional:      General: He is not in acute distress.    Appearance: Normal appearance. He is normal weight. He is not ill-appearing, toxic-appearing or diaphoretic.  HENT:     Head: Normocephalic and atraumatic.     Nose: Nose normal.  Cardiovascular:     Rate and Rhythm: Normal rate and regular rhythm.     Pulses: Normal pulses.  Pulmonary:     Effort: Pulmonary effort is normal.     Breath sounds: Normal breath sounds.  Abdominal:     General: Abdomen is flat. Bowel sounds are normal. There is no distension.     Palpations: Abdomen is soft.     Tenderness: There is no abdominal tenderness. There is left CVA tenderness.     Comments: Actively vomiting  Musculoskeletal:     Right lower leg: No edema.     Left lower leg: No edema.  Skin:    General: Skin  is warm and dry.     Capillary Refill: Capillary refill takes less than 2 seconds.  Neurological:     General: No focal deficit present.     Mental Status: He is alert and oriented to person, place, and time.      Labs on Admission: I have personally reviewed following labs and imaging studies  CBC: Recent Labs  Lab 03/21/22 1723  WBC 7.4  NEUTROABS 4.3  HGB 13.0  HCT 40.0  MCV 94.6  PLT 932   Basic Metabolic Panel: Recent Labs  Lab 03/21/22 1723  NA 138  K 3.8  CL 104  CO2 24  GLUCOSE 125*  BUN  30*  CREATININE 1.36*  CALCIUM 9.1   GFR: Estimated Creatinine Clearance: 44.4 mL/min (A) (by C-G formula based on SCr of 1.36 mg/dL (H)).  Urine analysis:    Component Value Date/Time   COLORURINE AMBER (A) 03/21/2022 1721   APPEARANCEUR CLOUDY (A) 03/21/2022 1721   LABSPEC 1.027 03/21/2022 1721   PHURINE 5.0 03/21/2022 1721   GLUCOSEU NEGATIVE 03/21/2022 1721   HGBUR NEGATIVE 03/21/2022 1721   BILIRUBINUR NEGATIVE 03/21/2022 1721   KETONESUR 5 (A) 03/21/2022 1721   PROTEINUR NEGATIVE 03/21/2022 1721   UROBILINOGEN 0.2 08/20/2012 1830   NITRITE NEGATIVE 03/21/2022 1721   LEUKOCYTESUR NEGATIVE 03/21/2022 1721    Radiological Exams on Admission: I have personally reviewed images CT Renal Stone Study  Result Date: 03/21/2022 CLINICAL DATA:  Several hour history of left flank pain radiating into the abdomen with nausea and vomiting EXAM: CT ABDOMEN AND PELVIS WITHOUT CONTRAST TECHNIQUE: Multidetector CT imaging of the abdomen and pelvis was performed following the standard protocol without IV contrast. RADIATION DOSE REDUCTION: This exam was performed according to the departmental dose-optimization program which includes automated exposure control, adjustment of the mA and/or kV according to patient size and/or use of iterative reconstruction technique. COMPARISON:  CT abdomen and pelvis dated 02/14/2022 FINDINGS: Lower chest: No focal consolidation or pulmonary nodule in  the lung bases. No pleural effusion or pneumothorax demonstrated. Partially imaged heart size is normal. Hepatobiliary: No focal hepatic lesions. No intra or extrahepatic biliary ductal dilation. Normal gallbladder. Pancreas: No focal lesions or main ductal dilation. Spleen: Normal in size without focal abnormality. Adrenals/Urinary Tract: No adrenal nodules. Persistent 5 mm stone within the proximal left ureter. Additional stones are also seen within the distal ureter measuring up to 4 mm. Mild associated left hydroureteronephrosis with slightly asymmetric perinephric stranding. No right hydronephrosis or calculi. No focal bladder wall thickening. Stomach/Bowel: Normal appearance of the stomach. No evidence of bowel wall thickening, distention, or inflammatory changes. Normal appendix. Vascular/Lymphatic: Aortic atherosclerosis. No enlarged abdominal or pelvic lymph nodes. 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 Other: No free fluid, fluid collection, or free air. Musculoskeletal: No acute or abnormal lytic or blastic osseous lesions. 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 IMPRESSION: 1. New mild left hydroureteronephrosis with multiple left ureteral stones as described. 2.  Aortic Atherosclerosis (ICD10-I70.0). Electronically Signed   By: Darrin Nipper M.D.   On: 03/21/2022 18:21    EKG: My personal interpretation of EKG shows: no EKG to review  Assessment/Plan Principal Problem:   Ureterolithiasis Active Problems:   AKI (acute kidney injury) ([place])   HLD (hyperlipidemia)    Assessment and Plan: * Ureterolithiasis Observation med/surg bed. Continue with IVF. NPO after MN. EDP has discussed with urology(manny). SCDs for DVT prophylaxis. Prn compazine for N/V. IV morphine prn for pain.  AKI (acute kidney injury) ([place]) Continue with IVF. Repeat BMP in AM.  HLD (hyperlipidemia) On crestor at home. Hold while NPO.   DVT prophylaxis:  SCDs Code Status: Full Code Family Communication: discussed with pt and his wife at bedside  Disposition Plan: return home  Consults called: EDP has consulted Dr. Tresa Moore with urology  Admission status: Observation, Med-Surg   Kristopher Oppenheim, DO [facility] 03/21/2022, 9:55 PM

## 2022-03-21 NOTE — ED Provider Notes (Signed)
[place] EMERGENCY DEPARTMENT AT [facility] Provider Note   CSN: [id number] Arrival date & time: 03/21/22  1701     History  Chief Complaint  Patient presents with   Flank Pain    Shawn Walsh is a 77 y.o. male.  77 year old male with prior medical history as detailed below presents for evaluation.  Patient complains of worsening left-sided flank pain.  Patient is status post lithotripsy for ureteral stones on the left.  He reports increased pain today.  He took Percocet at home without any control of his pain.  He denies fever, nausea, vomiting, other complaint.    The history is provided by the patient and medical records.       Home Medications Prior to Admission medications   Medication Sig Start Date End Date Taking? Authorizing Provider  Cholecalciferol (VITAMIN D3) 1000 UNITS CAPS Take by mouth daily.     [provider]  Multiple Vitamins-Minerals (CENTRUM SILVER PO) Take by mouth daily.     [provider]  omeprazole (PRILOSEC) 10 MG capsule Take 20 mg by mouth daily.    [provider]  ondansetron (ZOFRAN-ODT) 4 MG disintegrating tablet Take 1 tablet (4 mg total) by mouth every 8 (eight) hours as needed for up to 15 doses for nausea or vomiting. 03/31/21   Wyvonnia Dusky, MD  rosuvastatin (CRESTOR) 5 MG tablet Take 1 tablet by mouth daily.    [provider]      Allergies    Amoxicillin, Amoxicillin-pot clavulanate, Avelox [moxifloxacin hcl in nacl], Moxifloxacin, and Nsaids    Review of Systems   Review of Systems  All other systems reviewed and are negative.   Physical Exam Updated Vital Signs BP (!) 144/84   Pulse 83   Temp 98.4 F (36.9 C)   Resp 18   Ht 5\' 8"  (1.727 m)   Wt 68 kg   SpO2 93%   BMI 22.79 kg/m  Physical Exam Vitals and nursing note reviewed.  Constitutional:      General: He is not in acute distress.    Appearance: Normal appearance. He is well-developed.  HENT:      Head: Normocephalic and atraumatic.  Eyes:     Conjunctiva/sclera: Conjunctivae normal.     Pupils: Pupils are equal, round, and reactive to light.  Cardiovascular:     Rate and Rhythm: Normal rate and regular rhythm.     Heart sounds: Normal heart sounds.  Pulmonary:     Effort: Pulmonary effort is normal. No respiratory distress.     Breath sounds: Normal breath sounds.  Abdominal:     General: There is no distension.     Palpations: Abdomen is soft.     Tenderness: There is no abdominal tenderness.  Musculoskeletal:        General: No deformity. Normal range of motion.     Cervical back: Normal range of motion and neck supple.  Skin:    General: Skin is warm and dry.  Neurological:     General: No focal deficit present.     Mental Status: He is alert and oriented to person, place, and time.     ED Results / Procedures / Treatments   Labs (all labs ordered are listed, but only abnormal results are displayed) Labs Reviewed  BASIC METABOLIC PANEL - Abnormal; Notable for the following components:      Result Value   Glucose, Bld 125 (*)    BUN 30 (*)  Creatinine, Ser 1.36 (*)    GFR, Estimated 54 (*)    All other components within normal limits  URINALYSIS, W/ REFLEX TO CULTURE (INFECTION SUSPECTED) - Abnormal; Notable for the following components:   Color, Urine AMBER (*)    APPearance CLOUDY (*)    Ketones, ur 5 (*)    All other components within normal limits  CBC WITH DIFFERENTIAL/PLATELET    EKG None  Radiology CT Renal Stone Study  Result Date: 03/21/2022 CLINICAL DATA:  Several hour history of left flank pain radiating into the abdomen with nausea and vomiting EXAM: CT ABDOMEN AND PELVIS WITHOUT CONTRAST TECHNIQUE: Multidetector CT imaging of the abdomen and pelvis was performed following the standard protocol without IV contrast. RADIATION DOSE REDUCTION: This exam was performed according to the departmental dose-optimization program which includes  automated exposure control, adjustment of the mA and/or kV according to patient size and/or use of iterative reconstruction technique. COMPARISON:  CT abdomen and pelvis dated 02/14/2022 FINDINGS: Lower chest: No focal consolidation or pulmonary nodule in the lung bases. No pleural effusion or pneumothorax demonstrated. Partially imaged heart size is normal. Hepatobiliary: No focal hepatic lesions. No intra or extrahepatic biliary ductal dilation. Normal gallbladder. Pancreas: No focal lesions or main ductal dilation. Spleen: Normal in size without focal abnormality. Adrenals/Urinary Tract: No adrenal nodules. Persistent 5 mm stone within the proximal left ureter. Additional stones are also seen within the distal ureter measuring up to 4 mm. Mild associated left hydroureteronephrosis with slightly asymmetric perinephric stranding. No right hydronephrosis or calculi. No focal bladder wall thickening. Stomach/Bowel: Normal appearance of the stomach. No evidence of bowel wall thickening, distention, or inflammatory changes. Normal appendix. Vascular/Lymphatic: Aortic atherosclerosis. No enlarged abdominal or pelvic lymph nodes. 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 Other: No free fluid, fluid collection, or free air. Musculoskeletal: No acute or abnormal lytic or blastic osseous lesions. 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 IMPRESSION: 1. New mild left hydroureteronephrosis with multiple left ureteral stones as described. 2.  Aortic Atherosclerosis (ICD10-I70.0). Electronically Signed   By: Darrin Nipper M.D.   On: 03/21/2022 18:21    Procedures Procedures    Medications Ordered in ED Medications  prochlorperazine (COMPAZINE) injection 10 mg (has no administration in time range)  lactated ringers infusion (has no administration in time range)  morphine (PF) 4 MG/ML injection 4 mg (4 mg Intravenous Given 03/21/22 1754)  ondansetron (ZOFRAN)  injection 4 mg (4 mg Intravenous Given 03/21/22 1754)  sodium chloride 0.9 % bolus 1,000 mL (0 mLs Intravenous Stopped 03/21/22 2212)  HYDROmorphone (DILAUDID) injection 0.5 mg (0.5 mg Intravenous Given 03/21/22 1827)  HYDROmorphone (DILAUDID) injection 1 mg (1 mg Intravenous Given 03/21/22 1921)    ED Course/ Medical Decision Making/ A&P                             Medical Decision Making Amount and/or Complexity of Data Reviewed Labs: ordered. Radiology: ordered.  Risk Prescription drug management. Decision regarding hospitalization.    Medical Screen Complete  This patient presented to the ED with complaint of left flank pain.  This complaint involves an extensive number of treatment options. The initial differential diagnosis includes, but is not limited to, ureteral colic, metabolic abnormality, etc  This presentation is: Acute, Self-Limited, Previously Undiagnosed, Uncertain Prognosis, Complicated, and Systemic Symptoms  Patient presented with left-sided flank pain.  Patient with recent lithotripsy . Patient with poorly controlled pain at home  secondary to passage of ureteral stone fragments.  With IV fluids, multiple doses of narcotics the patient's pain is still not controlled.  Labs are remarkable for mildly elevated creatinine at 1.3 with a BUN of 30.  Dr. Tresa Moore with urology is aware of case.  Agrees with plan for admission for pain control.  Hospitalist service is made aware of case and will evaluate for admission.       Additional history obtained:  External records from outside sources obtained and reviewed including prior ED visits and prior Inpatient records.    Lab Tests:  I ordered and personally interpreted labs.  The pertinent results include:  CBC BMP UA    Imaging Studies ordered:  I ordered imaging studies including ct renal study  I independently visualized and interpreted obtained imaging which showed left ureteral stones with  hydronephrosis  I agree with the radiologist interpretation.   Cardiac Monitoring:  The patient was maintained on a cardiac monitor.  I personally viewed and interpreted the cardiac monitor which showed an underlying rhythm of: NSR   Medicines ordered:  I ordered medication including IVC, Morphine, Dilaudid  for pain  Reevaluation of the patient after these medicines showed that the patient: improved  Problem List / ED Course:  Ureteral colic, AKI   Reevaluation:  After the interventions noted above, I reevaluated the patient and found that they have: improved   Disposition:  After consideration of the diagnostic results and the patients response to treatment, I feel that the patent would benefit from admission.          Final Clinical Impression(s) / ED Diagnoses Final diagnoses:  Left flank pain    Rx / DC Orders ED Discharge Orders     None         Valarie Merino, MD 03/21/22 2250

## 2022-03-21 NOTE — Assessment & Plan Note (Signed)
Continue with IVF. Repeat BMP in AM. 

## 2022-03-21 NOTE — ED Triage Notes (Addendum)
C/o left flank pain radiating into abd with n/v x3hours.  Oxycodone 1hr PTA w/o relief.  Lithotripsy scheduled for 3/22.

## 2022-03-21 NOTE — Progress Notes (Signed)
Talked with patient. Instructions given Hx and meds reviewed. Arrival time 0800 wife is the driver. Was cancelled on 03/13/22. Wife will stay in waiting room. NPO after MN.

## 2022-03-21 NOTE — ED Notes (Signed)
ED TO INPATIENT HANDOFF REPORT  ED Nurse Name and Phone #: Alroy Bailiff Name/Age/Gender Shawn Walsh 77 y.o. male Room/Bed: WA03/WA03  Code Status   Code Status: Full Code  Home/SNF/Other Home Patient oriented to: self, place, time, and situation Is this baseline? Yes   Triage Complete: Triage complete  Chief Complaint Ureterolithiasis [N20.1]  Triage Note C/o left flank pain radiating into abd with n/v x3hours.  Oxycodone 1hr PTA w/o relief.  Lithotripsy scheduled for 3/22.     Allergies Allergies  Allergen Reactions   Amoxicillin     Other reaction(s): vomiting   Amoxicillin-Pot Clavulanate Nausea And Vomiting   Avelox [Moxifloxacin Hcl In Nacl] Nausea And Vomiting   Moxifloxacin Nausea And Vomiting   Nsaids     Other reaction(s): elevated kidney  test    Level of Care/Admitting Diagnosis ED Disposition     ED Disposition  Admit   Condition  --   Comment  Hospital Area: [facility] [id number]  Level of Care: Med-Surg [16]  May place patient in observation at [facility] or [place] if equivalent level of care is available:: No  Covid Evaluation: Asymptomatic - no recent exposure (last 10 days) testing not required  Diagnosis: Ureterolithiasis [id number]  Admitting Physician: Bridgett Larsson, Yantis  Attending Physician: Bridgett Larsson, ERIC [[id number]]          B Medical/Surgery History Past Medical History:  Diagnosis Date   Arthritis    MILD    COVID-19 virus infection    02-2020   History of kidney stones    HLD (hyperlipidemia)    Nephrolithiasis    per renal ultrasound 01-02-2019 in epic, left 57mm   Prostate cancer [facility]) urologist-- dr eskridge/  dr Tammi Klippel   dx 01-14-2019, Stage T1, Gleason 4+5   Wears glasses    Past Surgical History:  Procedure Laterality Date   ABDOMINAL ADHESION SURGERY  1976   [place]   APPENDECTOMY  1972   COLONOSCOPY  01/06/2002   normal   COLONOSCOPY  01/06/2002   CYSTOSCOPY N/A 05/13/2019   Procedure:  CYSTOSCOPY FLEXIBLE;  Surgeon: Festus Aloe, MD;  [facility];  Service: Urology;  Laterality: N/A;  NO SEEDS FOUND IN BLADDER   EXTRACORPOREAL SHOCK WAVE LITHOTRIPSY Left 03/13/2022   Procedure: LEFT EXTRACORPOREAL SHOCK WAVE LITHOTRIPSY (ESWL);  Surgeon: Franchot Gallo, MD;  Location: [facility];  Service: Urology;  Laterality: Left;  75 MINUTES   RADIOACTIVE SEED IMPLANT N/A 05/13/2019   Procedure: RADIOACTIVE SEED IMPLANT/BRACHYTHERAPY IMPLANT;  Surgeon: Festus Aloe, MD;  Location: [facility];  Service: Urology;  Laterality: N/A;   53  SEEDS IMPLANTED   SPACE OAR INSTILLATION N/A 05/13/2019   Procedure: SPACE OAR INSTILLATION;  Surgeon: Festus Aloe, MD;  Location: [facility];  Service: Urology;  Laterality: N/A;     A IV Location/Drains/Wounds Patient Lines/Drains/Airways Status     Active Line/Drains/Airways     Name Placement date Placement time Site Days   Peripheral IV 03/21/22 18 G Anterior;Distal;Right;Upper Arm 03/21/22  1727  Arm  less than 1            Intake/Output Last 24 hours No intake or output data in the 24 hours ending 03/21/22 2212  Labs/Imaging Results for orders placed or performed during the hospital encounter of 03/21/22 (from the past 48 hour(s))  Urinalysis, w/ Reflex to Culture (Infection Suspected) -Urine, Clean Catch     Status: Abnormal   Collection Time: 03/21/22  5:21 PM  Result Value Ref Range   Specimen Source URINE, CLEAN CATCH    Color, Urine AMBER (A) YELLOW    Comment: BIOCHEMICALS MAY BE AFFECTED BY COLOR   APPearance CLOUDY (A) CLEAR   Specific Gravity, Urine 1.027 1.005 - 1.030   pH 5.0 5.0 - 8.0   Glucose, UA NEGATIVE NEGATIVE mg/dL   Hgb urine dipstick NEGATIVE NEGATIVE   Bilirubin Urine NEGATIVE NEGATIVE   Ketones, ur 5 (A) NEGATIVE mg/dL   Protein, ur NEGATIVE NEGATIVE mg/dL   Nitrite NEGATIVE NEGATIVE   Leukocytes,Ua NEGATIVE NEGATIVE    RBC / HPF 6-10 0 - 5 RBC/hpf   WBC, UA 0-5 0 - 5 WBC/hpf    Comment:        Reflex urine culture not performed if WBC <=10, OR if Squamous epithelial cells >5. If Squamous epithelial cells >5 suggest recollection.    Bacteria, UA NONE SEEN NONE SEEN   Squamous Epithelial / HPF 0-5 0 - 5 /HPF   Mucus PRESENT    Ca Oxalate Crys, UA PRESENT     Comment: Performed at [facility], [place] [address], [place], [place] [id number]  Basic metabolic panel     Status: Abnormal   Collection Time: 03/21/22  5:23 PM  Result Value Ref Range   Sodium 138 135 - 145 mmol/L   Potassium 3.8 3.5 - 5.1 mmol/L   Chloride 104 98 - 111 mmol/L   CO2 24 22 - 32 mmol/L   Glucose, Bld 125 (H) 70 - 99 mg/dL    Comment: Glucose reference range applies only to samples taken after fasting for at least 8 hours.   BUN 30 (H) 8 - 23 mg/dL   Creatinine, Ser 1.36 (H) 0.61 - 1.24 mg/dL   Calcium 9.1 8.9 - 10.3 mg/dL   GFR, Estimated 54 (L) >60 mL/min    Comment: (NOTE) Calculated using the CKD-EPI Creatinine Equation (2021)    Anion gap 10 5 - 15    Comment: Performed at [facility], [place] [address], [place], [place] [address]  CBC with Differential     Status: None   Collection Time: 03/21/22  5:23 PM  Result Value Ref Range   WBC 7.4 4.0 - 10.5 K/uL   RBC 4.23 4.22 - 5.81 MIL/uL   Hemoglobin 13.0 13.0 - 17.0 g/dL   HCT 40.0 39.0 - 52.0 %   MCV 94.6 80.0 - 100.0 fL   MCH 30.7 26.0 - 34.0 pg   MCHC 32.5 30.0 - 36.0 g/dL   RDW 13.9 11.5 - 15.5 %   Platelets 218 150 - 400 K/uL   nRBC 0.0 0.0 - 0.2 %   Neutrophils Relative % 59 %   Neutro Abs 4.3 1.7 - 7.7 K/uL   Lymphocytes Relative 31 %   Lymphs Abs 2.3 0.7 - 4.0 K/uL   Monocytes Relative 6 %   Monocytes Absolute 0.4 0.1 - 1.0 K/uL   Eosinophils Relative 4 %   Eosinophils Absolute 0.3 0.0 - 0.5 K/uL   Basophils Relative 0 %   Basophils Absolute 0.0 0.0 - 0.1 K/uL   Immature Granulocytes 0 %   Abs Immature  Granulocytes 0.02 0.00 - 0.07 K/uL    Comment: Performed at [facility], [place] [address] [place], [place] [address]   CT Renal Stone Study  Result Date: 03/21/2022 CLINICAL DATA:  Several hour history of left flank pain radiating into the abdomen with nausea and vomiting EXAM:  CT ABDOMEN AND PELVIS WITHOUT CONTRAST TECHNIQUE: Multidetector CT imaging of the abdomen and pelvis was performed following the standard protocol without IV contrast. RADIATION DOSE REDUCTION: This exam was performed according to the departmental dose-optimization program which includes automated exposure control, adjustment of the mA and/or kV according to patient size and/or use of iterative reconstruction technique. COMPARISON:  CT abdomen and pelvis dated 02/14/2022 FINDINGS: Lower chest: No focal consolidation or pulmonary nodule in the lung bases. No pleural effusion or pneumothorax demonstrated. Partially imaged heart size is normal. Hepatobiliary: No focal hepatic lesions. No intra or extrahepatic biliary ductal dilation. Normal gallbladder. Pancreas: No focal lesions or main ductal dilation. Spleen: Normal in size without focal abnormality. Adrenals/Urinary Tract: No adrenal nodules. Persistent 5 mm stone within the proximal left ureter. Additional stones are also seen within the distal ureter measuring up to 4 mm. Mild associated left hydroureteronephrosis with slightly asymmetric perinephric stranding. No right hydronephrosis or calculi. No focal bladder wall thickening. Stomach/Bowel: Normal appearance of the stomach. No evidence of bowel wall thickening, distention, or inflammatory changes. Normal appendix. Vascular/Lymphatic: Aortic atherosclerosis. No enlarged abdominal or pelvic lymph nodes. 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 Other: No free fluid, fluid collection, or free air. Musculoskeletal: No acute or abnormal lytic or blastic osseous lesions. 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 IMPRESSION: 1. New mild left hydroureteronephrosis with multiple left ureteral stones as described. 2.  Aortic Atherosclerosis (ICD10-I70.0). Electronically Signed   By: Darrin Nipper M.D.   On: 03/21/2022 18:21    Pending Labs [employer] (From admission, onward)     Start     Ordered   Signed and Held  Comprehensive metabolic panel  Tomorrow morning,   R        Signed and Held   Signed and Held  CBC with Differential/Platelet  Tomorrow morning,   R        Signed and Held            Vitals/Pain Today's Vitals   03/21/22 2000 03/21/22 2015 03/21/22 2027 03/21/22 2133  BP: 130/80 (!) 140/83 (!) 140/83   Pulse: 80 82 81   Resp:   18   Temp:   98.4 F (36.9 C)   TempSrc:      SpO2: 99% 99% 100%   Weight:      Height:      PainSc:    6     Isolation Precautions No active isolations  Medications Medications  prochlorperazine (COMPAZINE) injection 10 mg (has no administration in time range)  lactated ringers infusion (has no administration in time range)  morphine (PF) 4 MG/ML injection 4 mg (4 mg Intravenous Given 03/21/22 1754)  ondansetron (ZOFRAN) injection 4 mg (4 mg Intravenous Given 03/21/22 1754)  sodium chloride 0.9 % bolus 1,000 mL (1,000 mLs Intravenous New Bag/Given 03/21/22 1755)  HYDROmorphone (DILAUDID) injection 0.5 mg (0.5 mg Intravenous Given 03/21/22 1827)  HYDROmorphone (DILAUDID) injection 1 mg (1 mg Intravenous Given 03/21/22 1921)    Mobility walks     Focused Assessments    R Recommendations: See Admitting Provider Note  Report given to:   Additional Notes:

## 2022-03-21 NOTE — Assessment & Plan Note (Signed)
On crestor at home. Hold while NPO.

## 2022-03-21 NOTE — ED Notes (Signed)
Unable to pull medications on the patient at this time. Due to the pyxis being down

## 2022-03-22 ENCOUNTER — Encounter (HOSPITAL_COMMUNITY)
Admission: EM | Disposition: A | Discharge status: Home / Self Care | Payer: Self-pay | Source: Home / Self Care | Attending: Internal Medicine

## 2022-03-22 ENCOUNTER — Inpatient Hospital Stay (HOSPITAL_COMMUNITY): Payer: Medicare Other | Admitting: Anesthesiology

## 2022-03-22 ENCOUNTER — Encounter (HOSPITAL_COMMUNITY): Payer: Self-pay | Admitting: Internal Medicine

## 2022-03-22 ENCOUNTER — Inpatient Hospital Stay (HOSPITAL_COMMUNITY): Payer: Medicare Other

## 2022-03-22 DIAGNOSIS — Z8349 Family history of other endocrine, nutritional and metabolic diseases: Secondary | ICD-10-CM | POA: Diagnosis not present

## 2022-03-22 DIAGNOSIS — R109 Unspecified abdominal pain: Secondary | ICD-10-CM | POA: Diagnosis present

## 2022-03-22 DIAGNOSIS — Z79899 Other long term (current) drug therapy: Secondary | ICD-10-CM | POA: Diagnosis not present

## 2022-03-22 DIAGNOSIS — B9620 Unspecified Escherichia coli [E. coli] as the cause of diseases classified elsewhere: Secondary | ICD-10-CM | POA: Diagnosis present

## 2022-03-22 DIAGNOSIS — Z8546 Personal history of malignant neoplasm of prostate: Secondary | ICD-10-CM | POA: Diagnosis not present

## 2022-03-22 DIAGNOSIS — Z880 Allergy status to penicillin: Secondary | ICD-10-CM | POA: Diagnosis not present

## 2022-03-22 DIAGNOSIS — N23 Unspecified renal colic: Secondary | ICD-10-CM | POA: Diagnosis not present

## 2022-03-22 DIAGNOSIS — E7849 Other hyperlipidemia: Secondary | ICD-10-CM | POA: Diagnosis not present

## 2022-03-22 DIAGNOSIS — Z1612 Extended spectrum beta lactamase (ESBL) resistance: Secondary | ICD-10-CM | POA: Diagnosis present

## 2022-03-22 DIAGNOSIS — N3000 Acute cystitis without hematuria: Secondary | ICD-10-CM

## 2022-03-22 DIAGNOSIS — N179 Acute kidney failure, unspecified: Secondary | ICD-10-CM | POA: Diagnosis present

## 2022-03-22 DIAGNOSIS — Z886 Allergy status to analgesic agent status: Secondary | ICD-10-CM | POA: Diagnosis not present

## 2022-03-22 DIAGNOSIS — N132 Hydronephrosis with renal and ureteral calculous obstruction: Secondary | ICD-10-CM | POA: Diagnosis present

## 2022-03-22 DIAGNOSIS — Z923 Personal history of irradiation: Secondary | ICD-10-CM | POA: Diagnosis not present

## 2022-03-22 DIAGNOSIS — N201 Calculus of ureter: Secondary | ICD-10-CM | POA: Diagnosis not present

## 2022-03-22 DIAGNOSIS — E785 Hyperlipidemia, unspecified: Secondary | ICD-10-CM | POA: Diagnosis present

## 2022-03-22 DIAGNOSIS — N390 Urinary tract infection, site not specified: Secondary | ICD-10-CM | POA: Diagnosis present

## 2022-03-22 DIAGNOSIS — Z87442 Personal history of urinary calculi: Secondary | ICD-10-CM | POA: Diagnosis not present

## 2022-03-22 DIAGNOSIS — K5900 Constipation, unspecified: Secondary | ICD-10-CM | POA: Diagnosis not present

## 2022-03-22 DIAGNOSIS — Z8616 Personal history of COVID-19: Secondary | ICD-10-CM | POA: Diagnosis not present

## 2022-03-22 DIAGNOSIS — N136 Pyonephrosis: Secondary | ICD-10-CM | POA: Diagnosis present

## 2022-03-22 DIAGNOSIS — M1990 Unspecified osteoarthritis, unspecified site: Secondary | ICD-10-CM | POA: Diagnosis present

## 2022-03-22 HISTORY — PX: CYSTOSCOPY/URETEROSCOPY/HOLMIUM LASER/STENT PLACEMENT: SHX6546

## 2022-03-22 LAB — COMPREHENSIVE METABOLIC PANEL
ALT: 17 U/L (ref 0–44)
AST: 22 U/L (ref 15–41)
Albumin: 3.5 g/dL (ref 3.5–5.0)
Alkaline Phosphatase: 81 U/L (ref 38–126)
Anion gap: 8 (ref 5–15)
BUN: 28 mg/dL — ABNORMAL HIGH (ref 8–23)
CO2: 23 mmol/L (ref 22–32)
Calcium: 8.8 mg/dL — ABNORMAL LOW (ref 8.9–10.3)
Chloride: 106 mmol/L (ref 98–111)
Creatinine, Ser: 1.55 mg/dL — ABNORMAL HIGH (ref 0.61–1.24)
GFR, Estimated: 46 mL/min — ABNORMAL LOW (ref >60)
Glucose, Bld: 132 mg/dL — ABNORMAL HIGH (ref 70–99)
Potassium: 4.6 mmol/L (ref 3.5–5.1)
Sodium: 137 mmol/L (ref 135–145)
Total Bilirubin: 1 mg/dL (ref 0.3–1.2)
Total Protein: 6.8 g/dL (ref 6.5–8.1)

## 2022-03-22 LAB — CBC WITH DIFFERENTIAL/PLATELET
Abs Immature Granulocytes: 0.02 10*3/uL (ref 0.00–0.07)
Basophils Absolute: 0 10*3/uL (ref 0.0–0.1)
Basophils Relative: 0 %
Eosinophils Absolute: 0 10*3/uL (ref 0.0–0.5)
Eosinophils Relative: 0 %
HCT: 37.8 % — ABNORMAL LOW (ref 39.0–52.0)
Hemoglobin: 12.2 g/dL — ABNORMAL LOW (ref 13.0–17.0)
Immature Granulocytes: 0 %
Lymphocytes Relative: 20 %
Lymphs Abs: 1.5 10*3/uL (ref 0.7–4.0)
MCH: 30.5 pg (ref 26.0–34.0)
MCHC: 32.3 g/dL (ref 30.0–36.0)
MCV: 94.5 fL (ref 80.0–100.0)
Monocytes Absolute: 0.6 10*3/uL (ref 0.1–1.0)
Monocytes Relative: 8 %
Neutro Abs: 5.4 10*3/uL (ref 1.7–7.7)
Neutrophils Relative %: 72 %
Platelets: 180 10*3/uL (ref 150–400)
RBC: 4 MIL/uL — ABNORMAL LOW (ref 4.22–5.81)
RDW: 14 % (ref 11.5–15.5)
WBC: 7.5 10*3/uL (ref 4.0–10.5)
nRBC: 0 % (ref 0.0–0.2)

## 2022-03-22 SURGERY — CYSTOSCOPY/URETEROSCOPY/HOLMIUM LASER/STENT PLACEMENT
Anesthesia: General | Laterality: Left

## 2022-03-22 MED ORDER — IOHEXOL 300 MG/ML  SOLN
INTRAMUSCULAR | Status: DC | PRN
  Administered 2022-03-22: 10 mL
  Administered 2022-03-22: 6 mL

## 2022-03-22 MED ORDER — DEXAMETHASONE SODIUM PHOSPHATE 10 MG/ML IJ SOLN
INTRAMUSCULAR | Status: DC | PRN
  Administered 2022-03-22: 4 mg via INTRAVENOUS

## 2022-03-22 MED ORDER — ONDANSETRON HCL 4 MG/2ML IJ SOLN
INTRAMUSCULAR | Status: AC
  Filled 2022-03-22: qty 2

## 2022-03-22 MED ORDER — PHENYLEPHRINE 80 MCG/ML (10ML) SYRINGE FOR IV PUSH (FOR BLOOD PRESSURE SUPPORT)
PREFILLED_SYRINGE | INTRAVENOUS | Status: DC | PRN
  Administered 2022-03-22 (×2): 80 ug via INTRAVENOUS
  Administered 2022-03-22: 160 ug via INTRAVENOUS

## 2022-03-22 MED ORDER — OXYCODONE HCL 5 MG PO TABS: 5.0000 mg | ORAL_TABLET | Freq: Once | ORAL | Status: DC | PRN

## 2022-03-22 MED ORDER — ONDANSETRON HCL 4 MG/2ML IJ SOLN
INTRAMUSCULAR | Status: DC | PRN
  Administered 2022-03-22: 4 mg via INTRAVENOUS

## 2022-03-22 MED ORDER — EPHEDRINE SULFATE-NACL 50-0.9 MG/10ML-% IV SOSY
PREFILLED_SYRINGE | INTRAVENOUS | Status: DC | PRN
  Administered 2022-03-22: 10 mg via INTRAVENOUS

## 2022-03-22 MED ORDER — LIDOCAINE 2% (20 MG/ML) 5 ML SYRINGE
INTRAMUSCULAR | Status: DC | PRN
  Administered 2022-03-22: 60 mg via INTRAVENOUS

## 2022-03-22 MED ORDER — LACTATED RINGERS IV SOLN: INTRAVENOUS | Status: DC

## 2022-03-22 MED ORDER — CHLORHEXIDINE GLUCONATE 0.12 % MT SOLN: 15.0000 mL | Freq: Once | OROMUCOSAL | Status: DC

## 2022-03-22 MED ORDER — LIDOCAINE HCL (PF) 2 % IJ SOLN
INTRAMUSCULAR | Status: AC
  Filled 2022-03-22: qty 5

## 2022-03-22 MED ORDER — DEXAMETHASONE SODIUM PHOSPHATE 10 MG/ML IJ SOLN
INTRAMUSCULAR | Status: AC
  Filled 2022-03-22: qty 1

## 2022-03-22 MED ORDER — ACETAMINOPHEN 500 MG PO TABS
1000.0000 mg | ORAL_TABLET | Freq: Once | ORAL | Status: AC
  Administered 2022-03-22: 1000 mg via ORAL

## 2022-03-22 MED ORDER — PHENYLEPHRINE 80 MCG/ML (10ML) SYRINGE FOR IV PUSH (FOR BLOOD PRESSURE SUPPORT)
PREFILLED_SYRINGE | INTRAVENOUS | Status: AC
  Filled 2022-03-22: qty 10

## 2022-03-22 MED ORDER — HYDROMORPHONE HCL 1 MG/ML IJ SOLN: 0.2500 mg | INTRAMUSCULAR | Status: DC | PRN

## 2022-03-22 MED ORDER — LACTATED RINGERS IV SOLN: Freq: Once | INTRAVENOUS | Status: AC

## 2022-03-22 MED ORDER — ACETAMINOPHEN 500 MG PO TABS
ORAL_TABLET | ORAL | Status: AC
  Filled 2022-03-22: qty 2

## 2022-03-22 MED ORDER — ORAL CARE MOUTH RINSE: 15.0000 mL | Freq: Once | OROMUCOSAL | Status: DC

## 2022-03-22 MED ORDER — ONDANSETRON HCL 4 MG/2ML IJ SOLN: 4.0000 mg | Freq: Once | INTRAMUSCULAR | Status: DC | PRN

## 2022-03-22 MED ORDER — PROPOFOL 10 MG/ML IV BOLUS
INTRAVENOUS | Status: AC
  Filled 2022-03-22: qty 20

## 2022-03-22 MED ORDER — CHLORHEXIDINE GLUCONATE 0.12 % MT SOLN
15.0000 mL | Freq: Once | OROMUCOSAL | Status: AC
  Administered 2022-03-22: 15 mL via OROMUCOSAL

## 2022-03-22 MED ORDER — PROPOFOL 10 MG/ML IV BOLUS
INTRAVENOUS | Status: DC | PRN
  Administered 2022-03-22: 150 mg via INTRAVENOUS

## 2022-03-22 MED ORDER — OXYCODONE HCL 5 MG/5ML PO SOLN: 5.0000 mg | Freq: Once | ORAL | Status: DC | PRN

## 2022-03-22 MED ORDER — FENTANYL CITRATE (PF) 250 MCG/5ML IJ SOLN
INTRAMUSCULAR | Status: DC | PRN
  Administered 2022-03-22: 50 ug via INTRAVENOUS

## 2022-03-22 MED ORDER — SODIUM CHLORIDE 0.9 % IR SOLN
Status: DC | PRN
  Administered 2022-03-22: 3000 mL

## 2022-03-22 MED ORDER — SODIUM CHLORIDE 0.9 % IV SOLN
1.0000 g | Freq: Two times a day (BID) | INTRAVENOUS | Status: DC
  Administered 2022-03-22 – 2022-03-23 (×2): 1 g via INTRAVENOUS
  Filled 2022-03-22 (×3): qty 20

## 2022-03-22 MED ORDER — FENTANYL CITRATE (PF) 100 MCG/2ML IJ SOLN
INTRAMUSCULAR | Status: AC
  Filled 2022-03-22: qty 2

## 2022-03-22 MED ORDER — EPHEDRINE 5 MG/ML INJ
INTRAVENOUS | Status: AC
  Filled 2022-03-22: qty 5

## 2022-03-22 SURGICAL SUPPLY — 24 items
BAG URO CATCHER STRL LF (MISCELLANEOUS) ×2 IMPLANT
BASKET LASER NITINOL 1.9FR (BASKET) IMPLANT
BSKT STON RTRVL 120 1.9FR (BASKET) ×1
CATH URETL OPEN END 6FR 70 (CATHETERS) ×2 IMPLANT
CATH UROLOGY TORQUE 40 (MISCELLANEOUS) IMPLANT
CLOTH BEACON ORANGE TIMEOUT ST (SAFETY) ×2 IMPLANT
EXTRACTOR STONE 1.7FRX115CM (UROLOGICAL SUPPLIES) IMPLANT
GLOVE SURG LX STRL 7.5 STRW (GLOVE) ×2 IMPLANT
GOWN STRL REUS W/ TWL XL LVL3 (GOWN DISPOSABLE) ×2 IMPLANT
GOWN STRL REUS W/TWL XL LVL3 (GOWN DISPOSABLE) ×1
GUIDEWIRE ANG ZIPWIRE 038X150 (WIRE) ×2 IMPLANT
GUIDEWIRE STR DUAL SENSOR (WIRE) ×2 IMPLANT
KIT TURNOVER KIT A (KITS) IMPLANT
LASER FIB FLEXIVA PULSE ID 365 (Laser) IMPLANT
MANIFOLD NEPTUNE II (INSTRUMENTS) ×2 IMPLANT
PACK CYSTO (CUSTOM PROCEDURE TRAY) ×2 IMPLANT
SHEATH NAVIGATOR HD 11/13X28 (SHEATH) IMPLANT
SHEATH NAVIGATOR HD 11/13X36 (SHEATH) IMPLANT
STENT POLARIS 5FRX24 (STENTS) IMPLANT
TRACTIP FLEXIVA PULS ID 200XHI (Laser) IMPLANT
TRACTIP FLEXIVA PULSE ID 200 (Laser) ×2
TUBE PU 8FR 16IN ENFIT (TUBING) ×2 IMPLANT
TUBING CONNECTING 10 (TUBING) ×2 IMPLANT
TUBING UROLOGY SET (TUBING) ×2 IMPLANT

## 2022-03-22 NOTE — Brief Op Note (Signed)
03/22/2022  5:21 PM  PATIENT:  Shawn Walsh  77 y.o. male  PRE-OPERATIVE DIAGNOSIS:  left stone  POST-OPERATIVE DIAGNOSIS:  left stone  PROCEDURE:  Procedure(s): CYSTOSCOPY/ LEFT RETROGRADE/URETEROSCOPY/HOLMIUM LASER/STENT PLACEMENT (Left)  SURGEON:  Surgeon(s) and Role:    * [name], MD - Primary  PHYSICIAN ASSISTANT:   ASSISTANTS: none   ANESTHESIA:   general  EBL:  minimal   BLOOD ADMINISTERED:none  DRAINS: none   LOCAL MEDICATIONS USED:  NONE  SPECIMEN:  Source of Specimen:  Left ureteral stone fragments  DISPOSITION OF SPECIMEN:   [facility] for compositional analysis  COUNTS:  YES  TOURNIQUET:  * No tourniquets in log *  DICTATION: .Other Dictation: Dictation Number *[id number]  PLAN OF CARE: Admit to inpatient   PATIENT DISPOSITION:  PACU - hemodynamically stable.   Delay start of Pharmacological VTE agent (>24hrs) due to surgical blood loss or risk of bleeding: yes

## 2022-03-22 NOTE — Anesthesia Preprocedure Evaluation (Addendum)
Anesthesia Evaluation  Patient identified by MRN, date of birth, ID band Patient awake    Reviewed: Allergy & Precautions, NPO status , Patient's Chart, lab work & pertinent test results  Airway Mallampati: IV  TM Distance: >3 FB Neck ROM: Full    Dental  (+) Teeth Intact, Dental Advisory Given   Pulmonary neg pulmonary ROS   Pulmonary exam normal breath sounds clear to auscultation       Cardiovascular negative cardio ROS Normal cardiovascular exam Rhythm:Regular Rate:Normal     Neuro/Psych negative neurological ROS  negative psych ROS   GI/Hepatic Neg liver ROS,GERD  Medicated and Controlled,,  Endo/Other  negative endocrine ROS    Renal/GU Renal InsufficiencyRenal disease  negative genitourinary   Musculoskeletal  (+) Arthritis , Osteoarthritis,    Abdominal   Peds  Hematology negative hematology ROS (+)   Anesthesia Other Findings   Reproductive/Obstetrics negative OB ROS                              Anesthesia Physical Anesthesia Plan  ASA: 2  Anesthesia Plan: General   Post-op Pain Management: Tylenol PO (pre-op)*   Induction: Intravenous  PONV Risk Score and Plan: 3 and Ondansetron and Treatment may vary due to age or medical condition  Airway Management Planned: LMA  Additional Equipment: None  Intra-op Plan:   Post-operative Plan: Extubation in OR  Informed Consent: I have reviewed the patients History and Physical, chart, labs and discussed the procedure including the risks, benefits and alternatives for the proposed anesthesia with the patient or authorized representative who has indicated his/her understanding and acceptance.     Dental advisory given  Plan Discussed with: CRNA  Anesthesia Plan Comments:          Anesthesia Quick Evaluation

## 2022-03-22 NOTE — Transfer of Care (Signed)
Immediate Anesthesia Transfer of Care Note  Patient: Shawn Walsh  Procedure(s) Performed: CYSTOSCOPY/ LEFT RETROGRADE/URETEROSCOPY/HOLMIUM LASER/STENT PLACEMENT (Left)  Patient Location: PACU  Anesthesia Type:General  Level of Consciousness: drowsy  Airway & Oxygen Therapy: Patient Spontanous Breathing and Patient connected to face mask oxygen  Post-op Assessment: Report given to RN, Post -op Vital signs reviewed and stable, and Patient moving all extremities X 4  Post vital signs: Reviewed and stable  Last Vitals:  Vitals Value Taken Time  BP 115/64   Temp    Pulse 67   Resp 12   SpO2 99     Last Pain:  Vitals:   03/22/22 1451  TempSrc: Oral  PainSc: 0-No pain      Patients Stated Pain Goal: 0 ([id number])  Complications: No notable events documented.

## 2022-03-22 NOTE — Progress Notes (Signed)
Pharmacy Antibiotic Note  Shawn Walsh is a 77 y.o. male admitted on 03/21/2022 with  ESBL Ecoli UTI .  Pharmacy has been consulted for Merrem dosing.  Active Problem(s): flank pain   He was scheduled for shockwave lithotripsy on 03/13/2022 but this was canceled mid procedure due to possibility the patient taking ibuprofen. Patient states they did not have any procedure done. Operative note states that he had partial procedure performed  CT urogram shows a 5 mm stone within the proximal left ureter and in distal ureter measuring 4 mm. Mild left hydroureteronephrosis with slightly asymmetric Perry nephric stranding.   PMH: arthritis, COVID 2/22, prostate cancer kidney stones, hyperlipidemia   ID: Ureterolithiasis. Urine culture done by urology office is growing ESBL E. coli.  - Afebrile, WBC 7.5, Scr 1.55  Plan: Merrem 1g IV q 12 hrs    Height: 5\' 8"  (172.7 cm) Weight: 70.1 kg (154 lb 8.7 oz) IBW/kg (Calculated) : 68.4  Temp (24hrs), Avg:98.5 F (36.9 C), Min:98.1 F (36.7 C), Max:98.9 F (37.2 C)  Recent Labs  Lab 03/21/22 1723 03/22/22 0355  WBC 7.4 7.5  CREATININE 1.36* 1.55*    Estimated Creatinine Clearance: 39.2 mL/min (A) (by C-G formula based on SCr of 1.55 mg/dL (H)).    Allergies  Allergen Reactions   Amoxicillin     Other reaction(s): vomiting   Amoxicillin-Pot Clavulanate Nausea And Vomiting   Moxifloxacin Nausea And Vomiting   Nsaids     Other reaction(s): elevated kidney  test    Alysson Geist S. Alford Highland, PharmD, BCPS Clinical Staff Pharmacist [email or web address]  Wayland Salinas 03/22/2022 11:34 AM

## 2022-03-22 NOTE — Op Note (Signed)
Shawn Walsh, Shawn Walsh MEDICAL RECORD NO: [id number] ACCOUNT NO: [id number] DATE OF BIRTH: 04-25-1945 FACILITY: [facility] LOCATION: [facility] PHYSICIAN: [name], MD  Operative Report   DATE OF PROCEDURE: 03/22/2022  PREOPERATIVE DIAGNOSIS:  Retained primary residual left ureteral fragments with refractory colic after shockwave lithotripsy.  PROCEDURE PERFORMED: 1.  Cystoscopy with bilateral retrograde pyelogram interpretation. 2.  Left ureteroscopy with laser lithotripsy, second stage. 3.  Insertion of left ureteral stent.  ESTIMATED BLOOD LOSS:  Nil.  COMPLICATIONS:  None.  SPECIMEN:  Left ureteral and renal stone fragments for composition analysis.  FINDINGS: 1.  Multifocal left distal ureteral stones approximately 3 mm x 3, larger left proximal ureteral stones approximately 6 mm. 2.  Complete resolution of all accessible stone fragments larger than one-third mm following laser lithotripsy and basket extraction. 3.  Successful placement of left ureteral stent, proximal end in renal pelvis, distal end in urinary bladder, with tether.  INDICATIONS:  The patient is a 77 year old man with history of prostate cancer, status post brachytherapy.  He was found to have a large left proximal ureteral stone.  He underwent shockwave lithotripsy on 03/13/2022, which did result in significant  fragmentation of the stone.  However, he has had problematic colic passing fragments and axial  imaging yesterday, which revealed several small distal ureteral stones as well as some larger volume proximal ureteral stones.  Options were discussed including  medical therapy versus stenting versus definitive ureteroscopy.  He was admitted overnight for trial of medical therapy, however, upon reevaluation this afternoon his colic was still difficult to control.  Options were once again discussed.  He wished to  proceed with definitive ureteroscopy.  Initially, this is a second stage procedure into his first stage  lithotripsy.  Informed consent was obtained and placed in medical record.  PROCEDURE DETAILS:  The patient being Shawn Walsh identified and procedure being left ureteroscopic stone manipulation was confirmed.  Procedure timeout was performed.  Intravenous antibiotics were administered.  General LMA anesthesia induced.  The  patient was placed into a low lithotomy position.  Sterile field was created, prepped and draped the patient's penis, perineum, and proximal thighs using iodine.  Cystourethroscopy was performed using 21-French rigid cystoscope with offset lens.   Inspection of anterior and posterior urethra was unremarkable.  Inspection of urinary bladder revealed no diverticula, calcifications, papillary lesions.  Left ureteral orifice was cannulated with 6-French open-ended catheter, and a left retrograde  pyelogram was obtained.  Left retrograde pyelogram demonstrated a single left ureter, single system left kidney, multifocal filling defects in distal ureter and proximal ureter consistent with known stone.  There was mild hydronephrosis, 0.038 ZIPwire was advanced to the level  of the upper poles as a safety wire.  An 8-French feeding tube placed in the urinary bladder for pressure release and semirigid ureteroscopy performed to the distal left ureter alongside a separate sensor working wire.  As anticipated, there is  multifocal stones in the ureter just above the intramural ureter approximately 3 mm x 3.  These were amenable to simple basketing.  They were sequentially grasped on the long axis and placed in the urinary bladder for later retrieval.  Semirigid  ureteroscopy of the remainder of the distal 2/3 left ureter revealed no additional calcifications.  The semirigid scope was then exchanged over a sensor working wire for a medium length ureteral access sheath to the level of proximal ureter using  continuous fluoroscopic guidance and flexible digital ureteroscopy was performed of the  proximal left ureter.  There were two dominant calcifications in the upper ureter that were ball-valving.  These were too large for simple basketing.  They were  repositioned into a midpole calix.  Holmium laser energy applied to stone using 0.2 joules 20 Hz.  Each stone was fragmented into smaller pieces that were then amenable to simple basketing, then removed, set aside for composition analysis.  Repeat  inspection of the left kidney revealed complete resolution of all accessible stone fragments larger than one-third mm.  Access sheath was removed under continuous vision, no significant mucosal abnormalities found.  The cystoscope was then used again to  retrieve the stones that had been previously placed in the bladder.  They were all collected.  Given his recent colic, it was felt that interval stenting with tethered stent would be most prudent.  As such, a new 5 x 24 Polaris type stent was placed over  the safety wire using fluoroscopic guidance.  Tether was left in place and fashioned to the dorsum of penis.  The procedure was terminated.  The patient tolerated procedure well, no immediate perioperative complications.  The patient was taken to  postanesthesia care unit in stable condition.  He likely be remain on the hospitalist service overnight, likely discharge tomorrow pending his colic is satisfactory.   SHY D: 03/22/2022 5:27:59 pm T: 03/22/2022 8:45:00 pm  JOB: [id number]

## 2022-03-22 NOTE — Progress Notes (Signed)
[facility]                                                                              Shawn Walsh, is a 77 y.o. male, DOB - 04/30/1945, [id number] Admit date - 03/21/2022    Outpatient Primary MD for the patient is Shawn Walsh, Shawn Luo, MD  LOS - 0  days  Chief Complaint  Patient presents with   Flank Pain       Brief summary   Patient is a 77 year old male with history of renal stones, hyperlipidemia presented to ED with worsening pain, nausea and vomiting.  He was initially scheduled to have shockwave lithotripsy on 3/11 but was canceled mid procedure due to patient taking NSAIDs/ibuprofen.  Operative note states he had partial procedure performed. Patient presented to ER with complaints of left flank pain, radiating onto the abdomen with intractable nausea and vomiting with no significant relief with oxycodone. Vital signs stable in ED, Labs showed BUN 30, creatinine 1.36.  Creatinine was 1.07 on 02/14/2022.  CT renal stone study showed new mild left hydro utero nephrosis with multiple left ureteral stones  Of note, patient was seen by Dr. Link Snuffer last week on call and had sent a culture which grew esbl E. coli sensitive to cefotetan, meropenem, macrobid, gentamicin, zosyn, tobramycin   Assessment & Plan    Principal Problem: Acute abdominal pain secondary to ureterolithiasis, new mild left hydroureteronephrosis -Urology consulted, discussed with Dr. Felipa Eth, will keep n.p.o., IV fluids, pain control, possibly will require stent, will defer to urology for further management -I was contacted by Dr. Gloriann Loan that patient's urine culture grew ESBL E. Coli.  Will start patient on IV meropenem.  Active Problems:   AKI (acute kidney injury) ([place]) -Creatinine worsened to 1.5 today, baseline 1.0-1.1 -Likely due to #1, increase IV fluid hydration to 125 cc an hour     HLD (hyperlipidemia) -On Crestor at home, hold while n.p.o.   ESBL E. coli UTI -See  #1, will place on IV meropenem  Code Status: Full code DVT Prophylaxis:  SCDs Start: 03/21/22 2240   Level of Care: Level of care: Med-Surg Family Communication: Updated patient Disposition Plan:      Remains inpatient appropriate:    Procedures:    Consultants:   Urology  Antimicrobials:   Anti-infectives (From admission, onward)    None       Medications     Subjective:   Shawn Walsh was seen and examined today.  Complaining of left flank pain, constant, 4-5/10, no fevers or chills, nausea or vomiting.  No chest pain.   No acute events overnight.    Objective:   Vitals:   03/21/22 2200 03/21/22 2252 03/22/22 0500 03/22/22 0626  BP: (!) 144/84 135/86  134/76  Pulse: 83 83  85  Resp:  19  19  Temp:  98.1 F (36.7 C)  98.8 F (37.1 C)  TempSrc:  Oral  Oral  SpO2: 93% 98%  98%  Weight:   70.1 kg   Height:        Intake/Output Summary (Last 24 hours) at 03/22/2022 1121 Last data filed at 03/21/2022  2212 Gross per 24 hour  Intake 999 ml  Output --  Net 999 ml     Wt Readings from Last 3 Encounters:  03/22/22 70.1 kg  03/13/22 68.8 kg  02/14/22 68.5 kg     Exam General: Alert and oriented x 3, NAD Cardiovascular: S1 S2 auscultated,  RRR Respiratory: Clear to auscultation bilaterally, no wheezing Gastrointestinal: Soft, nontender, nondistended, + bowel sounds, left flank/CVAT+ Ext: no pedal edema bilaterally Neuro: no new deficits, ambulating without difficulty Psych: Normal affect     Data Reviewed:  I have personally reviewed following labs    CBC Lab Results  Component Value Date   WBC 7.5 03/22/2022   RBC 4.00 (L) 03/22/2022   HGB 12.2 (L) 03/22/2022   HCT 37.8 (L) 03/22/2022   MCV 94.5 03/22/2022   MCH 30.5 03/22/2022   PLT 180 03/22/2022   MCHC 32.3 03/22/2022   RDW 14.0 03/22/2022   LYMPHSABS 1.5 03/22/2022   MONOABS 0.6 03/22/2022   EOSABS 0.0 03/22/2022   BASOSABS 0.0 [id number]     Last metabolic panel Lab  Results  Component Value Date   NA 137 03/22/2022   K 4.6 03/22/2022   CL 106 03/22/2022   CO2 23 03/22/2022   BUN 28 (H) 03/22/2022   CREATININE 1.55 (H) 03/22/2022   GLUCOSE 132 (H) 03/22/2022   GFRNONAA 46 (L) 03/22/2022   GFRAA >60 05/09/2019   CALCIUM 8.8 (L) 03/22/2022   PROT 6.8 03/22/2022   ALBUMIN 3.5 03/22/2022   BILITOT 1.0 03/22/2022   ALKPHOS 81 03/22/2022   AST 22 03/22/2022   ALT 17 03/22/2022   ANIONGAP 8 03/22/2022    CBG (last 3)  No results for input(s): "GLUCAP" in the last 72 hours.    Coagulation Profile: No results for input(s): "INR", "PROTIME" in the last 168 hours.   Radiology Studies: I have personally reviewed the imaging studies  CT Renal Stone Study  Result Date: 03/21/2022 CLINICAL DATA:  Several hour history of left flank pain radiating into the abdomen with nausea and vomiting EXAM: CT ABDOMEN AND PELVIS WITHOUT CONTRAST TECHNIQUE: Multidetector CT imaging of the abdomen and pelvis was performed following the standard protocol without IV contrast. RADIATION DOSE REDUCTION: This exam was performed according to the departmental dose-optimization program which includes automated exposure control, adjustment of the mA and/or kV according to patient size and/or use of iterative reconstruction technique. COMPARISON:  CT abdomen and pelvis dated 02/14/2022 FINDINGS: Lower chest: No focal consolidation or pulmonary nodule in the lung bases. No pleural effusion or pneumothorax demonstrated. Partially imaged heart size is normal. Hepatobiliary: No focal hepatic lesions. No intra or extrahepatic biliary ductal dilation. Normal gallbladder. Pancreas: No focal lesions or main ductal dilation. Spleen: Normal in size without focal abnormality. Adrenals/Urinary Tract: No adrenal nodules. Persistent 5 mm stone within the proximal left ureter. Additional stones are also seen within the distal ureter measuring up to 4 mm. Mild associated left hydroureteronephrosis with  slightly asymmetric perinephric stranding. No right hydronephrosis or calculi. No focal bladder wall thickening. Stomach/Bowel: Normal appearance of the stomach. No evidence of bowel wall thickening, distention, or inflammatory changes. Normal appendix. Vascular/Lymphatic: Aortic atherosclerosis. No enlarged abdominal or pelvic lymph nodes. 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 Other: No free fluid, fluid collection, or free air. Musculoskeletal: No acute or abnormal lytic or blastic osseous lesions. 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 IMPRESSION: 1. New mild left hydroureteronephrosis with multiple left ureteral stones as described. 2.  Aortic Atherosclerosis (ICD10-I70.0). Electronically Signed   By: Darrin Nipper M.D.   On: 03/21/2022 18:21       Tabathia Knoche M.D. [facility] 03/22/2022, 11:21 AM  Available via Epic secure chat 7am-7pm After 7 pm, please refer to night coverage provider listed on amion.

## 2022-03-22 NOTE — Anesthesia Procedure Notes (Signed)
Procedure Name: LMA Insertion Date/Time: 03/22/2022 4:31 PM  Performed by: Jenne Campus, CRNAPre-anesthesia Checklist: Patient identified, Emergency Drugs available, Suction available and Patient being monitored Patient Re-evaluated:Patient Re-evaluated prior to induction Oxygen Delivery Method: Circle System Utilized Preoxygenation: Pre-oxygenation with 100% oxygen Induction Type: IV induction Ventilation: Mask ventilation without difficulty LMA: LMA inserted LMA Size: 4.0 Number of attempts: 1 Placement Confirmation: positive ETCO2 and breath sounds checked- equal and bilateral Tube secured with: Tape Dental Injury: Teeth and Oropharynx as per pre-operative assessment

## 2022-03-22 NOTE — Anesthesia Postprocedure Evaluation (Signed)
Anesthesia Post Note  Patient: Shawn Walsh  Procedure(s) Performed: CYSTOSCOPY/ LEFT RETROGRADE/URETEROSCOPY/HOLMIUM LASER/STENT PLACEMENT (Left)     Patient location during evaluation: PACU Anesthesia Type: General Level of consciousness: awake and alert, oriented and patient cooperative Pain management: pain level controlled Vital Signs Assessment: post-procedure vital signs reviewed and stable Respiratory status: spontaneous breathing, nonlabored ventilation and respiratory function stable Cardiovascular status: blood pressure returned to baseline and stable Postop Assessment: no apparent nausea or vomiting Anesthetic complications: no   No notable events documented.  Last Vitals:  Vitals:   03/22/22 1852 03/22/22 1926  BP: 112/76 109/73  Pulse: 65 64  Resp: 16 19  Temp: (!) 36.4 C (!) 36.4 C  SpO2: 96% 98%    Last Pain:  Vitals:   03/22/22 1926  TempSrc: Oral  PainSc:                  Pervis Hocking

## 2022-03-22 NOTE — Consult Note (Signed)
Reason for Consult: Left Renal colic After Shockwave Lithotripsy  Referring Physician: Loraine Leriche MD  Shawn Walsh is an 77 y.o. male.   HPI:   1 - Left Renal colic After Shockwave Lithotripsy - s/p LEFT SWl 03/13/22 for 64mm proximal stone. Significant falnk pain prompting ER CT 3/19 with multifocal ureteral fragemtns and pian difficult to control. Admitted for pain control but still problematic 3/20.   Today "Shawn Walsh" is seen in consultation for above. He is still having problematic colic. 66mm upper and 41mm lower x 2 uretral fragments. No interval fevers. WBC 7's.  He is NPO.   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, Stage T1, Gleason 4+5   Wears glasses     Past Surgical History:  Procedure Laterality Date   ABDOMINAL ADHESION SURGERY  1976   [place]   APPENDECTOMY  1972   COLONOSCOPY  01/06/2002   normal   COLONOSCOPY  01/06/2002   CYSTOSCOPY N/A 05/13/2019   Procedure: CYSTOSCOPY FLEXIBLE;  Surgeon: Festus Aloe, MD;  Location[facility];  Service: Urology;  Laterality: N/A;  NO SEEDS FOUND IN BLADDER   EXTRACORPOREAL SHOCK WAVE LITHOTRIPSY Left 03/13/2022   Procedure: LEFT EXTRACORPOREAL SHOCK WAVE LITHOTRIPSY (ESWL);  Surgeon: Franchot Gallo, MD;  Location[facility];  Service: Urology;  Laterality: Left;  75 MINUTES   RADIOACTIVE SEED IMPLANT N/A 05/13/2019   Procedure: RADIOACTIVE SEED IMPLANT/BRACHYTHERAPY IMPLANT;  Surgeon: Festus Aloe, MD;  Location: [facility];  Service: Urology;  Laterality: N/A;   65  SEEDS IMPLANTED   SPACE OAR INSTILLATION N/A 05/13/2019   Procedure: SPACE OAR INSTILLATION;  Surgeon: Festus Aloe, MD;  Location: [facility];  Service: Urology;  Laterality: N/A;    Family History  Problem Relation Age of Onset   Hyperlipidemia Brother    Colon cancer Neg Hx    Prostate cancer Neg Hx    Pancreatic cancer Neg Hx    Breast cancer Neg Hx    Colon polyps Neg Hx    Esophageal cancer Neg Hx     Social History:  reports that he has never smoked. He has never used smokeless tobacco. He reports that he does not drink alcohol and does not use drugs.  Allergies:  Allergies  Allergen Reactions   Amoxicillin     Other reaction(s): vomiting   Amoxicillin-Pot Clavulanate Nausea And Vomiting   Moxifloxacin Nausea And Vomiting   Nsaids     Other reaction(s): elevated kidney  test    Medications: I have reviewed the patient's current medications.  Results for orders placed or performed during the hospital encounter of 03/21/22 (from the past 48 hour(s))  Urinalysis, w/ Reflex to Culture (Infection Suspected) -Urine, Clean Catch     Status: Abnormal   Collection Time: 03/21/22  5:21 PM  Result Value Ref Range   Specimen Source URINE, CLEAN CATCH    Color, Urine AMBER (A) YELLOW    Comment: BIOCHEMICALS MAY BE AFFECTED BY COLOR   APPearance CLOUDY (A) CLEAR   Specific Gravity, Urine 1.027 1.005 - 1.030   pH 5.0 5.0 - 8.0   Glucose, UA NEGATIVE NEGATIVE mg/dL  Hgb urine dipstick NEGATIVE NEGATIVE   Bilirubin Urine NEGATIVE NEGATIVE   Ketones, ur 5 (A) NEGATIVE mg/dL   Protein, ur NEGATIVE NEGATIVE mg/dL   Nitrite NEGATIVE NEGATIVE   Leukocytes,Ua NEGATIVE NEGATIVE   RBC / HPF 6-10 0 - 5 RBC/hpf   WBC, UA 0-5 0 - 5 WBC/hpf    Comment:        Reflex urine culture not performed if WBC <=10, OR if Squamous epithelial cells >5. If Squamous epithelial cells >5 suggest recollection.    Bacteria, UA NONE SEEN NONE SEEN   Squamous Epithelial / HPF 0-5 0 - 5 /HPF   Mucus PRESENT    Ca Oxalate Crys, UA PRESENT     Comment: Performed at [facility], [place] [address],  [place], [place] [id number]  Basic metabolic panel     Status: Abnormal   Collection Time: 03/21/22  5:23 PM  Result Value Ref Range   Sodium 138 135 - 145 mmol/L   Potassium 3.8 3.5 - 5.1 mmol/L   Chloride 104 98 - 111 mmol/L   CO2 24 22 - 32 mmol/L   Glucose, Bld 125 (H) 70 - 99 mg/dL    Comment: Glucose reference range applies only to samples taken after fasting for at least 8 hours.   BUN 30 (H) 8 - 23 mg/dL   Creatinine, Ser 1.36 (H) 0.61 - 1.24 mg/dL   Calcium 9.1 8.9 - 10.3 mg/dL   GFR, Estimated 54 (L) >60 mL/min    Comment: (NOTE) Calculated using the CKD-EPI Creatinine Equation (2021)    Anion gap 10 5 - 15    Comment: Performed at [facility], [place] [address] [place], [place] [address]  CBC with Differential     Status: None   Collection Time: 03/21/22  5:23 PM  Result Value Ref Range   WBC 7.4 4.0 - 10.5 K/uL   RBC 4.23 4.22 - 5.81 MIL/uL   Hemoglobin 13.0 13.0 - 17.0 g/dL   HCT 40.0 39.0 - 52.0 %   MCV 94.6 80.0 - 100.0 fL   MCH 30.7 26.0 - 34.0 pg   MCHC 32.5 30.0 - 36.0 g/dL   RDW 13.9 11.5 - 15.5 %   Platelets 218 150 - 400 K/uL   nRBC 0.0 0.0 - 0.2 %   Neutrophils Relative % 59 %   Neutro Abs 4.3 1.7 - 7.7 K/uL   Lymphocytes Relative 31 %   Lymphs Abs 2.3 0.7 - 4.0 K/uL   Monocytes Relative 6 %   Monocytes Absolute 0.4 0.1 - 1.0 K/uL   Eosinophils Relative 4 %   Eosinophils Absolute 0.3 0.0 - 0.5 K/uL   Basophils Relative 0 %   Basophils Absolute 0.0 0.0 - 0.1 K/uL   Immature Granulocytes 0 %   Abs Immature Granulocytes 0.02 0.00 - 0.07 K/uL    Comment: Performed at [facility], [place] [address], [place], [place] [address]  Comprehensive metabolic panel     Status: Abnormal   Collection Time: 03/22/22  3:55 AM  Result Value Ref Range   Sodium 137 135 - 145 mmol/L   Potassium 4.6 3.5 - 5.1 mmol/L   Chloride 106 98 - 111 mmol/L   CO2 23 22 - 32 mmol/L   Glucose, Bld 132 (H) 70 - 99 mg/dL    Comment: Glucose reference  range applies only to samples taken after fasting for at least 8 hours.   BUN 28 (H) 8 - 23  mg/dL   Creatinine, Ser 1.55 (H) 0.61 - 1.24 mg/dL   Calcium 8.8 (L) 8.9 - 10.3 mg/dL   Total Protein 6.8 6.5 - 8.1 g/dL   Albumin 3.5 3.5 - 5.0 g/dL   AST 22 15 - 41 U/L   ALT 17 0 - 44 U/L   Alkaline Phosphatase 81 38 - 126 U/L   Total Bilirubin 1.0 0.3 - 1.2 mg/dL   GFR, Estimated 46 (L) >60 mL/min    Comment: (NOTE) Calculated using the CKD-EPI Creatinine Equation (2021)    Anion gap 8 5 - 15    Comment: Performed at [facility], [place] [address] [place], [place] [address]  CBC with Differential/Platelet     Status: Abnormal   Collection Time: 03/22/22  3:55 AM  Result Value Ref Range   WBC 7.5 4.0 - 10.5 K/uL   RBC 4.00 (L) 4.22 - 5.81 MIL/uL   Hemoglobin 12.2 (L) 13.0 - 17.0 g/dL   HCT 37.8 (L) 39.0 - 52.0 %   MCV 94.5 80.0 - 100.0 fL   MCH 30.5 26.0 - 34.0 pg   MCHC 32.3 30.0 - 36.0 g/dL   RDW 14.0 11.5 - 15.5 %   Platelets 180 150 - 400 K/uL   nRBC 0.0 0.0 - 0.2 %   Neutrophils Relative % 72 %   Neutro Abs 5.4 1.7 - 7.7 K/uL   Lymphocytes Relative 20 %   Lymphs Abs 1.5 0.7 - 4.0 K/uL   Monocytes Relative 8 %   Monocytes Absolute 0.6 0.1 - 1.0 K/uL   Eosinophils Relative 0 %   Eosinophils Absolute 0.0 0.0 - 0.5 K/uL   Basophils Relative 0 %   Basophils Absolute 0.0 0.0 - 0.1 K/uL   Immature Granulocytes 0 %   Abs Immature Granulocytes 0.02 0.00 - 0.07 K/uL    Comment: Performed at [facility], [place] [address], [place], [place] [address]    CT Renal Stone Study  Result Date: 03/21/2022 CLINICAL DATA:  Several hour history of left flank pain radiating into the abdomen with nausea and vomiting EXAM: CT ABDOMEN AND PELVIS WITHOUT CONTRAST TECHNIQUE: Multidetector CT imaging of the abdomen and pelvis was performed following the standard protocol without IV contrast. RADIATION DOSE REDUCTION: This exam was performed according to the  departmental dose-optimization program which includes automated exposure control, adjustment of the mA and/or kV according to patient size and/or use of iterative reconstruction technique. COMPARISON:  CT abdomen and pelvis dated 02/14/2022 FINDINGS: Lower chest: No focal consolidation or pulmonary nodule in the lung bases. No pleural effusion or pneumothorax demonstrated. Partially imaged heart size is normal. Hepatobiliary: No focal hepatic lesions. No intra or extrahepatic biliary ductal dilation. Normal gallbladder. Pancreas: No focal lesions or main ductal dilation. Spleen: Normal in size without focal abnormality. Adrenals/Urinary Tract: No adrenal nodules. Persistent 5 mm stone within the proximal left ureter. Additional stones are also seen within the distal ureter measuring up to 4 mm. Mild associated left hydroureteronephrosis with slightly asymmetric perinephric stranding. No right hydronephrosis or calculi. No focal bladder wall thickening. Stomach/Bowel: Normal appearance of the stomach. No evidence of bowel wall thickening, distention, or inflammatory changes. Normal appendix. Vascular/Lymphatic: Aortic atherosclerosis. No enlarged abdominal or pelvic lymph nodes. 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 Other: No free fluid, fluid collection, or free air. Musculoskeletal: No acute or abnormal lytic or blastic osseous lesions. 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 IMPRESSION: 1. New mild left hydroureteronephrosis with  multiple left ureteral stones as described. 2.  Aortic Atherosclerosis (ICD10-I70.0). Electronically Signed   By: Darrin Nipper M.D.   On: 03/21/2022 18:21    Review of Systems  Constitutional: Negative.  Negative for fever.  Gastrointestinal:  Positive for nausea.  Genitourinary:  Positive for flank pain.   Blood pressure 126/73, pulse 68, temperature 97.7 F (36.5 C), temperature source Oral, resp. rate 18, height 5\' 8"   (1.727 m), weight 70.1 kg, SpO2 98 %. Physical Exam Vitals reviewed.  Constitutional:      Comments: Very pleasant.   HENT:     Head: Normocephalic.  Eyes:     Pupils: Pupils are equal, round, and reactive to light.  Cardiovascular:     Rate and Rhythm: Normal rate.  Pulmonary:     Effort: Pulmonary effort is normal.  Genitourinary:    Comments: Moderate left CVAT Musculoskeletal:     Cervical back: Normal range of motion.  Skin:    General: Skin is warm.  Neurological:     General: No focal deficit present.     Mental Status: He is alert.  Psychiatric:        Mood and Affect: Mood normal.     Assessment/Plan:  Rediscussed options of continued medical therapy for fragments v. Stenting alone v. Definitive ureteroscopy, he opts for latter. Risks, benefits, peri-op course discussed.   [name] 03/22/2022, 4:03 PM

## 2022-03-23 ENCOUNTER — Encounter (HOSPITAL_COMMUNITY): Payer: Self-pay | Admitting: Urology

## 2022-03-23 DIAGNOSIS — R109 Unspecified abdominal pain: Secondary | ICD-10-CM | POA: Diagnosis not present

## 2022-03-23 DIAGNOSIS — N201 Calculus of ureter: Secondary | ICD-10-CM | POA: Diagnosis not present

## 2022-03-23 DIAGNOSIS — N3000 Acute cystitis without hematuria: Secondary | ICD-10-CM | POA: Diagnosis not present

## 2022-03-23 DIAGNOSIS — E7849 Other hyperlipidemia: Secondary | ICD-10-CM | POA: Diagnosis not present

## 2022-03-23 LAB — CBC
HCT: 35.7 % — ABNORMAL LOW (ref 39.0–52.0)
Hemoglobin: 11.5 g/dL — ABNORMAL LOW (ref 13.0–17.0)
MCH: 30.4 pg (ref 26.0–34.0)
MCHC: 32.2 g/dL (ref 30.0–36.0)
MCV: 94.4 fL (ref 80.0–100.0)
Platelets: 166 10*3/uL (ref 150–400)
RBC: 3.78 MIL/uL — ABNORMAL LOW (ref 4.22–5.81)
RDW: 14.3 % (ref 11.5–15.5)
WBC: 4.8 10*3/uL (ref 4.0–10.5)
nRBC: 0 % (ref 0.0–0.2)

## 2022-03-23 LAB — BASIC METABOLIC PANEL
Anion gap: 6 (ref 5–15)
BUN: 26 mg/dL — ABNORMAL HIGH (ref 8–23)
CO2: 25 mmol/L (ref 22–32)
Calcium: 8.7 mg/dL — ABNORMAL LOW (ref 8.9–10.3)
Chloride: 105 mmol/L (ref 98–111)
Creatinine, Ser: 1.33 mg/dL — ABNORMAL HIGH (ref 0.61–1.24)
GFR, Estimated: 55 mL/min — ABNORMAL LOW (ref >60)
Glucose, Bld: 187 mg/dL — ABNORMAL HIGH (ref 70–99)
Potassium: 4.2 mmol/L (ref 3.5–5.1)
Sodium: 136 mmol/L (ref 135–145)

## 2022-03-23 MED ORDER — ERTAPENEM IV (FOR PTA / DISCHARGE USE ONLY): 1.0000 g | INTRAVENOUS | 0 refills | Status: DC

## 2022-03-23 MED ORDER — SODIUM CHLORIDE 0.9 % IV SOLN
1.0000 g | INTRAVENOUS | Status: DC
  Administered 2022-03-23 – 2022-03-24 (×2): 1000 mg via INTRAVENOUS
  Filled 2022-03-23 (×2): qty 1

## 2022-03-23 MED ORDER — ROSUVASTATIN CALCIUM 5 MG PO TABS
5.0000 mg | ORAL_TABLET | Freq: Every day | ORAL | Status: DC
  Administered 2022-03-23 – 2022-03-24 (×2): 5 mg via ORAL
  Filled 2022-03-23 (×2): qty 1

## 2022-03-23 MED ORDER — POLYETHYLENE GLYCOL 3350 17 G PO PACK
17.0000 g | PACK | Freq: Every day | ORAL | Status: DC | PRN
  Administered 2022-03-23: 17 g via ORAL
  Filled 2022-03-23: qty 1

## 2022-03-23 NOTE — TOC Progression Note (Signed)
Transition of Care [facility]) - Progression Note    Patient Details  Name: Shawn Walsh MRN: [id number] Date of Birth: 09/12/1945  Transition of [facility] CM/SW Contact  Purcell Mouton, RN Phone Number: 03/23/2022, 12:29 PM  Clinical Narrative:     Spoke with pt concerning discharge plans and home health. [facility] was selected for [facility].  [facility] for IV antibiotics if pt need. Will need orders from MD. Thanks.  Expected Discharge Plan: [place] Barriers to Discharge: No Barriers Identified  Expected Discharge Plan and Services     Post Acute Care Choice: [place] arrangements for the past 2 months: Single Family Home                           HH Arranged: RN, IV Antibiotics HH Agency: [place] Date [place]: 03/23/22 Time [place]: 1038 Representative spoke with at [place]: Tommi Rumps, RN   Social Determinants of Health ([place]) Interventions SDOH Screenings   Food Insecurity: No Food Insecurity (03/21/2022)  Housing: Low Risk  (03/21/2022)  Transportation Needs: No Transportation Needs (03/21/2022)  Utilities: Not At Risk (03/21/2022)  Tobacco Use: Low Risk  (03/23/2022)    Readmission Risk Interventions     No data to display

## 2022-03-23 NOTE — Progress Notes (Signed)
1 Day Post-Op   Subjective/Chief Complaint:  1 - Left Renal colic After Shockwave Lithotripsy - s/p LEFT SWl 03/13/22 for 22mm proximal stone. Significant falnk pain prompting ER CT 3/19 with multifocal ureteral fragemtns and pian difficult to control. Admitted for pain control but still problematic 3/20 therefore taken for definitive ureteroscopy to stone free. Tethered stent removed 3/21.    Today "Shawn Walsh" is stable. No fevers overnight. Some dysuria form stent as expected.      Objective: Vital signs in last 24 hours: Temp:  [97.3 F (36.3 C)-98.8 F (37.1 C)] 97.3 F (36.3 C) (03/21 0525) Pulse Rate:  [64-73] 68 (03/21 0525) Resp:  [12-19] 16 (03/21 0525) BP: (109-126)/(64-76) 109/66 (03/21 0525) SpO2:  [95 %-99 %] 98 % (03/21 0525) Weight:  [70.1 kg-74.5 kg] 74.5 kg (03/21 0525) Last BM Date : 03/21/22  Intake/Output from previous day: 03/20 0701 - 03/21 0700 In: 1200 [I.V.:1000; IV Piggyback:200] Out: 10 [Blood:10] Intake/Output this shift: No intake/output data recorded.  Very pleasant and spry Wearing glasses Non-labored breathing on RA SNTND Tethered stent fashioned to penis, removed, insepctected and intact, discarded No c/c/e  Lab Results:  Recent Labs    03/22/22 0355 03/23/22 0427  WBC 7.5 4.8  HGB 12.2* 11.5*  HCT 37.8* 35.7*  PLT 180 166   BMET Recent Labs    03/22/22 0355 03/23/22 0427  NA 137 136  K 4.6 4.2  CL 106 105  CO2 23 25  GLUCOSE 132* 187*  BUN 28* 26*  CREATININE 1.55* 1.33*  CALCIUM 8.8* 8.7*   PT/INR No results for input(s): "LABPROT", "INR" in the last 72 hours. ABG No results for input(s): "PHART", "HCO3" in the last 72 hours.  Invalid input(s): "PCO2", "PO2"  Studies/Results: DG C-Arm 1-60 Min-No Report  Result Date: 03/22/2022 Fluoroscopy was utilized by the requesting physician.  No radiographic interpretation.   DG C-Arm 1-60 Min-No Report  Result Date: 03/22/2022 Fluoroscopy was utilized by the requesting  physician.  No radiographic interpretation.   CT Renal Stone Study  Result Date: 03/21/2022 CLINICAL DATA:  Several hour history of left flank pain radiating into the abdomen with nausea and vomiting EXAM: CT ABDOMEN AND PELVIS WITHOUT CONTRAST TECHNIQUE: Multidetector CT imaging of the abdomen and pelvis was performed following the standard protocol without IV contrast. RADIATION DOSE REDUCTION: This exam was performed according to the departmental dose-optimization program which includes automated exposure control, adjustment of the mA and/or kV according to patient size and/or use of iterative reconstruction technique. COMPARISON:  CT abdomen and pelvis dated 02/14/2022 FINDINGS: Lower chest: No focal consolidation or pulmonary nodule in the lung bases. No pleural effusion or pneumothorax demonstrated. Partially imaged heart size is normal. Hepatobiliary: No focal hepatic lesions. No intra or extrahepatic biliary ductal dilation. Normal gallbladder. Pancreas: No focal lesions or main ductal dilation. Spleen: Normal in size without focal abnormality. Adrenals/Urinary Tract: No adrenal nodules. Persistent 5 mm stone within the proximal left ureter. Additional stones are also seen within the distal ureter measuring up to 4 mm. Mild associated left hydroureteronephrosis with slightly asymmetric perinephric stranding. No right hydronephrosis or calculi. No focal bladder wall thickening. Stomach/Bowel: Normal appearance of the stomach. No evidence of bowel wall thickening, distention, or inflammatory changes. Normal appendix. Vascular/Lymphatic: Aortic atherosclerosis. No enlarged abdominal or pelvic lymph nodes. 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 Other: No free fluid, fluid collection, or free air. Musculoskeletal: No acute or abnormal lytic or blastic osseous lesions. 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 IMPRESSION: 1. New mild left  hydroureteronephrosis with multiple left ureteral stones as described. 2.  Aortic Atherosclerosis (ICD10-I70.0). Electronically Signed   By: Darrin Nipper M.D.   On: 03/21/2022 18:21    Anti-infectives: Anti-infectives (From admission, onward)    Start     Dose/Rate Route Frequency Ordered Stop   03/23/22 1245  ertapenem (INVANZ) 1,000 mg in sodium chloride 0.9 % 100 mL IVPB        1 g 200 mL/hr over 30 Minutes Intravenous Every 24 hours 03/23/22 1152     03/22/22 1230  meropenem (MERREM) 1 g in sodium chloride 0.9 % 100 mL IVPB  Status:  Discontinued        1 g 200 mL/hr over 30 Minutes Intravenous Every 12 hours 03/22/22 1134 03/23/22 1152       Assessment/Plan:  Pt now stone free, stent free after staged approach to large stone. I feel OK to DC form Urol perspective at anypiont. He has f/u arranged with [place] in about 2 weeks.    [name] 03/23/2022

## 2022-03-23 NOTE — Progress Notes (Addendum)
PHARMACY CONSULT NOTE FOR:  OUTPATIENT  PARENTERAL ANTIBIOTIC THERAPY (OPAT)  Indication: complicated UTI s/p L ureteral stent for ureterolithiasis Regimen: ertapenem 1 g IV q24h  End date: 04/01/22   IV antibiotic discharge orders are pended. To discharging provider:  please sign these orders via discharge navigator,  Select New Orders & click on the button choice - Manage This Unsigned Work.     Thank you for allowing pharmacy to be a part of this patient's care.  Park Liter, PharmD Candidate 03/23/2022, 11:52 AM

## 2022-03-23 NOTE — Consult Note (Signed)
[facility]                                                                                        Patient Identification: Patient Name: Shawn Walsh MRN: [id number] [place] Date: 03/21/2022  5:05 PM Today's Date: 03/23/2022 Reason for consult: complicated UTI  Requesting provider: Dr Tana Coast  Principal Problem:   Ureterolithiasis Active Problems:   AKI (acute kidney injury) ([place])   HLD (hyperlipidemia)   UTI (urinary tract infection)   Ureteral stone with hydronephrosis   Antibiotics:  Meropenem 3/20-c  Lines/Hardware: left uerter stent   Assessment 77 year old male with PMH as below including nephrolithiasis status post Left sided incomplete ESWL attempt 3/11, prostate cancer s/p brachytherapy and completion of tx per his report who presented to the ED on 3/19 with left flank pain radiating to the abdomen with nausea and vomiting. Admitted with   # Ureteric colic  # Ureterolithiasis # Complicated UTI  Status post cystoscopy, bilateral retrograde pyelogram, left ureteroscopy with laser lithotripsy and insertion of left ureteral stent 3/15 Urine cx ESBL E coli from [facility]  3/21 left sided stent removed   Recommendations  Continue meropenem  Needs midline  Plan for 10 days  course of meropenem/ertapenem from 3/20. EOT 04/05/22 Monitor CBC and BMP D/w ID pharm D and primary ID will so, please call with questions   OPAT  Diagnosis: Complicated UTI  Culture Result: ESBL E coli   Allergies  Allergen Reactions   Amoxicillin     Other reaction(s): vomiting   Amoxicillin-Pot Clavulanate Nausea And Vomiting   Moxifloxacin Nausea And Vomiting   Nsaids     Other reaction(s): elevated kidney  test    OPAT Orders Discharge antibiotics to be given via PICC line Discharge antibiotics: Ertapenem/meropenem  Per pharmacy protocol  Duration: 10 days   End Date: 04/01/22 [facility]  Per Protocol:  Home health RN for IV administration and teaching; PICC line care and labs.    Labs weekly while on IV antibiotics: X__ CBC with differential X__ BMP __ CMP __ CRP __ ESR __ Vancomycin trough __ CK  X__ Please pull PIC at completion of IV antibiotics __ Please leave PIC in place until doctor has seen patient or been notified  Fax weekly labs to [phone]  Clinic Follow Up Appt: 04/05/22  Rest of the management as per the primary team. Please call with questions or concerns.  Thank you for the consult  Rosiland Oz, MD Infectious Disease Physician [facility] [address] [place], [place] [address] Phone: [phone]  Fax: [phone]  __________________________________________________________________________________________________________ HPI and Hospital Course: 77 year old male with PMH as below including prostate cancer s/p brachytherapy and completion of tx per his report, nephrolithiasis status post Left incomplete ESWL 3/11 who presented to the ED on 3/19 with left flank pain radiating to the abdomen with nausea and vomiting.  Denies fevers, chills, nausea vomiting or diarrhea.  He was scheduled for ESWL on 3/11 but this was canceled mid procedure due to possibility of patient taking ibuprofen  At ED afebrile,  Labs remarkable  for 1.36 3/15 Urine cx from [facility] ESBL E coli, R bactrim, ciprofloxacin and levofloxacin, S to nitrofurantoin ( urine), S ertapenem ( MIC<0.5)and meropenem ( MIC<1) 3/19 UA WBC 0-5 with no bacteria 3/19 CT new mild left hydroureteronephrosis with multiple left ureteral stones Status post cystoscopy, bilateral retrograde pyelogram, left ureteroscopy with laser lithotripsy and insertion of left ureteral stent. No cx sent   ROS: General- Denies fever, chills, loss of appetite and loss of weight HEENT - Denies headache, blurry vision, neck pain, sinus  pain Chest - Denies any chest pain, SOB or cough CVS- Denies any dizziness/lightheadedness, syncopal attacks, palpitation Abdomen- Denies any nausea, vomiting, hematochezia and diarrhea, left flank and abdominal pain is improving Neuro - Denies any weakness, numbness, tingling sensation Psych - Denies any changes in mood irritability or depressive symptoms GU- Denies any burning, dysuria, hematuria or increased frequency of urination Skin - denies any rashes/lesions MSK - denies any joint pain/swelling or restricted ROM   Past Medical History:  Diagnosis Date   Arthritis    MILD    COVID-19 virus infection    02-2020   History of kidney stones    HLD (hyperlipidemia)    Nephrolithiasis    per renal ultrasound 01-02-2019 in epic, left 36mm   Prostate cancer [facility]) urologist-- dr eskridge/  dr Tammi Klippel   dx 01-14-2019, Stage T1, Gleason 4+5   Wears glasses    Past Surgical History:  Procedure Laterality Date   ABDOMINAL ADHESION SURGERY  1976   [place]   APPENDECTOMY  1972   COLONOSCOPY  01/06/2002   normal   COLONOSCOPY  01/06/2002   CYSTOSCOPY N/A 05/13/2019   Procedure: CYSTOSCOPY FLEXIBLE;  Surgeon: Festus Aloe, MD;  [facility];  Service: Urology;  Laterality: N/A;  NO SEEDS FOUND IN BLADDER   CYSTOSCOPY/URETEROSCOPY/HOLMIUM LASER/STENT PLACEMENT Left 03/22/2022   Procedure: CYSTOSCOPY/ LEFT RETROGRADE/URETEROSCOPY/HOLMIUM LASER/STENT PLACEMENT;  Surgeon: [name], MD;  Location: [facility];  Service: Urology;  Laterality: Left;   EXTRACORPOREAL SHOCK WAVE LITHOTRIPSY Left 03/13/2022   Procedure: LEFT EXTRACORPOREAL SHOCK WAVE LITHOTRIPSY (ESWL);  Surgeon: Franchot Gallo, MD;  Location: [facility];  Service: Urology;  Laterality: Left;  75 MINUTES   RADIOACTIVE SEED IMPLANT N/A 05/13/2019   Procedure: RADIOACTIVE SEED IMPLANT/BRACHYTHERAPY IMPLANT;  Surgeon: Festus Aloe, MD;  Location: [facility];  Service:  Urology;  Laterality: N/A;   62  SEEDS IMPLANTED   SPACE OAR INSTILLATION N/A 05/13/2019   Procedure: SPACE OAR INSTILLATION;  Surgeon: Festus Aloe, MD;  Location: [facility];  Service: Urology;  Laterality: N/A;     Scheduled Meds: Continuous Infusions:  lactated ringers 125 mL/hr at 03/23/22 0527   meropenem (MERREM) IV Stopped (03/23/22 0115)   PRN Meds:.acetaminophen **OR** acetaminophen, morphine injection **OR** morphine injection, prochlorperazine  Allergies  Allergen Reactions   Amoxicillin     Other reaction(s): vomiting   Amoxicillin-Pot Clavulanate Nausea And Vomiting   Moxifloxacin Nausea And Vomiting   Nsaids     Other reaction(s): elevated kidney  test   Social History   Socioeconomic History   Marital status: Married    Spouse name: Not on file   Number of children: 3   Years of education: Not on file   Highest education level: Not on file  Occupational History   Occupation: Insurance   Tobacco Use   Smoking status: Never   Smokeless tobacco: Never  Vaping Use   Vaping Use: Never used  Substance and Sexual  Activity   Alcohol use: No   Drug use: Never   Sexual activity: Not on file  Other Topics Concern   Not on file  Social History Narrative   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   Vegetarian.   Social Determinants of Health   Financial Resource Strain: Not on file  Food Insecurity: No Food Insecurity (03/21/2022)   Hunger Vital Sign    Worried About Running Out of Food in the Last Year: Never true    Ran Out of Food in the Last Year: Never true  Transportation Needs: No Transportation Needs (03/21/2022)   PRAPARE - Hydrologist (Medical): No    Lack of Transportation (Non-Medical): No  Physical Activity: Not on file  Stress: Not on file  Social Connections: Not on file  Intimate Partner Violence: Not At Risk (03/21/2022)   Humiliation, Afraid,  Rape, and Kick questionnaire    Fear of Current or Ex-Partner: No    Emotionally Abused: No    Physically Abused: No    Sexually Abused: No   Family History  Problem Relation Age of Onset   Hyperlipidemia Brother    Colon cancer Neg Hx    Prostate cancer Neg Hx    Pancreatic cancer Neg Hx    Breast cancer Neg Hx    Colon polyps Neg Hx    Esophageal cancer Neg Hx    Vitals BP 109/66 (BP Location: Right Arm)   Pulse 68   Temp (!) 97.3 F (36.3 C) (Oral)   Resp 16   Ht 5\' 8"  (1.727 m)   Wt 74.5 kg   SpO2 98%   BMI 24.97 kg/m    Physical Exam Constitutional:  adult male sitting in the bed and appears comfortable     Comments:   Cardiovascular:     Rate and Rhythm: Normal rate and regular rhythm.     Heart sounds: s1s2  Pulmonary:     Effort: Pulmonary effort is normal on room air     Comments: Normal breath sounds   Abdominal:     Palpations: Abdomen is soft.     Tenderness: non distended and non tender   Musculoskeletal:        General: No swelling or tenderness in peripheral joints   Skin:    Comments: No rashes   Neurological:     General: awake, alert and oriented, grossly non focal, follows commands appropriately   Psychiatric:        Mood and Affect: Mood normal.    Pertinent Microbiology Results for orders placed or performed during the hospital encounter of 04/15/20  Urine culture     Status: Abnormal   Collection Time: 04/15/20 11:01 PM   Specimen: Urine, Random  Result Value Ref Range Status   Specimen Description   Final    URINE, RANDOM Performed at [facility], [address] [place], [place] [address]    Special Requests   Final    NONE Performed at [facility], [address] [place], [place] [address]    Culture (A)  Final    <10,000 COLONIES/mL INSIGNIFICANT GROWTH Performed at [facility], [place] [address], [place], [place] [address]    Report Status 04/17/2020 FINAL  Final     Pertinent Lab  seen by me:    Latest Ref Rng & Units 03/23/2022    4:27 AM 03/22/2022  3:55 AM 03/21/2022    5:23 PM  CBC  WBC 4.0 - 10.5 K/uL 4.8  7.5  7.4   Hemoglobin 13.0 - 17.0 g/dL 11.5  12.2  13.0   Hematocrit 39.0 - 52.0 % 35.7  37.8  40.0   Platelets 150 - 400 K/uL 166  180  218       Latest Ref Rng & Units 03/23/2022    4:27 AM 03/22/2022    3:55 AM 03/21/2022    5:23 PM  CMP  Glucose 70 - 99 mg/dL 187  132  125   BUN 8 - 23 mg/dL 26  28  30    Creatinine 0.61 - 1.24 mg/dL 1.33  1.55  1.36   Sodium 135 - 145 mmol/L 136  137  138   Potassium 3.5 - 5.1 mmol/L 4.2  4.6  3.8   Chloride 98 - 111 mmol/L 105  106  104   CO2 22 - 32 mmol/L 25  23  24    Calcium 8.9 - 10.3 mg/dL 8.7  8.8  9.1   Total Protein 6.5 - 8.1 g/dL  6.8    Total Bilirubin 0.3 - 1.2 mg/dL  1.0    Alkaline Phos 38 - 126 U/L  81    AST 15 - 41 U/L  22    ALT 0 - 44 U/L  17       Pertinent Imagings/Other Imagings Plain films and CT images have been personally visualized and interpreted; radiology reports have been reviewed. Decision making incorporated into the Impression / Recommendations.  DG C-Arm 1-60 Min-No Report  Result Date: 03/22/2022 Fluoroscopy was utilized by the requesting physician.  No radiographic interpretation.   DG C-Arm 1-60 Min-No Report  Result Date: 03/22/2022 Fluoroscopy was utilized by the requesting physician.  No radiographic interpretation.   CT Renal Stone Study  Result Date: 03/21/2022 CLINICAL DATA:  Several hour history of left flank pain radiating into the abdomen with nausea and vomiting EXAM: CT ABDOMEN AND PELVIS WITHOUT CONTRAST TECHNIQUE: Multidetector CT imaging of the abdomen and pelvis was performed following the standard protocol without IV contrast. RADIATION DOSE REDUCTION: This exam was performed according to the departmental dose-optimization program which includes automated exposure control, adjustment of the mA and/or kV according to patient size and/or use of iterative  reconstruction technique. COMPARISON:  CT abdomen and pelvis dated 02/14/2022 FINDINGS: Lower chest: No focal consolidation or pulmonary nodule in the lung bases. No pleural effusion or pneumothorax demonstrated. Partially imaged heart size is normal. Hepatobiliary: No focal hepatic lesions. No intra or extrahepatic biliary ductal dilation. Normal gallbladder. Pancreas: No focal lesions or main ductal dilation. Spleen: Normal in size without focal abnormality. Adrenals/Urinary Tract: No adrenal nodules. Persistent 5 mm stone within the proximal left ureter. Additional stones are also seen within the distal ureter measuring up to 4 mm. Mild associated left hydroureteronephrosis with slightly asymmetric perinephric stranding. No right hydronephrosis or calculi. No focal bladder wall thickening. Stomach/Bowel: Normal appearance of the stomach. No evidence of bowel wall thickening, distention, or inflammatory changes. Normal appendix. Vascular/Lymphatic: Aortic atherosclerosis. No enlarged abdominal or pelvic lymph nodes. 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 Other: No free fluid, fluid collection, or free air. Musculoskeletal: No acute or abnormal lytic or blastic osseous lesions. 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 IMPRESSION: 1. New mild left hydroureteronephrosis with multiple left ureteral stones as described. 2.  Aortic Atherosclerosis (ICD10-I70.0). Electronically Signed   By: Shawn Route.D.  On: 03/21/2022 18:21   DG Abd 1 View  Result Date: 03/14/2022 CLINICAL DATA:  Left ureteral stone.  Preop for lithotripsy. EXAM: ABDOMEN - 1 VIEW COMPARISON:  03/08/2022. FINDINGS: The bowel gas pattern is normal. No renal calculus is seen bilaterally. There is a 7 mm calculus at the level of the L2-L3 interspace on the left, compatible with known ureteral calculus, not significantly changed from the prior exam. IMPRESSION: 7 mm left ureteral calculus,  unchanged from the prior exam. Electronically Signed   By: Brett Fairy M.D.   On: 03/14/2022 03:16    I spent 80 minutes for this patient encounter including review of prior medical records/discussing diagnostics and treatment plan with the patient/family/coordinate care with primary/other specialits with greater than 50% of time in face to face encounter.   Electronically signed by:   Rosiland Oz, MD Infectious Disease Physician [facility] Pager: [phone]

## 2022-03-23 NOTE — Progress Notes (Signed)
[facility]                                                                              Shawn Walsh, is a 77 y.o. male, DOB - 1945/03/13, [id number] Admit date - 03/21/2022    Outpatient Primary MD for the patient is Harrington Challenger, Dwyane Luo, MD  LOS - 1  days  Chief Complaint  Patient presents with   Flank Pain       Brief summary   Patient is a 77 year old male with history of renal stones, hyperlipidemia presented to ED with worsening pain, nausea and vomiting.  He was initially scheduled to have shockwave lithotripsy on 3/11 but was canceled mid procedure due to patient taking NSAIDs/ibuprofen.  Operative note states he had partial procedure performed. Patient presented to ER with complaints of left flank pain, radiating onto the abdomen with intractable nausea and vomiting with no significant relief with oxycodone. Vital signs stable in ED, Labs showed BUN 30, creatinine 1.36.  Creatinine was 1.07 on 02/14/2022.  CT renal stone study showed new mild left hydro utero nephrosis with multiple left ureteral stones  Of note, patient was seen by Dr. Link Snuffer last week on call and had sent a culture which grew esbl E. coli sensitive to cefotetan, meropenem, macrobid, gentamicin, zosyn, tobramycin   Assessment & Plan    Principal Problem: Acute abdominal pain secondary to ureterolithiasis, new mild left hydroureteronephrosis -Urology consulted, underwent cystoscopy with bilateral retrograde pyelogram, left ureteroscopy with laser lithotripsy and insertion of left ureteral stent on 3/20  -Urine culture growing ESBL E. coli, started on IV meropenem  -Seen by urology today, stent removed -ID consulted, plan for 10-day course of meropenem/ertapenem with end of treatment on 04/05/2022 -Needs midline for home health RN, for IV antibiotics   Active Problems:   AKI (acute kidney injury) ([place]) -Creatinine worsened to 1.5 today, baseline 1.0-1.1 -Creatinine improved to  1.3 continue IV fluids, decrease rate to 100 cc an hour     HLD (hyperlipidemia) -Resume Crestor   ESBL E. coli UTI -See #1, will place on IV meropenem  Code Status: Full code DVT Prophylaxis:  SCDs Start: 03/21/22 2240   Level of Care: Level of care: Med-Surg Family Communication: Updated patient Disposition Plan:      Remains inpatient appropriate: Plan for DC home likely in a.m. as midline will be placed later in the evening today  PROCEDURE PERFORMED: 1.  Cystoscopy with bilateral retrograde pyelogram interpretation. 2.  Left ureteroscopy with laser lithotripsy, second stage. 3.  Insertion of left ureteral stent.  Consultants:   Urology Infectious disease  Antimicrobials:   Anti-infectives (From admission, onward)    Start     Dose/Rate Route Frequency Ordered Stop   03/23/22 1245  ertapenem (INVANZ) 1,000 mg in sodium chloride 0.9 % 100 mL IVPB        1 g 200 mL/hr over 30 Minutes Intravenous Every 24 hours 03/23/22 1152     03/23/22 0000  ertapenem (INVANZ) IVPB        1 g Intravenous Every 24 hours 03/23/22 1331 04/01/22 2359   03/22/22 1230  meropenem (MERREM) 1  g in sodium chloride 0.9 % 100 mL IVPB  Status:  Discontinued        1 g 200 mL/hr over 30 Minutes Intravenous Every 12 hours 03/22/22 1134 03/23/22 1152       Medications     Subjective:   Leanthony [id number] was seen and examined today.  Feels a lot better today, overnight no fevers, no nausea vomiting or abdominal pain.    Objective:   Vitals:   03/22/22 1852 03/22/22 1926 03/23/22 0525 03/23/22 1342  BP: 112/76 109/73 109/66 (!) 118/59  Pulse: 65 64 68 76  Resp: 16 19 16 16   Temp: (!) 97.5 F (36.4 C) (!) 97.5 F (36.4 C) (!) 97.3 F (36.3 C) 98.6 F (37 C)  TempSrc: Oral Oral Oral   SpO2: 96% 98% 98% 100%  Weight:   74.5 kg   Height:        Intake/Output Summary (Last 24 hours) at 03/23/2022 1412 Last data filed at 03/23/2022 [id number] Gross per 24 hour  Intake 1200 ml  Output 10 ml   Net 1190 ml     Wt Readings from Last 3 Encounters:  03/23/22 74.5 kg  03/13/22 68.8 kg  02/14/22 68.5 kg    Physical Exam General: Alert and oriented x 3, NAD Cardiovascular: S1 S2 clear, RRR.  Respiratory: CTAB, no wheezing Gastrointestinal: Soft, nontender, nondistended, NBS Ext: no pedal edema bilaterally Neuro: no new deficits Psych: Normal affect     Data Reviewed:  I have personally reviewed following labs    CBC Lab Results  Component Value Date   WBC 4.8 03/23/2022   RBC 3.78 (L) 03/23/2022   HGB 11.5 (L) 03/23/2022   HCT 35.7 (L) 03/23/2022   MCV 94.4 03/23/2022   MCH 30.4 03/23/2022   PLT 166 03/23/2022   MCHC 32.2 03/23/2022   RDW 14.3 03/23/2022   LYMPHSABS 1.5 03/22/2022   MONOABS 0.6 03/22/2022   EOSABS 0.0 03/22/2022   BASOSABS 0.0 [id number]     Last metabolic panel Lab Results  Component Value Date   NA 136 03/23/2022   K 4.2 03/23/2022   CL 105 03/23/2022   CO2 25 03/23/2022   BUN 26 (H) 03/23/2022   CREATININE 1.33 (H) 03/23/2022   GLUCOSE 187 (H) 03/23/2022   GFRNONAA 55 (L) 03/23/2022   GFRAA >60 05/09/2019   CALCIUM 8.7 (L) 03/23/2022   PROT 6.8 03/22/2022   ALBUMIN 3.5 03/22/2022   BILITOT 1.0 03/22/2022   ALKPHOS 81 03/22/2022   AST 22 03/22/2022   ALT 17 03/22/2022   ANIONGAP 6 03/23/2022    CBG (last 3)  No results for input(s): "GLUCAP" in the last 72 hours.    Coagulation Profile: No results for input(s): "INR", "PROTIME" in the last 168 hours.   Radiology Studies: I have personally reviewed the imaging studies  DG C-Arm 1-60 Min-No Report  Result Date: 03/22/2022 Fluoroscopy was utilized by the requesting physician.  No radiographic interpretation.   DG C-Arm 1-60 Min-No Report  Result Date: 03/22/2022 Fluoroscopy was utilized by the requesting physician.  No radiographic interpretation.   CT Renal Stone Study  Result Date: 03/21/2022 CLINICAL DATA:  Several hour history of left flank pain radiating  into the abdomen with nausea and vomiting EXAM: CT ABDOMEN AND PELVIS WITHOUT CONTRAST TECHNIQUE: Multidetector CT imaging of the abdomen and pelvis was performed following the standard protocol without IV contrast. RADIATION DOSE REDUCTION: This exam was performed according to the departmental dose-optimization program which includes automated exposure  control, adjustment of the mA and/or kV according to patient size and/or use of iterative reconstruction technique. COMPARISON:  CT abdomen and pelvis dated 02/14/2022 FINDINGS: Lower chest: No focal consolidation or pulmonary nodule in the lung bases. No pleural effusion or pneumothorax demonstrated. Partially imaged heart size is normal. Hepatobiliary: No focal hepatic lesions. No intra or extrahepatic biliary ductal dilation. Normal gallbladder. Pancreas: No focal lesions or main ductal dilation. Spleen: Normal in size without focal abnormality. Adrenals/Urinary Tract: No adrenal nodules. Persistent 5 mm stone within the proximal left ureter. Additional stones are also seen within the distal ureter measuring up to 4 mm. Mild associated left hydroureteronephrosis with slightly asymmetric perinephric stranding. No right hydronephrosis or calculi. No focal bladder wall thickening. Stomach/Bowel: Normal appearance of the stomach. No evidence of bowel wall thickening, distention, or inflammatory changes. Normal appendix. Vascular/Lymphatic: Aortic atherosclerosis. No enlarged abdominal or pelvic lymph nodes. 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 Other: No free fluid, fluid collection, or free air. Musculoskeletal: No acute or abnormal lytic or blastic osseous lesions. 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 IMPRESSION: 1. New mild left hydroureteronephrosis with multiple left ureteral stones as described. 2.  Aortic Atherosclerosis (ICD10-I70.0). Electronically Signed   By: Darrin Nipper M.D.   On: 03/21/2022  18:21       Kealan Buchan M.D. [facility] 03/23/2022, 2:12 PM  Available via Epic secure chat 7am-7pm After 7 pm, please refer to night coverage provider listed on amion.

## 2022-03-23 NOTE — Progress Notes (Incomplete)
PHARMACY CONSULT NOTE FOR:  OUTPATIENT  PARENTERAL ANTIBIOTIC THERAPY (OPAT)  Indication:  Regimen:  End date:   IV antibiotic discharge orders are pended. To discharging provider:  please sign these orders via discharge navigator,  Select New Orders & click on the button choice - Manage This Unsigned Work.     Thank you for allowing pharmacy to be a part of this patient's care.  Lawson Radar 03/23/2022, 8:45 AM

## 2022-03-24 ENCOUNTER — Ambulatory Visit (HOSPITAL_BASED_OUTPATIENT_CLINIC_OR_DEPARTMENT_OTHER): Admission: RE | Admit: 2022-03-24 | Payer: Medicare Other | Source: Home / Self Care | Admitting: Urology

## 2022-03-24 ENCOUNTER — Encounter (HOSPITAL_BASED_OUTPATIENT_CLINIC_OR_DEPARTMENT_OTHER): Admission: RE | Payer: Self-pay | Source: Home / Self Care

## 2022-03-24 DIAGNOSIS — N3000 Acute cystitis without hematuria: Secondary | ICD-10-CM | POA: Diagnosis not present

## 2022-03-24 DIAGNOSIS — N201 Calculus of ureter: Secondary | ICD-10-CM | POA: Diagnosis not present

## 2022-03-24 DIAGNOSIS — R109 Unspecified abdominal pain: Secondary | ICD-10-CM | POA: Diagnosis not present

## 2022-03-24 LAB — CBC
HCT: 34.2 % — ABNORMAL LOW (ref 39.0–52.0)
Hemoglobin: 10.9 g/dL — ABNORMAL LOW (ref 13.0–17.0)
MCH: 30.6 pg (ref 26.0–34.0)
MCHC: 31.9 g/dL (ref 30.0–36.0)
MCV: 96.1 fL (ref 80.0–100.0)
Platelets: UNDETERMINED 10*3/uL (ref 150–400)
RBC: 3.56 MIL/uL — ABNORMAL LOW (ref 4.22–5.81)
RDW: 14.4 % (ref 11.5–15.5)
WBC: 7.9 10*3/uL (ref 4.0–10.5)
nRBC: 0 % (ref 0.0–0.2)

## 2022-03-24 LAB — BASIC METABOLIC PANEL
Anion gap: 7 (ref 5–15)
BUN: 23 mg/dL (ref 8–23)
CO2: 22 mmol/L (ref 22–32)
Calcium: 8.3 mg/dL — ABNORMAL LOW (ref 8.9–10.3)
Chloride: 106 mmol/L (ref 98–111)
Creatinine, Ser: 1.12 mg/dL (ref 0.61–1.24)
GFR, Estimated: >60 mL/min (ref >60)
Glucose, Bld: 93 mg/dL (ref 70–99)
Potassium: 4.4 mmol/L (ref 3.5–5.1)
Sodium: 135 mmol/L (ref 135–145)

## 2022-03-24 SURGERY — LITHOTRIPSY, ESWL
Anesthesia: LOCAL | Laterality: Left

## 2022-03-24 MED ORDER — KETOROLAC TROMETHAMINE 10 MG PO TABS: 10.0000 mg | ORAL_TABLET | Freq: Three times a day (TID) | ORAL | 0 refills | Status: DC | PRN

## 2022-03-24 MED ORDER — POLYETHYLENE GLYCOL 3350 17 G PO PACK: 17.0000 g | PACK | Freq: Every day | ORAL | 0 refills | Status: DC | PRN

## 2022-03-24 MED ORDER — HYDROCODONE-ACETAMINOPHEN 5-325 MG PO TABS: 1.0000 | ORAL_TABLET | Freq: Four times a day (QID) | ORAL | 0 refills | Status: DC | PRN

## 2022-03-24 MED ORDER — SORBITOL 70 % SOLN
300.0000 mL | TOPICAL_OIL | Freq: Once | ORAL | Status: AC
  Administered 2022-03-24: 300 mL via RECTAL
  Filled 2022-03-24: qty 90

## 2022-03-24 MED ORDER — ONDANSETRON 4 MG PO TBDP: 4.0000 mg | ORAL_TABLET | Freq: Three times a day (TID) | ORAL | 0 refills | Status: DC | PRN

## 2022-03-24 MED ORDER — KETOROLAC TROMETHAMINE 10 MG PO TABS
10.0000 mg | ORAL_TABLET | Freq: Three times a day (TID) | ORAL | Status: DC
  Administered 2022-03-24: 10 mg via ORAL
  Filled 2022-03-24: qty 1

## 2022-03-24 MED ORDER — ERTAPENEM IV (FOR PTA / DISCHARGE USE ONLY): 1.0000 g | INTRAVENOUS | 0 refills | Status: AC

## 2022-03-24 MED ORDER — FLEET ENEMA 7-19 GM/118ML RE ENEM
1.0000 | ENEMA | Freq: Once | RECTAL | Status: AC
  Administered 2022-03-24: 1 via RECTAL
  Filled 2022-03-24: qty 1

## 2022-03-24 NOTE — TOC Transition Note (Signed)
Transition of Care [facility]) - CM/SW Discharge Note   Patient Details  Name: Shawn Walsh MRN: [id number] Date of Birth: 1945/03/20  [facility] CM/SW Contact:  Lennart Pall, LCSW Phone Number: 03/24/2022, 10:10 AM   Clinical Narrative:     Pt ready for dc home today.  HHRN has been set up with [facility] and IV abx arranged via [place].  No further TOC needs.  Final next level of care: Home w Home Health Services Barriers to Discharge: Barriers Resolved   Patient Goals and CMS Choice CMS Medicare.gov Compare Post Acute Care list provided to:: Patient Choice offered to / list presented to : Patient  Discharge Placement                         Discharge Plan and Services Additional resources added to the After Visit Summary for       Post Acute Care Choice: Home Health          DME Arranged: N/A DME Agency: NA       HH Arranged: RN, IV Antibiotics HH Agency: [place] Date [facility] Agency Contacted: 03/23/22 Time [place]: [id number] Representative spoke with at [place] Agency: Tommi Rumps, RN  Social Determinants of Health ([place]) Interventions SDOH Screenings   Food Insecurity: No Food Insecurity (03/21/2022)  Housing: [place]  (03/21/2022)  Transportation Needs: No Transportation Needs (03/21/2022)  Utilities: Not At Risk (03/21/2022)  Tobacco Use: Low Risk  (03/23/2022)     Readmission Risk Interventions     No data to display

## 2022-03-24 NOTE — Progress Notes (Signed)
Discharge instructions given. No other needs at this time.

## 2022-03-24 NOTE — Plan of Care (Signed)

## 2022-03-24 NOTE — Progress Notes (Signed)
2 Days Post-Op   Subjective/Chief Complaint:  1 - Left Renal colic After Shockwave Lithotripsy - s/p LEFT SWl 03/13/22 for 63mm proximal stone. Significant falnk pain prompting ER CT 3/19 with multifocal ureteral fragemtns and pian difficult to control. Admitted for pain control but still problematic 3/20 therefore taken for definitive ureteroscopy to stone free. Tethered stent removed 3/21.    Today "Shawn Walsh" is stable. No fevers overnight. Some resolving but persistant left sided flank pain after stent pull yesterday as expected. GFR now completely normalized. Received PICC to finish course for ESBL bacteruria, fortunatley did not develop clinical infection.    Objective: Vital signs in last 24 hours: Temp:  [98.4 F (36.9 C)-98.6 F (37 C)] 98.4 F (36.9 C) (03/22 0515) Pulse Rate:  [69-76] 69 (03/22 0515) Resp:  [16-18] 18 (03/22 0515) BP: (118-131)/(59-88) 123/76 (03/22 0515) SpO2:  [100 %] 100 % (03/22 0515) Weight:  [72.1 kg] 72.1 kg (03/22 0500) Last BM Date : 03/21/22  Intake/Output from previous day: 03/21 0701 - 03/22 0700 In: 970 [I.V.:870; IV Piggyback:100] Out: -  Intake/Output this shift: No intake/output data recorded.  Very pleasant and spry, wife and daughter join by video chat Wearing glasses Non-labored breathing on RA SNTND No catheter No c/c/e  Lab Results:  Recent Labs    03/23/22 0427 03/24/22 0429  WBC 4.8 7.9  HGB 11.5* 10.9*  HCT 35.7* 34.2*  PLT 166 PLATELET CLUMPS NOTED ON SMEAR, UNABLE TO ESTIMATE   BMET Recent Labs    03/23/22 0427 03/24/22 0429  NA 136 135  K 4.2 4.4  CL 105 106  CO2 25 22  GLUCOSE 187* 93  BUN 26* 23  CREATININE 1.33* 1.12  CALCIUM 8.7* 8.3*   PT/INR No results for input(s): "LABPROT", "INR" in the last 72 hours. ABG No results for input(s): "PHART", "HCO3" in the last 72 hours.  Invalid input(s): "PCO2", "PO2"  Studies/Results: DG C-Arm 1-60 Min-No Report  Result Date: 03/22/2022 Fluoroscopy was  utilized by the requesting physician.  No radiographic interpretation.   DG C-Arm 1-60 Min-No Report  Result Date: 03/22/2022 Fluoroscopy was utilized by the requesting physician.  No radiographic interpretation.    Anti-infectives: Anti-infectives (From admission, onward)    Start     Dose/Rate Route Frequency Ordered Stop   03/24/22 0000  ertapenem [facility]) IVPB        1 g Intravenous Every 24 hours 03/24/22 0858 04/02/22 2359   03/23/22 1245  ertapenem (INVANZ) 1,000 mg in sodium chloride 0.9 % 100 mL IVPB        1 g 200 mL/hr over 30 Minutes Intravenous Every 24 hours 03/23/22 1152     03/23/22 0000  ertapenem [facility]) IVPB  Status:  Discontinued        1 g Intravenous Every 24 hours 03/23/22 1331 03/24/22    03/22/22 1230  meropenem (MERREM) 1 g in sodium chloride 0.9 % 100 mL IVPB  Status:  Discontinued        1 g 200 mL/hr over 30 Minutes Intravenous Every 12 hours 03/22/22 1134 03/23/22 1152       Assessment/Plan:  Doing as expected now stoen free. Seom mild residual almost colic sympotms ceratinly are still expected at this point from ureteral edema that takes few days more to resolve. I feel OK to use ketorolac, RX'd for hospital and added to home meds. He is reassured and happy pain is trending better.    [name] 03/24/2022

## 2022-03-24 NOTE — Discharge Summary (Signed)
Physician Discharge Summary   Patient: Shawn Walsh MRN: [id number] DOB: Mar 04, 1945  Admit date:     03/21/2022  Discharge date: 03/24/22  Discharge Physician: Estill Cotta, MD    PCP: Lawerance Cruel, MD   Recommendations at discharge:   Continue IV ertapenem, 1 gm IV daily for 9 days, EOT 04/01/2022 Home health RN arranged   Discharge Diagnoses:    Ureterolithiasis   left hydroureteronephrosis   AKI (acute kidney injury) ([place])   HLD (hyperlipidemia) ESBL E. coli UTI (urinary tract infection) Constipation    Hospital Course: Patient is a 77 year old male with history of renal stones, hyperlipidemia presented to ED with worsening pain, nausea and vomiting.  He was initially scheduled to have shockwave lithotripsy on 3/11 but was canceled mid procedure due to patient taking NSAIDs/ibuprofen.  Operative note states he had partial procedure performed. Patient presented to ER with complaints of left flank pain, radiating onto the abdomen with intractable nausea and vomiting with no significant relief with oxycodone. Vital signs stable in ED, Labs showed BUN 30, creatinine 1.36.  Creatinine was 1.07 on 02/14/2022.   CT renal stone study showed new mild left hydro utero nephrosis with multiple left ureteral stones   Of note, patient was seen by Dr. Link Snuffer last week on call and had sent a culture which grew esbl E. coli sensitive to cefotetan, meropenem, macrobid, gentamicin, zosyn, tobramycin   Assessment and Plan:  Acute abdominal pain secondary to ureterolithiasis, new left hydroureteronephrosis -Urology was consulted, underwent cystoscopy with bilateral retrograde pyelogram, left ureteroscopy with laser lithotripsy and insertion of left ureteral stent on 3/20  -Urine culture growing ESBL E. coli, started on IV meropenem  -Patient was closely followed by urology, stent was removed on 03/23/2022 -ID consulted, plan for 10-day course of ertapenem with end of treatment on  04/01/2022 as stent is now removed -Midline placed for IV antibiotics at home, home health RN arranged      ESBL E. coli UTI -Midline placed, patient will continue IV ertapenem as per ID recommendations for 9 more days, end of treatment on 04/01/2022    AKI (acute kidney injury) ([place]) -Cr baseline 1.0-1.1, worsened to 1.55, likely due to #1 -Patient underwent lithotripsy and insertion of left ureteral stent on 3/20, stent was removed on 3/21 -He received IV fluid hydration, creatinine has improved to 1.1 at discharge.      HLD (hyperlipidemia) -Resume Crestor     Constipation Continue MiraLAX, receiving enema prior to discharge per patient's request      Pain control - [place] was reviewed. and patient was instructed, not to drive, operate heavy machinery, perform activities at heights, swimming or participation in water activities or provide baby-sitting services while on Pain, Sleep and Anxiety Medications; until their outpatient Physician has advised to do so again. Also recommended to not to take more than prescribed Pain, Sleep and Anxiety Medications.  Consultants: Urology, infectious disease Procedures performed:  1.  Cystoscopy with bilateral retrograde pyelogram interpretation. 2.  Left ureteroscopy with laser lithotripsy, second stage. 3.  Insertion of left ureteral stent. Disposition: Home Diet recommendation:  Discharge Diet Orders (From admission, onward)     Start     Ordered   03/24/22 0000  Diet general        03/24/22 0825            DISCHARGE MEDICATION: Allergies as of 03/24/2022       Reactions   Amoxicillin  Other reaction(s): vomiting   Amoxicillin-pot Clavulanate Nausea And Vomiting   Moxifloxacin Nausea And Vomiting   Nsaids    Other reaction(s): elevated kidney  test        Medication List     TAKE these medications    b complex vitamins capsule Take 1 capsule by mouth daily.    CENTRUM SILVER PO Take 1 tablet by mouth daily.   ertapenem  IVPB Commonly known as: INVANZ Inject 1 g into the vein daily for 9 days. Indication:  ESBL UTI First Dose: Yes Last Day of Therapy:  04/01/22 Labs - Once weekly:  CBC/D and BMP, Labs - Every other week:  ESR and CRP Method of administration: Mini-Bag Plus / Gravity Pull PICC/midline at the completion of IV therapy Method of administration may be changed at the discretion of home infusion pharmacist based upon assessment of the patient and/or caregiver's ability to self-administer the medication ordered.   HYDROcodone-acetaminophen 5-325 MG tablet Commonly known as: NORCO/VICODIN Take 1 tablet by mouth every 6 (six) hours as needed for moderate pain.   ketorolac 10 MG tablet Commonly known as: TORADOL Take 1 tablet (10 mg total) by mouth every 8 (eight) hours as needed for moderate pain (after kidney stone surgery).   omeprazole 10 MG capsule Commonly known as: PRILOSEC Take 20 mg by mouth daily.   ondansetron 4 MG disintegrating tablet Commonly known as: ZOFRAN-ODT Take 1 tablet (4 mg total) by mouth every 8 (eight) hours as needed for up to 20 doses for nausea or vomiting.   oxybutynin 5 MG tablet Commonly known as: DITROPAN Take 5 mg by mouth daily as needed for bladder spasms.   polyethylene glycol 17 g packet Commonly known as: MIRALAX / GLYCOLAX Take 17 g by mouth daily as needed for moderate constipation. Available OTC   rosuvastatin 5 MG tablet Commonly known as: CRESTOR Take 5 mg by mouth daily.   Vitamin D3 25 MCG (1000 UT) capsule Generic drug: Cholecalciferol Take by mouth daily.               Discharge Care Instructions  (From admission, onward)           Start     Ordered   03/24/22 0000  If the dressing is still on your incision site when you go home, remove it on the third day after your surgery date. Remove dressing if it begins to fall off, or if it is dirty or damaged before  the third day.        03/24/22 0825   03/23/22 0000  Change dressing on IV access line weekly and PRN  (Home infusion instructions - [facility] )        03/23/22 1331            Follow-up Information     [facility] Follow up.   Why: as previously scheduled. Contact information: [place] [phone]        [facility] Follow up.   Specialty: Home Health Services Why: Will follow at home for Home Health RN. Contact information: [place] [address] [phone]         [facility] Follow up.   Why: Will follow you at home for IV Antibiotics. telephone # [phone].        Lawerance Cruel, MD. Schedule an appointment as soon as possible for a visit in 2 week(s).  Specialty: Family Medicine Why: for hospital follow-up Contact information: [place] [address] [phone]                Discharge Exam: Danley Danker Weights   03/22/22 1451 03/23/22 0525 03/24/22 0500  Weight: 70.1 kg 74.5 kg 72.1 kg   S: Doing much better, last night had left flank pain but now feeling better.  Has constipation.  Looking forward to go home after enema today.  Cleared by urology.  Stent removed yesterday.   BP 123/76 (BP Location: Left Arm)   Pulse 69   Temp 98.4 F (36.9 C) (Oral)   Resp 18   Ht 5\' 8"  (1.727 m)   Wt 72.1 kg   SpO2 100%   BMI 24.17 kg/m     Physical Exam General: Alert and oriented x 3, NAD Cardiovascular: S1 S2 clear, RRR.  Respiratory: CTAB, no wheezing, rales or rhonchi Gastrointestinal: Soft, nontender, nondistended, NBS, no CVAT Ext: no pedal edema bilaterally Neuro: no new deficits Psych: Normal affect    Condition at discharge: fair  The results of significant diagnostics from this hospitalization (including imaging, microbiology, ancillary and laboratory) are listed below for reference.   Imaging  Studies: DG C-Arm 1-60 Min-No Report  Result Date: 03/22/2022 Fluoroscopy was utilized by the requesting physician.  No radiographic interpretation.   DG C-Arm 1-60 Min-No Report  Result Date: 03/22/2022 Fluoroscopy was utilized by the requesting physician.  No radiographic interpretation.   CT Renal Stone Study  Result Date: 03/21/2022 CLINICAL DATA:  Several hour history of left flank pain radiating into the abdomen with nausea and vomiting EXAM: CT ABDOMEN AND PELVIS WITHOUT CONTRAST TECHNIQUE: Multidetector CT imaging of the abdomen and pelvis was performed following the standard protocol without IV contrast. RADIATION DOSE REDUCTION: This exam was performed according to the departmental dose-optimization program which includes automated exposure control, adjustment of the mA and/or kV according to patient size and/or use of iterative reconstruction technique. COMPARISON:  CT abdomen and pelvis dated 02/14/2022 FINDINGS: Lower chest: No focal consolidation or pulmonary nodule in the lung bases. No pleural effusion or pneumothorax demonstrated. Partially imaged heart size is normal. Hepatobiliary: No focal hepatic lesions. No intra or extrahepatic biliary ductal dilation. Normal gallbladder. Pancreas: No focal lesions or main ductal dilation. Spleen: Normal in size without focal abnormality. Adrenals/Urinary Tract: No adrenal nodules. Persistent 5 mm stone within the proximal left ureter. Additional stones are also seen within the distal ureter measuring up to 4 mm. Mild associated left hydroureteronephrosis with slightly asymmetric perinephric stranding. No right hydronephrosis or calculi. No focal bladder wall thickening. Stomach/Bowel: Normal appearance of the stomach. No evidence of bowel wall thickening, distention, or inflammatory changes. Normal appendix. Vascular/Lymphatic: Aortic atherosclerosis. No enlarged abdominal or pelvic lymph nodes. 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  Other: No free fluid, fluid collection, or free air. Musculoskeletal: No acute or abnormal lytic or blastic osseous lesions. 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 IMPRESSION: 1. New mild left hydroureteronephrosis with multiple left ureteral stones as described. 2.  Aortic Atherosclerosis (ICD10-I70.0). Electronically Signed   By: Darrin Nipper M.D.   On: 03/21/2022 18:21   DG Abd 1 View  Result Date: 03/14/2022 CLINICAL DATA:  Left ureteral stone.  Preop for lithotripsy. EXAM: ABDOMEN - 1 VIEW COMPARISON:  03/08/2022. FINDINGS: The bowel gas pattern is normal. No renal calculus is seen bilaterally. There is a 7 mm calculus at the level of the L2-L3 interspace on the  left, compatible with known ureteral calculus, not significantly changed from the prior exam. IMPRESSION: 7 mm left ureteral calculus, unchanged from the prior exam. Electronically Signed   By: Brett Fairy M.D.   On: 03/14/2022 03:16    Microbiology: Results for orders placed or performed during the hospital encounter of 04/15/20  Urine culture     Status: Abnormal   Collection Time: 04/15/20 11:01 PM   Specimen: Urine, Random  Result Value Ref Range Status   Specimen Description   Final    URINE, RANDOM Performed at [facility], [address] [place], [place] [address]    Special Requests   Final    NONE Performed at [facility], [address] [place], [place] [address]    Culture (A)  Final    <10,000 COLONIES/mL INSIGNIFICANT GROWTH Performed at [facility], [place] [address] [place], [place] [address]    Report Status 04/17/2020 FINAL  Final    Labs: CBC: Recent Labs  Lab 03/21/22 1723 03/22/22 0355 03/23/22 0427 03/24/22 0429  WBC 7.4 7.5 4.8 7.9  NEUTROABS 4.3 5.4  --   --   HGB 13.0 12.2* 11.5* 10.9*  HCT 40.0 37.8* 35.7* 34.2*  MCV 94.6 94.5 94.4 96.1  PLT 218 180 166 PLATELET CLUMPS NOTED ON SMEAR, UNABLE TO ESTIMATE    Basic Metabolic Panel: Recent Labs  Lab 03/21/22 1723 03/22/22 0355 03/23/22 0427 03/24/22 0429  NA 138 137 136 135  K 3.8 4.6 4.2 4.4  CL 104 106 105 106  CO2 24 23 25 22   GLUCOSE 125* 132* 187* 93  BUN 30* 28* 26* 23  CREATININE 1.36* 1.55* 1.33* 1.12  CALCIUM 9.1 8.8* 8.7* 8.3*   Liver Function Tests: Recent Labs  Lab 03/22/22 0355  AST 22  ALT 17  ALKPHOS 81  BILITOT 1.0  PROT 6.8  ALBUMIN 3.5   CBG: No results for input(s): "GLUCAP" in the last 168 hours.  Discharge time spent: greater than 30 minutes.  Signed: Estill Cotta, MD [facility] 03/24/2022

## 2022-03-24 NOTE — Progress Notes (Signed)

## 2022-03-25 DIAGNOSIS — E785 Hyperlipidemia, unspecified: Secondary | ICD-10-CM | POA: Diagnosis not present

## 2022-03-25 DIAGNOSIS — N179 Acute kidney failure, unspecified: Secondary | ICD-10-CM | POA: Diagnosis not present

## 2022-03-25 DIAGNOSIS — Z87442 Personal history of urinary calculi: Secondary | ICD-10-CM | POA: Diagnosis not present

## 2022-03-25 DIAGNOSIS — Z8616 Personal history of COVID-19: Secondary | ICD-10-CM | POA: Diagnosis not present

## 2022-03-25 DIAGNOSIS — Z452 Encounter for adjustment and management of vascular access device: Secondary | ICD-10-CM | POA: Diagnosis not present

## 2022-03-25 DIAGNOSIS — Z8546 Personal history of malignant neoplasm of prostate: Secondary | ICD-10-CM | POA: Diagnosis not present

## 2022-03-25 DIAGNOSIS — N136 Pyonephrosis: Secondary | ICD-10-CM | POA: Diagnosis not present

## 2022-03-25 DIAGNOSIS — B9620 Unspecified Escherichia coli [E. coli] as the cause of diseases classified elsewhere: Secondary | ICD-10-CM | POA: Diagnosis not present

## 2022-03-25 DIAGNOSIS — I700 Atherosclerosis of aorta: Secondary | ICD-10-CM | POA: Diagnosis not present

## 2022-03-25 DIAGNOSIS — M1990 Unspecified osteoarthritis, unspecified site: Secondary | ICD-10-CM | POA: Diagnosis not present

## 2022-03-25 DIAGNOSIS — Z1612 Extended spectrum beta lactamase (ESBL) resistance: Secondary | ICD-10-CM | POA: Diagnosis not present

## 2022-03-25 DIAGNOSIS — Z48816 Encounter for surgical aftercare following surgery on the genitourinary system: Secondary | ICD-10-CM | POA: Diagnosis not present

## 2022-03-25 DIAGNOSIS — Z792 Long term (current) use of antibiotics: Secondary | ICD-10-CM | POA: Diagnosis not present

## 2022-03-25 DIAGNOSIS — Z8673 Personal history of transient ischemic attack (TIA), and cerebral infarction without residual deficits: Secondary | ICD-10-CM | POA: Diagnosis not present

## 2022-03-28 ENCOUNTER — Emergency Department (HOSPITAL_COMMUNITY): Payer: Medicare Other

## 2022-03-28 ENCOUNTER — Other Ambulatory Visit: Payer: Self-pay

## 2022-03-28 ENCOUNTER — Encounter (HOSPITAL_COMMUNITY): Payer: Self-pay | Admitting: Emergency Medicine

## 2022-03-28 ENCOUNTER — Emergency Department (HOSPITAL_COMMUNITY)
Admission: EM | Admit: 2022-03-28 | Discharge: 2022-03-28 | Disposition: A | Discharge status: Home / Self Care | Payer: Medicare Other | Attending: Emergency Medicine | Admitting: Emergency Medicine

## 2022-03-28 DIAGNOSIS — Z8546 Personal history of malignant neoplasm of prostate: Secondary | ICD-10-CM | POA: Insufficient documentation

## 2022-03-28 DIAGNOSIS — R109 Unspecified abdominal pain: Secondary | ICD-10-CM | POA: Insufficient documentation

## 2022-03-28 DIAGNOSIS — Z8616 Personal history of COVID-19: Secondary | ICD-10-CM | POA: Diagnosis not present

## 2022-03-28 DIAGNOSIS — N23 Unspecified renal colic: Secondary | ICD-10-CM | POA: Diagnosis not present

## 2022-03-28 DIAGNOSIS — N1330 Unspecified hydronephrosis: Secondary | ICD-10-CM | POA: Diagnosis not present

## 2022-03-28 LAB — CBC WITH DIFFERENTIAL/PLATELET
Abs Immature Granulocytes: 0.08 10*3/uL — ABNORMAL HIGH (ref 0.00–0.07)
Basophils Absolute: 0.1 10*3/uL (ref 0.0–0.1)
Basophils Relative: 1 %
Eosinophils Absolute: 0.4 10*3/uL (ref 0.0–0.5)
Eosinophils Relative: 5 %
HCT: 36.9 % — ABNORMAL LOW (ref 39.0–52.0)
Hemoglobin: 12 g/dL — ABNORMAL LOW (ref 13.0–17.0)
Immature Granulocytes: 1 %
Lymphocytes Relative: 25 %
Lymphs Abs: 2.1 10*3/uL (ref 0.7–4.0)
MCH: 30.3 pg (ref 26.0–34.0)
MCHC: 32.5 g/dL (ref 30.0–36.0)
MCV: 93.2 fL (ref 80.0–100.0)
Monocytes Absolute: 0.9 10*3/uL (ref 0.1–1.0)
Monocytes Relative: 10 %
Neutro Abs: 4.8 10*3/uL (ref 1.7–7.7)
Neutrophils Relative %: 58 %
Platelets: 229 10*3/uL (ref 150–400)
RBC: 3.96 MIL/uL — ABNORMAL LOW (ref 4.22–5.81)
RDW: 13.4 % (ref 11.5–15.5)
WBC: 8.3 10*3/uL (ref 4.0–10.5)
nRBC: 0 % (ref 0.0–0.2)

## 2022-03-28 LAB — URINALYSIS, ROUTINE W REFLEX MICROSCOPIC
Bilirubin Urine: NEGATIVE
Glucose, UA: NEGATIVE mg/dL
Ketones, ur: 5 mg/dL — AB
Leukocytes,Ua: NEGATIVE
Nitrite: NEGATIVE
Protein, ur: NEGATIVE mg/dL
Specific Gravity, Urine: 1.013 (ref 1.005–1.030)
pH: 6 (ref 5.0–8.0)

## 2022-03-28 LAB — BASIC METABOLIC PANEL
Anion gap: 9 (ref 5–15)
BUN: 19 mg/dL (ref 8–23)
CO2: 24 mmol/L (ref 22–32)
Calcium: 8.8 mg/dL — ABNORMAL LOW (ref 8.9–10.3)
Chloride: 101 mmol/L (ref 98–111)
Creatinine, Ser: 1.55 mg/dL — ABNORMAL HIGH (ref 0.61–1.24)
GFR, Estimated: 46 mL/min — ABNORMAL LOW (ref >60)
Glucose, Bld: 112 mg/dL — ABNORMAL HIGH (ref 70–99)
Potassium: 4.2 mmol/L (ref 3.5–5.1)
Sodium: 134 mmol/L — ABNORMAL LOW (ref 135–145)

## 2022-03-28 LAB — C-REACTIVE PROTEIN: CRP: 2.8 mg/dL — ABNORMAL HIGH (ref <1.0)

## 2022-03-28 LAB — SEDIMENTATION RATE: Sed Rate: 60 mm/hr — ABNORMAL HIGH (ref 0–16)

## 2022-03-28 MED ORDER — ONDANSETRON HCL 4 MG/2ML IJ SOLN
4.0000 mg | Freq: Once | INTRAMUSCULAR | Status: AC
  Administered 2022-03-28: 4 mg via INTRAVENOUS
  Filled 2022-03-28: qty 2

## 2022-03-28 MED ORDER — TAMSULOSIN HCL 0.4 MG PO CAPS: 0.4000 mg | ORAL_CAPSULE | Freq: Every day | ORAL | 0 refills | Status: DC

## 2022-03-28 MED ORDER — HYDROMORPHONE HCL 1 MG/ML IJ SOLN
1.0000 mg | Freq: Once | INTRAMUSCULAR | Status: AC
  Administered 2022-03-28: 1 mg via INTRAVENOUS
  Filled 2022-03-28: qty 1

## 2022-03-28 MED ORDER — HYDROMORPHONE HCL 2 MG PO TABS
2.0000 mg | ORAL_TABLET | Freq: Once | ORAL | Status: AC
  Administered 2022-03-28: 2 mg via ORAL
  Filled 2022-03-28: qty 1

## 2022-03-28 MED ORDER — SODIUM CHLORIDE 0.9 % IV SOLN
1.0000 g | Freq: Once | INTRAVENOUS | Status: AC
  Administered 2022-03-28: 1000 mg via INTRAVENOUS
  Filled 2022-03-28: qty 1

## 2022-03-28 MED ORDER — SODIUM CHLORIDE 0.9% FLUSH
10.0000 mL | INTRAVENOUS | Status: DC | PRN
  Administered 2022-03-28: 10 mL

## 2022-03-28 MED ORDER — HYDROMORPHONE HCL 1 MG/ML IJ SOLN
0.5000 mg | Freq: Once | INTRAMUSCULAR | Status: AC
  Administered 2022-03-28: 0.5 mg via INTRAVENOUS
  Filled 2022-03-28: qty 1

## 2022-03-28 MED ORDER — HYDROMORPHONE HCL 2 MG PO TABS: 2.0000 mg | ORAL_TABLET | Freq: Four times a day (QID) | ORAL | 0 refills | Status: DC | PRN

## 2022-03-28 MED ORDER — SODIUM CHLORIDE 0.9 % IV BOLUS
500.0000 mL | Freq: Once | INTRAVENOUS | Status: AC
  Administered 2022-03-28: 500 mL via INTRAVENOUS

## 2022-03-28 NOTE — ED Notes (Signed)
Pt advises being unable to void at this time due to difficulty urinating, will monitor.

## 2022-03-28 NOTE — ED Triage Notes (Signed)
Pt c/o L flank pain. Took oxycodone around 0130 and tylenol around 0330 without relief. Pt was diagnosed with kidney stone with stent placement and removal recently. Pt has midline to L upper arm and is being treated for E. Coli bacteria with home healthcare. C/o some nausea. States that he feels like something is blocking his urine flow. Denies fever. A&O, VSS.

## 2022-03-28 NOTE — ED Provider Notes (Signed)
[facility] DEPT Provider Note: Shawn Spurling, MD, FACEP  CSN: [id number] MRN: [id number] ARRIVAL: 03/28/22 at [place]: [place]  Flank Pain   HISTORY OF PRESENT ILLNESS  03/28/22 5:39 AM Shawn Walsh is a 77 y.o. male who was seen for flank pain on 03/21/2022 and diagnosed with multiple left ureteral stones.  He underwent lithotripsy the next day with stent placement and subsequent stent removal on 03/23/2022.  He states his left flank pain never went away following surgery, even after removal of the stent, but became uncontrollable this morning even with his oxycodone.  He is also having trouble voiding meaning his urine dribbles rather than flows as a stream.  He rates his pain currently as an 8 out of 10.  He has a midline IV in his left upper arm where he has been receiving daily IV ertapenem infusions for an ESBL E. coli infection in his urine.  That is to continue through 04/01/2022.   Past Medical History:  Diagnosis Date   Arthritis    MILD    COVID-19 virus infection    02-2020   History of kidney stones    HLD (hyperlipidemia)    Nephrolithiasis    per renal ultrasound 01-02-2019 in epic, left 69mm   Prostate cancer 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   Wears glasses     Past Surgical History:  Procedure Laterality Date   ABDOMINAL ADHESION SURGERY  1976   [place]   APPENDECTOMY  1972   COLONOSCOPY  01/06/2002   normal   COLONOSCOPY  01/06/2002   CYSTOSCOPY N/A 05/13/2019   Procedure: CYSTOSCOPY FLEXIBLE;  Surgeon: Festus Aloe, MD;  [facility];  Service: Urology;  Laterality: N/A;  NO SEEDS FOUND IN BLADDER   CYSTOSCOPY/URETEROSCOPY/HOLMIUM LASER/STENT PLACEMENT Left 03/22/2022   Procedure: CYSTOSCOPY/ LEFT RETROGRADE/URETEROSCOPY/HOLMIUM LASER/STENT PLACEMENT;  Surgeon: [name], MD;  Location: [facility];  Service: Urology;  Laterality: Left;   EXTRACORPOREAL  SHOCK WAVE LITHOTRIPSY Left 03/13/2022   Procedure: LEFT EXTRACORPOREAL SHOCK WAVE LITHOTRIPSY (ESWL);  Surgeon: Franchot Gallo, MD;  Location: [facility];  Service: Urology;  Laterality: Left;  75 MINUTES   RADIOACTIVE SEED IMPLANT N/A 05/13/2019   Procedure: RADIOACTIVE SEED IMPLANT/BRACHYTHERAPY IMPLANT;  Surgeon: Festus Aloe, MD;  Location: [facility];  Service: Urology;  Laterality: N/A;   52  SEEDS IMPLANTED   SPACE OAR INSTILLATION N/A 05/13/2019   Procedure: SPACE OAR INSTILLATION;  Surgeon: Festus Aloe, MD;  Location: [facility];  Service: Urology;  Laterality: N/A;    Family History  Problem Relation Age of Onset   Hyperlipidemia Brother    Colon cancer Neg Hx    Prostate cancer Neg Hx    Pancreatic cancer Neg Hx    Breast cancer Neg Hx    Colon polyps Neg Hx    Esophageal cancer Neg Hx     Social History   Tobacco Use   Smoking status: Never   Smokeless tobacco: Never  Vaping Use   Vaping Use: Never used  Substance Use Topics   Alcohol use: No   Drug use: Never    Prior to Admission medications   Medication Sig Start Date End Date Taking? Authorizing Provider  tamsulosin (FLOMAX) 0.4 MG CAPS capsule Take 1 capsule (0.4 mg total) by mouth daily. 03/28/22  Yes Hayden Rasmussen, MD  b complex vitamins capsule Take 1 capsule by mouth  daily.    [provider]  Cholecalciferol (VITAMIN D3) 1000 UNITS CAPS Take by mouth daily.     [provider]  ertapenem (INVANZ) IVPB Inject 1 g into the vein daily for 9 days. Indication:  ESBL UTI First Dose: Yes Last Day of Therapy:  04/01/22 Labs - Once weekly:  CBC/D and BMP, Labs - Every other week:  ESR and CRP Method of administration: Mini-Bag Plus / Gravity Pull PICC/midline at the completion of IV therapy Method of administration may be changed at the discretion of home infusion pharmacist based upon assessment of the patient and/or caregiver's  ability to self-administer the medication ordered. 03/24/22 04/02/22  Rai, Vernelle Emerald, MD  HYDROmorphone (DILAUDID) 2 MG tablet Take 1 tablet (2 mg total) by mouth every 6 (six) hours as needed for severe pain. 03/28/22   Hayden Rasmussen, MD  ketorolac (TORADOL) 10 MG tablet Take 1 tablet (10 mg total) by mouth every 8 (eight) hours as needed for moderate pain (after kidney stone surgery). 03/24/22   [name], MD  Multiple Vitamins-Minerals (CENTRUM SILVER PO) Take 1 tablet by mouth daily.    [provider]  omeprazole (PRILOSEC) 10 MG capsule Take 20 mg by mouth daily.    [provider]  ondansetron (ZOFRAN-ODT) 4 MG disintegrating tablet Take 1 tablet (4 mg total) by mouth every 8 (eight) hours as needed for up to 20 doses for nausea or vomiting. 03/24/22   Rai, Ripudeep K, MD  oxybutynin (DITROPAN) 5 MG tablet Take 5 mg by mouth daily as needed for bladder spasms. 02/02/22   [provider]  polyethylene glycol (MIRALAX / GLYCOLAX) 17 g packet Take 17 g by mouth daily as needed for moderate constipation. Available OTC 03/24/22   Rai, Vernelle Emerald, MD  rosuvastatin (CRESTOR) 5 MG tablet Take 5 mg by mouth daily.    [provider]    Allergies Amoxicillin, Amoxicillin-pot clavulanate, Moxifloxacin, and Nsaids   REVIEW OF SYSTEMS  Negative except as noted here or in the History of Present Illness.   PHYSICAL EXAMINATION  Initial Vital Signs Blood pressure (!) 151/84, pulse 74, temperature 98.6 F (37 C), temperature source Oral, resp. rate 18, height 5\' 8"  (1.727 m), weight 72.1 kg, SpO2 100 %.  Examination General: Well-developed, well-nourished male in no acute distress; appearance consistent with age of record HENT: normocephalic; atraumatic Eyes: Normal appearance Neck: supple Heart: regular rate and rhythm Lungs: clear to auscultation bilaterally Abdomen: soft; nondistended; nontender; bowel sounds present GU: Left CVA  tenderness Extremities: No deformity; full range of motion; pulses normal; midline IV left upper arm Neurologic: Awake, alert and oriented; motor function intact in all extremities and symmetric; no facial droop Skin: Warm and dry Psychiatric: Normal mood and affect   RESULTS  Summary of this visit's results, reviewed and interpreted by myself:   EKG Interpretation  Date/Time:    Ventricular Rate:    PR Interval:    QRS Duration:   QT Interval:    QTC Calculation:   R Axis:     Text Interpretation:         Laboratory Studies: No results found for this or any previous visit (from the past 24 hour(s)).  Imaging Studies: No results found.  ED COURSE and MDM  Nursing notes, initial and subsequent vitals signs, including pulse oximetry, reviewed and interpreted by myself.  Vitals:   03/28/22 0535 03/28/22 0730 03/28/22 0951  BP: (!) 151/84 137/87 (!) 145/80  Pulse: 74 67  64  Resp: 18 16 16   Temp: 98.6 F (37 C)  98.6 F (37 C)  TempSrc: Oral  Oral  SpO2: 100% 96% 100%  Weight: 72.1 kg    Height: 5\' 8"  (1.727 m)     Medications  ondansetron (ZOFRAN) injection 4 mg (4 mg Intravenous Given 03/28/22 0623)  HYDROmorphone (DILAUDID) injection 1 mg (1 mg Intravenous Given 03/28/22 0624)  HYDROmorphone (DILAUDID) tablet 2 mg (2 mg Oral Given 03/28/22 0833)  ertapenem (INVANZ) 1,000 mg in sodium chloride 0.9 % 100 mL IVPB (0 mg Intravenous Stopped 03/28/22 0904)  sodium chloride 0.9 % bolus 500 mL (0 mLs Intravenous Stopped 03/28/22 0936)  HYDROmorphone (DILAUDID) injection 0.5 mg (0.5 mg Intravenous Given 03/28/22 1030)  HYDROmorphone (DILAUDID) injection 0.5 mg (0.5 mg Intravenous Given 03/28/22 1108)   6:57 AM Signed out to Dr. Melina Copa. CT and other diagnostic studies pending.    PROCEDURES  Procedures   ED DIAGNOSES     ICD-10-CM   1. Left flank pain  R10.9          Carzell Saldivar, MD 03/30/22 1044

## 2022-03-28 NOTE — H&P (Addendum)
H&P Physician requesting consult: Aletta Edouard  Chief Complaint: Left hydronephrosis  History of Present Illness: 78 year old male underwent limited ESWL.  This was on 3/11.  There was concern for potentially NSAID use.  He subsequently had refractory pain and therefore underwent left ureteroscopy with laser lithotripsy and ureteral stent placement.  Stent was left on the tether.  Stent was removed the following day.  Patient presented today with persistent pain and discomfort.  He is receiving IV air dependent at home and is on day 3 of that.  He denies any fever, nausea, vomiting.  Pain-free upon my assessment.  CT scan was repeated which showed persistent left hydroureteronephrosis down to the level of the bladder without visible calculus.  Creatinine 1.55, no leukocytosis.  Urinalysis negative leukocyte, negative nitrite, rare bacteria, 0-5 WBC.  Afebrile with vital signs stable.  Past Medical History:  Diagnosis Date   Arthritis    MILD    COVID-19 virus infection    02-2020   History of kidney stones    HLD (hyperlipidemia)    Nephrolithiasis    per renal ultrasound 01-02-2019 in epic, left 63mm   Prostate cancer [facility]) urologist-- dr eskridge/  dr Tammi Klippel   dx 01-14-2019, Stage T1, Gleason 4+5   Wears glasses    Past Surgical History:  Procedure Laterality Date   ABDOMINAL ADHESION SURGERY  1976   [place]   APPENDECTOMY  1972   COLONOSCOPY  01/06/2002   normal   COLONOSCOPY  01/06/2002   CYSTOSCOPY N/A 05/13/2019   Procedure: CYSTOSCOPY FLEXIBLE;  Surgeon: Festus Aloe, MD;  Location: [facility];  Service: Urology;  Laterality: N/A;  NO SEEDS FOUND IN BLADDER   CYSTOSCOPY/URETEROSCOPY/HOLMIUM LASER/STENT PLACEMENT Left 03/22/2022   Procedure: CYSTOSCOPY/ LEFT RETROGRADE/URETEROSCOPY/HOLMIUM LASER/STENT PLACEMENT;  Surgeon: [name], MD;  Location: [facility];  Service: Urology;  Laterality: Left;   EXTRACORPOREAL SHOCK WAVE LITHOTRIPSY Left 03/13/2022    Procedure: LEFT EXTRACORPOREAL SHOCK WAVE LITHOTRIPSY (ESWL);  Surgeon: Franchot Gallo, MD;  Location: [facility];  Service: Urology;  Laterality: Left;  75 MINUTES   RADIOACTIVE SEED IMPLANT N/A 05/13/2019   Procedure: RADIOACTIVE SEED IMPLANT/BRACHYTHERAPY IMPLANT;  Surgeon: Festus Aloe, MD;  Location: [facility];  Service: Urology;  Laterality: N/A;   73  SEEDS IMPLANTED   SPACE OAR INSTILLATION N/A 05/13/2019   Procedure: SPACE OAR INSTILLATION;  Surgeon: Festus Aloe, MD;  Location: [facility];  Service: Urology;  Laterality: N/A;    Home Medications:  (Not in a hospital admission)  Allergies:  Allergies  Allergen Reactions   Amoxicillin     Other reaction(s): vomiting   Amoxicillin-Pot Clavulanate Nausea And Vomiting   Moxifloxacin Nausea And Vomiting   Nsaids     Other reaction(s): elevated kidney  test    Family History  Problem Relation Age of Onset   Hyperlipidemia Brother    Colon cancer Neg Hx    Prostate cancer Neg Hx    Pancreatic cancer Neg Hx    Breast cancer Neg Hx    Colon polyps Neg Hx    Esophageal cancer Neg Hx    Social History:  reports that he has never smoked. He has never used smokeless tobacco. He reports that he does not drink alcohol and does not use drugs.  ROS: A complete review of systems was performed.  All systems are negative except for pertinent findings as noted. ROS   Physical Exam:  Vital signs in last 24 hours: Temp:  [98.6  F (37 C)] 98.6 F (37 C) (03/26 0535) Pulse Rate:  [74] 74 (03/26 0535) Resp:  [18] 18 (03/26 0535) BP: (151)/(84) 151/84 (03/26 0535) SpO2:  [100 %] 100 % (03/26 0535) Weight:  [72.1 kg] 72.1 kg (03/26 0535) General:  Alert and oriented, No acute distress HEENT: Normocephalic, atraumatic Neck: No JVD or lymphadenopathy Cardiovascular: Regular rate and rhythm Lungs: Regular rate and effort Abdomen: Soft, nontender, nondistended, no abdominal  masses Back: No CVA tenderness Extremities: No edema Neurologic: Grossly intact  Laboratory Data:  Results for orders placed or performed during the hospital encounter of 03/28/22 (from the past 24 hour(s))  CBC with Differential     Status: Abnormal   Collection Time: 03/28/22  6:27 AM  Result Value Ref Range   WBC 8.3 4.0 - 10.5 K/uL   RBC 3.96 (L) 4.22 - 5.81 MIL/uL   Hemoglobin 12.0 (L) 13.0 - 17.0 g/dL   HCT 36.9 (L) 39.0 - 52.0 %   MCV 93.2 80.0 - 100.0 fL   MCH 30.3 26.0 - 34.0 pg   MCHC 32.5 30.0 - 36.0 g/dL   RDW 13.4 11.5 - 15.5 %   Platelets 229 150 - 400 K/uL   nRBC 0.0 0.0 - 0.2 %   Neutrophils Relative % 58 %   Neutro Abs 4.8 1.7 - 7.7 K/uL   Lymphocytes Relative 25 %   Lymphs Abs 2.1 0.7 - 4.0 K/uL   Monocytes Relative 10 %   Monocytes Absolute 0.9 0.1 - 1.0 K/uL   Eosinophils Relative 5 %   Eosinophils Absolute 0.4 0.0 - 0.5 K/uL   Basophils Relative 1 %   Basophils Absolute 0.1 0.0 - 0.1 K/uL   Immature Granulocytes 1 %   Abs Immature Granulocytes 0.08 (H) 0.00 - 0.07 K/uL  Basic metabolic panel     Status: Abnormal   Collection Time: 03/28/22  6:27 AM  Result Value Ref Range   Sodium 134 (L) 135 - 145 mmol/L   Potassium 4.2 3.5 - 5.1 mmol/L   Chloride 101 98 - 111 mmol/L   CO2 24 22 - 32 mmol/L   Glucose, Bld 112 (H) 70 - 99 mg/dL   BUN 19 8 - 23 mg/dL   Creatinine, Ser 1.55 (H) 0.61 - 1.24 mg/dL   Calcium 8.8 (L) 8.9 - 10.3 mg/dL   GFR, Estimated 46 (L) >60 mL/min   Anion gap 9 5 - 15   No results found for this or any previous visit (from the past 240 hour(s)). Creatinine: Recent Labs    03/21/22 1723 03/22/22 0355 03/23/22 0427 03/24/22 0429 03/28/22 0627  CREATININE 1.36* 1.55* 1.33* 1.12 1.55*   CT scan personally reviewed and is detailed in the history of present illness  Impression/Assessment:  Left hydronephrosis Left renal colic  Plan:  Continue supportive care.  Recommend NSAID use, agree with changing to Dilaudid to see how  he does on that, and start tamsulosin for ureteral relaxation.  If he fails this, may need a left ureteral stent ultimately.  Hopefully with supportive measures, the ureteral edema will improve on its own.  I will try to get him set up for earlier follow-up hopefully on Thursday if there is availability.  Otherwise, he is scheduled on 4/2.  He will ultimately need a renal ultrasound to ensure resolution of hydronephrosis.  Marton Redwood, III 03/28/2022, 8:28 AM

## 2022-03-28 NOTE — Discharge Instructions (Addendum)
You were seen in the emergency department with worsening left-sided pain after recent lithotripsy.  You had a CAT scan that did not show any retained stones but you still have enlargement of the tube going from the kidney to the bladder.  Please stop the hydrocodone and we are prescribing you some hydromorphone.  You can use 1 tablet every 6 hours as needed.  You can also use ibuprofen.  Monitor for fever and constipation.  Follow-up with [facility] or return to the emergency department if worsening or concerning symptoms

## 2022-03-28 NOTE — ED Provider Notes (Signed)
Signout from Dr. Florina Ou.  77 year old male status post lithotripsy and stent/stent removal here with intractable pain despite oxycodone every 6 hours.  He is also receiving IV ertapenem daily on day 3.  No fever.  Plan is to follow-up on response to pain medication. Physical Exam  BP (!) 151/84 (BP Location: Left Arm)   Pulse 74   Temp 98.6 F (37 C) (Oral)   Resp 18   Ht 5\' 8"  (1.727 m)   Wt 72.1 kg   SpO2 100%   BMI 24.17 kg/m   Physical Exam  Procedures  Procedures  ED Course / MDM    Medical Decision Making Amount and/or Complexity of Data Reviewed Labs: ordered. Radiology: ordered.  Risk Prescription drug management.   Patient states his pain is adequately controlled.  Michela Pitcher has not been eating or feeling the need to eat and has been nauseous a lot.  CT shows persistent hydro although no definite stone.  Reviewed workup with Dr. Gloriann Loan urology who does not feel he warrants repeat stenting at this time and wishes patient continue to try symptomatic treatment at home.  8 AM.  Patient was seen by Dr. Gloriann Loan urology.  He spoke with me afterwards and reassured patient.  He asked if I can prescribe him some Flomax.  Will also change his oxycodone to Dilaudid.  Will give IV dose of his ertapenem here to keep him on schedule.  11:30 AM.  Patient's pain is adequately controlled.  He understands change in medication and indications for return.  He and his wife are comfortable with plan.     Hayden Rasmussen, MD 03/28/22 [id number]

## 2022-03-30 DIAGNOSIS — N136 Pyonephrosis: Secondary | ICD-10-CM | POA: Diagnosis not present

## 2022-03-30 DIAGNOSIS — Z1612 Extended spectrum beta lactamase (ESBL) resistance: Secondary | ICD-10-CM | POA: Diagnosis not present

## 2022-03-30 DIAGNOSIS — I700 Atherosclerosis of aorta: Secondary | ICD-10-CM | POA: Diagnosis not present

## 2022-03-30 DIAGNOSIS — N179 Acute kidney failure, unspecified: Secondary | ICD-10-CM | POA: Diagnosis not present

## 2022-03-30 DIAGNOSIS — Z48816 Encounter for surgical aftercare following surgery on the genitourinary system: Secondary | ICD-10-CM | POA: Diagnosis not present

## 2022-03-30 DIAGNOSIS — B9620 Unspecified Escherichia coli [E. coli] as the cause of diseases classified elsewhere: Secondary | ICD-10-CM | POA: Diagnosis not present

## 2022-04-02 DIAGNOSIS — I700 Atherosclerosis of aorta: Secondary | ICD-10-CM | POA: Diagnosis not present

## 2022-04-02 DIAGNOSIS — B9620 Unspecified Escherichia coli [E. coli] as the cause of diseases classified elsewhere: Secondary | ICD-10-CM | POA: Diagnosis not present

## 2022-04-02 DIAGNOSIS — Z48816 Encounter for surgical aftercare following surgery on the genitourinary system: Secondary | ICD-10-CM | POA: Diagnosis not present

## 2022-04-02 DIAGNOSIS — Z1612 Extended spectrum beta lactamase (ESBL) resistance: Secondary | ICD-10-CM | POA: Diagnosis not present

## 2022-04-02 DIAGNOSIS — N179 Acute kidney failure, unspecified: Secondary | ICD-10-CM | POA: Diagnosis not present

## 2022-04-02 DIAGNOSIS — N136 Pyonephrosis: Secondary | ICD-10-CM | POA: Diagnosis not present

## 2022-04-04 ENCOUNTER — Ambulatory Visit (INDEPENDENT_AMBULATORY_CARE_PROVIDER_SITE_OTHER): Payer: Medicare Other | Admitting: Infectious Diseases

## 2022-04-04 ENCOUNTER — Encounter: Payer: Self-pay | Admitting: Infectious Diseases

## 2022-04-04 ENCOUNTER — Other Ambulatory Visit: Payer: Self-pay

## 2022-04-04 VITALS — BP 129/77 | HR 79 | Resp 16 | Ht 68.0 in | Wt 155.0 lb

## 2022-04-04 DIAGNOSIS — Z1612 Extended spectrum beta lactamase (ESBL) resistance: Secondary | ICD-10-CM | POA: Diagnosis not present

## 2022-04-04 DIAGNOSIS — A499 Bacterial infection, unspecified: Secondary | ICD-10-CM

## 2022-04-04 DIAGNOSIS — N390 Urinary tract infection, site not specified: Secondary | ICD-10-CM | POA: Diagnosis not present

## 2022-04-04 DIAGNOSIS — Z79899 Other long term (current) drug therapy: Secondary | ICD-10-CM | POA: Diagnosis not present

## 2022-04-04 DIAGNOSIS — N1330 Unspecified hydronephrosis: Secondary | ICD-10-CM | POA: Diagnosis not present

## 2022-04-04 NOTE — Progress Notes (Addendum)
Patient Active Problem List   Diagnosis Date Noted   UTI (urinary tract infection) 03/22/2022   Ureteral stone with hydronephrosis 03/22/2022   Ureterolithiasis 03/21/2022   AKI (acute kidney injury) 03/21/2022   HLD (hyperlipidemia) 03/21/2022   Malignant neoplasm of prostate 02/18/2019   Impacted cerumen of both ears 11/19/2017   Dyspepsia 02/14/2011    Subjective: 77 year old male with PMH as below including nephrolithiasis status post Left sided incomplete ESWL attempt 3/11, prostate cancer s/p brachytherapy and completion of tx per his report who is here for HFU for renal colic/Complicated UTI. 0000000 Status post cystoscopy, bilateral retrograde pyelogram, left ureteroscopy with laser lithotripsy and insertion of left ureteral stent. 3/21 left sided stent removed. 3/15 Urine cx ESBL E coli from [facility]. Patient was discharged with IV ertapenem to complete 10 days course, EOT 04/02/22  Interval events Seen in the ED [id number] for renal colic. CT abdomen/pelvis 3/26 left hydroureteronephrosis. UA unremarkable for UTI. Seen by Urology and analgesics changed to dilaudid resulting in improved pain. End date of IV abtx 3/31. Midline has been removed. Denies any pain, fevers, chills or GU symptoms except some difficulty with urination. He has an upcoming appt with Urology.   Review of Systems: all systems reviewed with pertinent positives and negatives as listed above  Past Medical History:  Diagnosis Date   Arthritis    MILD    COVID-19 virus infection    02-2020   History of kidney stones    HLD (hyperlipidemia)    Nephrolithiasis    per renal ultrasound 01-02-2019 in epic, left 44mm   Prostate cancer urologist-- dr eskridge/  dr Tammi Klippel   dx 01-14-2019, Stage T1, Gleason 4+5   Wears glasses    Past Surgical History:  Procedure Laterality Date   ABDOMINAL ADHESION SURGERY  1976   [place]   APPENDECTOMY  1972   COLONOSCOPY  01/06/2002   normal   COLONOSCOPY  01/06/2002    CYSTOSCOPY N/A 05/13/2019   Procedure: CYSTOSCOPY FLEXIBLE;  Surgeon: Festus Aloe, MD;  Location: [facility];  Service: Urology;  Laterality: N/A;  NO SEEDS FOUND IN BLADDER   CYSTOSCOPY/URETEROSCOPY/HOLMIUM LASER/STENT PLACEMENT Left 03/22/2022   Procedure: CYSTOSCOPY/ LEFT RETROGRADE/URETEROSCOPY/HOLMIUM LASER/STENT PLACEMENT;  Surgeon: [name], MD;  Location: [facility];  Service: Urology;  Laterality: Left;   EXTRACORPOREAL SHOCK WAVE LITHOTRIPSY Left 03/13/2022   Procedure: LEFT EXTRACORPOREAL SHOCK WAVE LITHOTRIPSY (ESWL);  Surgeon: Franchot Gallo, MD;  Location: [facility];  Service: Urology;  Laterality: Left;  75 MINUTES   RADIOACTIVE SEED IMPLANT N/A 05/13/2019   Procedure: RADIOACTIVE SEED IMPLANT/BRACHYTHERAPY IMPLANT;  Surgeon: Festus Aloe, MD;  Location: [facility];  Service: Urology;  Laterality: N/A;   59  SEEDS IMPLANTED   SPACE OAR INSTILLATION N/A 05/13/2019   Procedure: SPACE OAR INSTILLATION;  Surgeon: Festus Aloe, MD;  Location: [facility];  Service: Urology;  Laterality: N/A;     Social History   Tobacco Use   Smoking status: Never   Smokeless tobacco: Never  Vaping Use   Vaping Use: Never used  Substance Use Topics   Alcohol use: No   Drug use: Never    Family History  Problem Relation Age of Onset   Hyperlipidemia Brother    Colon cancer Neg Hx    Prostate cancer Neg Hx    Pancreatic cancer Neg Hx    Breast cancer Neg Hx    Colon polyps Neg Hx    Esophageal  cancer Neg Hx     Allergies  Allergen Reactions   Amoxicillin     Other reaction(s): vomiting   Amoxicillin-Pot Clavulanate Nausea And Vomiting   Moxifloxacin Nausea And Vomiting   Nsaids     Other reaction(s): elevated kidney  test    Health Maintenance  Topic Date Due   COVID-19 Vaccine (1) Never done   Hepatitis C Screening  Never done   DTaP/Tdap/Td (1 - Tdap) Never done   Zoster Vaccines- Shingrix  (1 of 2) Never done   Pneumonia Vaccine 57+ Years old (1 of 1 - PCV) Never done   Medicare Annual Wellness (AWV)  01/24/2022   INFLUENZA VACCINE  08/03/2022   HPV VACCINES  Aged Out   COLONOSCOPY (Pts 45-70yrs Insurance coverage will need to be confirmed)  Discontinued    Objective: BP 129/77   Pulse 79   Resp 16   Ht 5\' 8"  (1.727 m)   Wt 155 lb (70.3 kg)   SpO2 96%   BMI 23.57 kg/m    Physical Exam Constitutional:      Appearance: Normal appearance.  HENT:     Head: Normocephalic and atraumatic.      Mouth: Mucous membranes are moist.  Eyes:    Conjunctiva/sclera: Conjunctivae normal.     Pupils: Pupils are equal, round, and reactive to light.   Cardiovascular:     Rate and Rhythm: Normal rate and regular rhythm.     Heart sounds:    Pulmonary:     Effort: Pulmonary effort is normal.     Breath sounds:    Abdominal:     General: Non distended     Palpations: soft.   Musculoskeletal:        General: Normal range of motion.   Skin:    General: Skin is warm and dry.     Comments:  Neurological:     General: grossly non focal     Mental Status: awake, alert and oriented to person, place, and time.   Psychiatric:        Mood and Affect: Mood normal.   Lab Results Lab Results  Component Value Date   WBC 8.3 03/28/2022   HGB 12.0 (L) 03/28/2022   HCT 36.9 (L) 03/28/2022   MCV 93.2 03/28/2022   PLT 229 03/28/2022    Lab Results  Component Value Date   CREATININE 1.55 (H) 03/28/2022   BUN 19 03/28/2022   NA 134 (L) 03/28/2022   K 4.2 03/28/2022   CL 101 03/28/2022   CO2 24 03/28/2022    Lab Results  Component Value Date   ALT 17 03/22/2022   AST 22 03/22/2022   ALKPHOS 81 03/22/2022   BILITOT 1.0 03/22/2022    No results found for: "CHOL", "HDL", "LDLCALC", "LDLDIRECT", "TRIG", "CHOLHDL" No results found for: "LABRPR", "RPRTITER" No results found for: "HIV1RNAQUANT", "HIV1RNAVL", "CD4TABS"   Microbiology  Results for orders placed or  performed during the hospital encounter of 04/15/20  Urine culture     Status: Abnormal   Collection Time: 04/15/20 11:01 PM   Specimen: Urine, Random  Result Value Ref Range Status   Specimen Description   Final    URINE, RANDOM Performed at [facility], [address] [place], [place] [address]    Special Requests   Final    NONE Performed at [facility], [address] [place], [place] [address]    Culture (A)  Final    <10,000  COLONIES/mL INSIGNIFICANT GROWTH Performed at [facility] [address] [place], [place] [address]    Report Status 04/17/2020 FINAL  Final   Imaging 03/28/22 CT abdomen/pelvis FINDINGS: Lower chest:  No contributory findings.   Hepatobiliary: No focal liver abnormality.No evidence of biliary obstruction or stone.   Pancreas: Unremarkable.   Spleen: Unremarkable.   Adrenals/Urinary Tract: Negative adrenals. Left hydroureteronephrosis to the level of the bladder. No visible calculus. No evidence of renal lesion or discrete hemorrhage. No clear bladder lesion, but limited without contrast.   Stomach/Bowel: A few distended small bowel loops but no generalized obstructive pattern. No visible bowel inflammation.   Vascular/Lymphatic: No acute vascular abnormality. No mass or adenopathy.   Reproductive:Brachytherapy seeds in the prostate bed.   Other: No ascites or pneumoperitoneum.   Musculoskeletal: No acute abnormalities.   IMPRESSION: Left hydroureteronephrosis without visible cause  Assessment/Plan 77 year old male with PMH as below including nephrolithiasis status post Left sided incomplete ESWL attempt 3/11, prostate cancer s/p brachytherapy and completion of tx per his report with    # Complicated UTI  0000000 Status post cystoscopy, bilateral retrograde pyelogram, left ureteroscopy with laser lithotripsy and insertion of left ureteral stent 3/21 left sided stent removed  3/15 Urine cx ESBL E coli  from [facility]   Completed IV abtx as recommended  Fu as needed   # Renal colic # Left hydronephrosis Fu with Urology as instructed, plan for repeat US noted   # Medication management  3/26 labs reviewed, no labs today  # Midline  Removed    I have personally spent 45 minutes involved in face-to-face and non-face-to-face activities for this patient on the day of the visit. Professional time spent includes the following activities: Preparing to see the patient (review of tests), Obtaining and/or reviewing separately obtained history (admission/discharge record), Performing a medically appropriate examination and/or evaluation , Ordering medications/tests/procedures, referring and communicating with other health care professionals, Documenting clinical information in the EMR, Independently interpreting results (not separately reported), Communicating results to the patient/family/caregiver, Counseling and educating the patient/family/caregiver and Care coordination (not separately reported).   Wilber Oliphant, [facility] 04/04/2022, 3:33 PM

## 2022-04-05 ENCOUNTER — Inpatient Hospital Stay: Payer: Medicare Other | Admitting: Infectious Diseases

## 2022-04-07 DIAGNOSIS — N201 Calculus of ureter: Secondary | ICD-10-CM | POA: Diagnosis not present

## 2022-05-03 DIAGNOSIS — E349 Endocrine disorder, unspecified: Secondary | ICD-10-CM | POA: Diagnosis not present

## 2022-05-04 ENCOUNTER — Emergency Department (HOSPITAL_COMMUNITY): Payer: Medicare Other

## 2022-05-04 ENCOUNTER — Encounter (HOSPITAL_COMMUNITY): Payer: Self-pay

## 2022-05-04 ENCOUNTER — Other Ambulatory Visit: Payer: Self-pay

## 2022-05-04 ENCOUNTER — Inpatient Hospital Stay (HOSPITAL_COMMUNITY): Payer: Medicare Other

## 2022-05-04 ENCOUNTER — Inpatient Hospital Stay (HOSPITAL_COMMUNITY)
Admission: EM | Admit: 2022-05-04 | Discharge: 2022-05-06 | DRG: 389 | Disposition: A | Discharge status: Home / Self Care | Payer: Medicare Other | Attending: Student | Admitting: Student

## 2022-05-04 DIAGNOSIS — N1330 Unspecified hydronephrosis: Secondary | ICD-10-CM | POA: Diagnosis present

## 2022-05-04 DIAGNOSIS — Z79899 Other long term (current) drug therapy: Secondary | ICD-10-CM | POA: Diagnosis not present

## 2022-05-04 DIAGNOSIS — N134 Hydroureter: Secondary | ICD-10-CM | POA: Diagnosis not present

## 2022-05-04 DIAGNOSIS — N201 Calculus of ureter: Secondary | ICD-10-CM | POA: Diagnosis not present

## 2022-05-04 DIAGNOSIS — R739 Hyperglycemia, unspecified: Secondary | ICD-10-CM | POA: Diagnosis present

## 2022-05-04 DIAGNOSIS — Z8616 Personal history of COVID-19: Secondary | ICD-10-CM | POA: Diagnosis not present

## 2022-05-04 DIAGNOSIS — Z923 Personal history of irradiation: Secondary | ICD-10-CM | POA: Diagnosis not present

## 2022-05-04 DIAGNOSIS — Z960 Presence of urogenital implants: Secondary | ICD-10-CM | POA: Diagnosis present

## 2022-05-04 DIAGNOSIS — K56609 Unspecified intestinal obstruction, unspecified as to partial versus complete obstruction: Secondary | ICD-10-CM | POA: Diagnosis not present

## 2022-05-04 DIAGNOSIS — Z87442 Personal history of urinary calculi: Secondary | ICD-10-CM

## 2022-05-04 DIAGNOSIS — R1110 Vomiting, unspecified: Secondary | ICD-10-CM | POA: Diagnosis not present

## 2022-05-04 DIAGNOSIS — Z881 Allergy status to other antibiotic agents status: Secondary | ICD-10-CM | POA: Diagnosis not present

## 2022-05-04 DIAGNOSIS — Z886 Allergy status to analgesic agent status: Secondary | ICD-10-CM

## 2022-05-04 DIAGNOSIS — Z880 Allergy status to penicillin: Secondary | ICD-10-CM | POA: Diagnosis not present

## 2022-05-04 DIAGNOSIS — Z83438 Family history of other disorder of lipoprotein metabolism and other lipidemia: Secondary | ICD-10-CM

## 2022-05-04 DIAGNOSIS — I700 Atherosclerosis of aorta: Secondary | ICD-10-CM | POA: Diagnosis not present

## 2022-05-04 DIAGNOSIS — K56600 Partial intestinal obstruction, unspecified as to cause: Secondary | ICD-10-CM | POA: Diagnosis not present

## 2022-05-04 DIAGNOSIS — Z4659 Encounter for fitting and adjustment of other gastrointestinal appliance and device: Secondary | ICD-10-CM | POA: Diagnosis not present

## 2022-05-04 DIAGNOSIS — E785 Hyperlipidemia, unspecified: Secondary | ICD-10-CM | POA: Diagnosis present

## 2022-05-04 DIAGNOSIS — N132 Hydronephrosis with renal and ureteral calculous obstruction: Secondary | ICD-10-CM | POA: Diagnosis present

## 2022-05-04 DIAGNOSIS — R109 Unspecified abdominal pain: Secondary | ICD-10-CM | POA: Diagnosis not present

## 2022-05-04 DIAGNOSIS — Z8546 Personal history of malignant neoplasm of prostate: Secondary | ICD-10-CM | POA: Diagnosis not present

## 2022-05-04 DIAGNOSIS — M1990 Unspecified osteoarthritis, unspecified site: Secondary | ICD-10-CM | POA: Diagnosis present

## 2022-05-04 DIAGNOSIS — K6389 Other specified diseases of intestine: Secondary | ICD-10-CM | POA: Diagnosis not present

## 2022-05-04 DIAGNOSIS — E7849 Other hyperlipidemia: Secondary | ICD-10-CM | POA: Diagnosis not present

## 2022-05-04 DIAGNOSIS — K5669 Other partial intestinal obstruction: Secondary | ICD-10-CM | POA: Diagnosis not present

## 2022-05-04 LAB — CBC WITH DIFFERENTIAL/PLATELET
Abs Immature Granulocytes: 0.02 10*3/uL (ref 0.00–0.07)
Basophils Absolute: 0 10*3/uL (ref 0.0–0.1)
Basophils Relative: 0 %
Eosinophils Absolute: 0.3 10*3/uL (ref 0.0–0.5)
Eosinophils Relative: 3 %
HCT: 42.6 % (ref 39.0–52.0)
Hemoglobin: 13.9 g/dL (ref 13.0–17.0)
Immature Granulocytes: 0 %
Lymphocytes Relative: 15 %
Lymphs Abs: 1.2 10*3/uL (ref 0.7–4.0)
MCH: 30.5 pg (ref 26.0–34.0)
MCHC: 32.6 g/dL (ref 30.0–36.0)
MCV: 93.4 fL (ref 80.0–100.0)
Monocytes Absolute: 0.5 10*3/uL (ref 0.1–1.0)
Monocytes Relative: 6 %
Neutro Abs: 5.8 10*3/uL (ref 1.7–7.7)
Neutrophils Relative %: 76 %
Platelets: 192 10*3/uL (ref 150–400)
RBC: 4.56 MIL/uL (ref 4.22–5.81)
RDW: 14.2 % (ref 11.5–15.5)
WBC: 7.8 10*3/uL (ref 4.0–10.5)
nRBC: 0 % (ref 0.0–0.2)

## 2022-05-04 LAB — BASIC METABOLIC PANEL
Anion gap: 8 (ref 5–15)
BUN: 23 mg/dL (ref 8–23)
CO2: 24 mmol/L (ref 22–32)
Calcium: 9.3 mg/dL (ref 8.9–10.3)
Chloride: 106 mmol/L (ref 98–111)
Creatinine, Ser: 1.07 mg/dL (ref 0.61–1.24)
GFR, Estimated: >60 mL/min (ref >60)
Glucose, Bld: 127 mg/dL — ABNORMAL HIGH (ref 70–99)
Potassium: 3.9 mmol/L (ref 3.5–5.1)
Sodium: 138 mmol/L (ref 135–145)

## 2022-05-04 MED ORDER — ENOXAPARIN SODIUM 40 MG/0.4ML IJ SOSY
40.0000 mg | PREFILLED_SYRINGE | INTRAMUSCULAR | Status: DC
  Administered 2022-05-04: 40 mg via SUBCUTANEOUS
  Filled 2022-05-04 (×2): qty 0.4

## 2022-05-04 MED ORDER — ONDANSETRON HCL 4 MG/2ML IJ SOLN
4.0000 mg | Freq: Once | INTRAMUSCULAR | Status: AC
  Administered 2022-05-04: 4 mg via INTRAVENOUS
  Filled 2022-05-04: qty 2

## 2022-05-04 MED ORDER — POTASSIUM CHLORIDE IN NACL 20-0.9 MEQ/L-% IV SOLN
INTRAVENOUS | Status: AC
  Filled 2022-05-04 (×2): qty 1000

## 2022-05-04 MED ORDER — IOHEXOL 9 MG/ML PO SOLN
500.0000 mL | ORAL | Status: AC
  Administered 2022-05-04 (×2): 500 mL via ORAL

## 2022-05-04 MED ORDER — ACETAMINOPHEN 325 MG PO TABS: 650.0000 mg | ORAL_TABLET | Freq: Four times a day (QID) | ORAL | Status: DC | PRN

## 2022-05-04 MED ORDER — SODIUM CHLORIDE (PF) 0.9 % IJ SOLN
INTRAMUSCULAR | Status: AC
  Filled 2022-05-04: qty 50

## 2022-05-04 MED ORDER — DIATRIZOATE MEGLUMINE & SODIUM 66-10 % PO SOLN
90.0000 mL | Freq: Once | ORAL | Status: AC
  Administered 2022-05-04: 90 mL via NASOGASTRIC
  Filled 2022-05-04 (×2): qty 90

## 2022-05-04 MED ORDER — ONDANSETRON HCL 4 MG PO TABS: 4.0000 mg | ORAL_TABLET | Freq: Four times a day (QID) | ORAL | Status: DC | PRN

## 2022-05-04 MED ORDER — FENTANYL CITRATE PF 50 MCG/ML IJ SOSY
50.0000 ug | PREFILLED_SYRINGE | Freq: Once | INTRAMUSCULAR | Status: AC
  Administered 2022-05-04: 50 ug via INTRAVENOUS
  Filled 2022-05-04: qty 1

## 2022-05-04 MED ORDER — IOHEXOL 9 MG/ML PO SOLN: 500.0000 mL | ORAL | Status: AC

## 2022-05-04 MED ORDER — ONDANSETRON HCL 4 MG/2ML IJ SOLN: 4.0000 mg | Freq: Four times a day (QID) | INTRAMUSCULAR | Status: DC | PRN

## 2022-05-04 MED ORDER — SODIUM CHLORIDE 0.9 % IV SOLN: INTRAVENOUS | Status: DC

## 2022-05-04 MED ORDER — PANTOPRAZOLE SODIUM 40 MG IV SOLR
40.0000 mg | INTRAVENOUS | Status: DC
  Administered 2022-05-04 – 2022-05-05 (×2): 40 mg via INTRAVENOUS
  Filled 2022-05-04 (×2): qty 10

## 2022-05-04 MED ORDER — ACETAMINOPHEN 650 MG RE SUPP: 650.0000 mg | Freq: Four times a day (QID) | RECTAL | Status: DC | PRN

## 2022-05-04 MED ORDER — IOHEXOL 9 MG/ML PO SOLN
ORAL | Status: AC
  Filled 2022-05-04: qty 1000

## 2022-05-04 MED ORDER — HYDROMORPHONE HCL 1 MG/ML IJ SOLN: 0.5000 mg | INTRAMUSCULAR | Status: DC | PRN

## 2022-05-04 MED ORDER — IOHEXOL 300 MG/ML  SOLN
100.0000 mL | Freq: Once | INTRAMUSCULAR | Status: AC | PRN
  Administered 2022-05-04: 100 mL via INTRAVENOUS

## 2022-05-04 MED ORDER — IOHEXOL 9 MG/ML PO SOLN
ORAL | Status: AC
  Filled 2022-05-04: qty 500

## 2022-05-04 MED ORDER — SODIUM CHLORIDE 0.9 % IV BOLUS
500.0000 mL | Freq: Once | INTRAVENOUS | Status: AC
  Administered 2022-05-04: 500 mL via INTRAVENOUS

## 2022-05-04 NOTE — ED Provider Notes (Signed)
[place] [facility]ERG[facility] DEPARTMENT AT [facility] Provider Note   CSN: [id number] Arrival date & time: 05/04/22  1191     History  Chief Complaint  Patient presents with   Abdominal Pain    Shawn Walsh is a 77 y.o. male.   Abdominal Pain .  Presents abdominal pain.  Lower abdomen.  Has had previous partial small bowel obstruction.  Cleared on its own does not need surgery.  However had pain that developed last night.  Has had some vomiting.  Had bowel movement did not change the pain.  Also recent kidney stones.  No dysuria.  No fevers.    Past Medical History:  Diagnosis Date   Arthritis    MILD    COVID-19 virus infection    02-2020   History of kidney stones    HLD (hyperlipidemia)    Nephrolithiasis    per renal ultrasound 01-02-2019 in epic, left 11mm   Prostate cancer [facility]) urologist-- dr eskridge/  dr Kathrynn Running   dx 01-14-2019, Stage T1, Gleason 4+5   Wears glasses    Past Surgical History:  Procedure Laterality Date   ABDOMINAL ADHESION SURGERY  1976   [place]   APPENDECTOMY  1972   COLONOSCOPY  01/06/2002   normal   COLONOSCOPY  01/06/2002   CYSTOSCOPY N/A 05/13/2019   Procedure: CYSTOSCOPY FLEXIBLE;  Surgeon: Jerilee Field, MD;  Location: [facility];  Service: Urology;  Laterality: N/A;  NO SEEDS FOUND IN BLADDER   CYSTOSCOPY/URETEROSCOPY/HOLMIUM LASER/STENT PLACEMENT Left 03/22/2022   Procedure: CYSTOSCOPY/ LEFT RETROGRADE/URETEROSCOPY/HOLMIUM LASER/STENT PLACEMENT;  Surgeon: Sebastian Ache, MD;  Location: [facility];  Service: Urology;  Laterality: Left;   EXTRACORPOREAL SHOCK WAVE LITHOTRIPSY Left 03/13/2022   Procedure: LEFT EXTRACORPOREAL SHOCK WAVE LITHOTRIPSY (ESWL);  Surgeon: Marcine Matar, MD;  Location: [facility];  Service: Urology;  Laterality: Left;  75 MINUTES   RADIOACTIVE SEED IMPLANT N/A 05/13/2019   Procedure: RADIOACTIVE SEED IMPLANT/BRACHYTHERAPY IMPLANT;  Surgeon: Jerilee Field, MD;  Location:  [facility];  Service: Urology;  Laterality: N/A;   57  SEEDS IMPLANTED   SPACE OAR INSTILLATION N/A 05/13/2019   Procedure: SPACE OAR INSTILLATION;  Surgeon: Jerilee Field, MD;  Location: [facility];  Service: Urology;  Laterality: N/A;   Ra Home Medications Prior to Admission medications   Medication Sig Start Date End Date Taking? Authorizing Provider  b complex vitamins capsule Take 1 capsule by mouth daily.    [provider]  Cholecalciferol (VITAMIN D3) 1000 UNITS CAPS Take by mouth daily.     [provider]  ketorolac (TORADOL) 10 MG tablet Take 1 tablet (10 mg total) by mouth every 8 (eight) hours as needed for moderate pain (after kidney stone surgery). 03/24/22   Loletta Parish., MD  Multiple Vitamins-Minerals (CENTRUM SILVER PO) Take 1 tablet by mouth daily.    [provider]  omeprazole (PRILOSEC) 10 MG capsule Take 20 mg by mouth daily.    [provider]  polyethylene glycol (MIRALAX / GLYCOLAX) 17 g packet Take 17 g by mouth daily as needed for moderate constipation. Available OTC 03/24/22   Rai, Delene Ruffini, MD  rosuvastatin (CRESTOR) 5 MG tablet Take 5 mg by mouth daily.    [provider]  tamsulosin (FLOMAX) 0.4 MG CAPS capsule Take 1 capsule (0.4 mg total) by mouth daily. Patient not taking: Reported on 04/04/2022 03/28/22   Terrilee Files, MD      Allergies  Amoxicillin, Amoxicillin-pot clavulanate, Moxifloxacin, and Nsaids    Review of Systems   Review of Systems  Gastrointestinal:  Positive for abdominal pain.    Physical Exam Updated Vital Signs BP (!) 142/82   Pulse 77   Temp 98.3 F (36.8 C)   Resp 16   Ht 5\' 8"  (1.727 m)   Wt 68.5 kg   SpO2 98%   BMI 22.96 kg/m  Physical Exam HENT:     Head: Atraumatic.  Pulmonary:     Effort: Pulmonary effort is normal.  Abdominal:     Tenderness: There is abdominal tenderness.     Hernia: No hernia is present.      Comments: Mild lower abdominal tenderness without rebound or guarding.  No hernia palpated.  Skin:    Capillary Refill: Capillary refill takes less than 2 seconds.  Neurological:     Mental Status: He is alert.     ED Results / Procedures / Treatments   Labs (all labs ordered are listed, but only abnormal results are displayed) Labs Reviewed  BASIC METABOLIC PANEL - Abnormal; Notable for the following components:      Result Value   Glucose, Bld 127 (*)    All other components within normal limits  CBC WITH DIFFERENTIAL/PLATELET  URINALYSIS, W/ REFLEX TO CULTURE (INFECTION SUSPECTED)    EKG None  Radiology CT ABDOMEN PELVIS W CONTRAST  Result Date: 05/04/2022 CLINICAL DATA:  Abdominal pain and nausea and vomiting. EXAM: CT ABDOMEN AND PELVIS WITH CONTRAST TECHNIQUE: Multidetector CT imaging of the abdomen and pelvis was performed using the standard protocol following bolus administration of intravenous contrast. RADIATION DOSE REDUCTION: This exam was performed according to the departmental dose-optimization program which includes automated exposure control, adjustment of the mA and/or kV according to patient size and/or use of iterative reconstruction technique. CONTRAST:  OMNIPAQUE IOHEXOL 300 MG/ML  SOLN COMPARISON:  03/28/2022 FINDINGS: Lower chest: Cylindrical bronchiectasis in both lower lobes. Mild dependent subsegmental atelectasis or scarring in the posterior basal segments of both lower lobes. Hepatobiliary: Mild focal steatosis in segment 4 of the liver adjacent to falciform ligament. The gallbladder appears unremarkable. No biliary dilatation. Pancreas: Unremarkable Spleen: Unremarkable Adrenals/Urinary Tract: Adrenal glands normal. The kidneys appear unremarkable. No hydronephrosis. There is mild distal left hydroureter. Mildly asymmetrically accentuated density in the distal most portion of the left ureter and left UVJ region as on images 75-77 of series 2 and images 63  through 65 of series 6. On the CT scan of 03/21/2022 the patient had several larger stones in the distal ureter. Possibilities for current appearance may include inflammation from recent stone passage, miniscule residual distal ureteral calculi, or distal ureteral/UVJ tumor. Stomach/Bowel: Mildly dilated loops of small bowel up to 3 cm with scattered air-levels at differing vertical levels in the same loop favoring early or mild small bowel obstruction. Transition between dilated and nondilated bowel is in the right paramedian upper pelvis, with accentuated multifocal mucosal enhancement within the involved small bowel loop on images 76 through 78 of series 6 but without a substantially angulated bowel loops. The ileum distal to the transition has some segments of mildly accentuated mucosal enhancement possibly from local inflammation. I do not see overt inflammation of the terminal ileum. The appearance and location of the transition is very similar to 03/31/2021, with some localized scarring/stenosis of the ileum in the vicinity postulated on that exam. Trace mesenteric edema adjacent to some of the dilated small bowel loops. No hypoenhancing bowel wall. There is  some mild reversal of the location of the SMA and SMV branches approach in the pelvis but no truly swirled mesentery is observed. Vascular/Lymphatic: There is some minimal atheromatous calcification dorsally near the origin of the celiac trunk, without occlusion or high-grade stenosis in the vicinity. Minimal abdominal aortic atherosclerosis. Today's exam was not performed as a CT angiogram but we do not demonstrate an obvious abnormal filling defect in the SMA. Reproductive: Brachytherapy seed implants in the prostate gland. Other: No supplemental non-categorized findings. 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 Mild to moderate degenerative hip arthropathy bilaterally. IMPRESSION: 1. Mildly dilated  loops of small bowel with scattered air-levels at differing levels favoring partial or early small bowel obstruction. Transition between dilated and nondilated bowel is in the right paramedian upper pelvis, similar location and appearance to 03/31/2021 examination, with some accentuated mucosal enhancement within the involved small bowel loop but without a substantially angulated bowel loops. Localized scarring/stenosis of the ileum in the vicinity of the transition was postulated on that exam. 2. Mild distal left hydroureter with some asymmetrically accentuated density in the distal most portion of the left ureter and left UVJ region. On the CT scan of 03/21/2022 the patient had several larger stones in the distal ureter. Possibilities for current appearance may include inflammation from recent stone passage, miniscule residual distal ureteral calculi, or distal ureteral/UVJ tumor. Urology follow up may be warranted. 3. Cylindrical bronchiectasis in both lower lobes. 4. 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 5. Brachytherapy seed implants in the prostate gland. Electronically Signed   By: Gaylyn Rong M.D.   On: 05/04/2022 11:50   DG Abd 2 Views  Result Date: 05/04/2022 CLINICAL DATA:  [id number] Abdominal pain [id number] EXAM: ABDOMEN - 2 VIEW COMPARISON:  March 26, 2022 FINDINGS: There are multiple moderately dilated loops of small bowel in the LEFT hemiabdomen. Represent loop of bowel spans approximately 3.5 cm. There are scattered air-fluid levels on upright imaging. No definitive free air. Moderate colonic stool burden predominately the RIGHT hemicolon. Brachytherapy seeds in the prostate. Visualized lung bases are unremarkable. IMPRESSION: Multiple moderately dilated loops of small bowel with air-fluid levels. Findings are concerning for a developing small bowel obstruction. Consider further evaluation with dedicated CT scan abdomen pelvis with contrast versus serial  abdominal radiographs. Electronically Signed   By: Meda Klinefelter M.D.   On: 05/04/2022 08:08    Procedures Procedures    Medications Ordered in ED Medications  iohexol (OMNIPAQUE) 9 MG/ML oral solution 500 mL ( Oral Canceled Entry 05/04/22 1057)  diatrizoate meglumine-sodium (GASTROGRAFIN) 66-10 % solution 90 mL (has no administration in time range)  ondansetron (ZOFRAN) injection 4 mg (4 mg Intravenous Given 05/04/22 0731)  fentaNYL (SUBLIMAZE) injection 50 mcg (50 mcg Intravenous Given 05/04/22 0838)  sodium chloride 0.9 % bolus 500 mL (0 mLs Intravenous Stopped 05/04/22 0949)  fentaNYL (SUBLIMAZE) injection 50 mcg (50 mcg Intravenous Given 05/04/22 1025)  ondansetron (ZOFRAN) injection 4 mg (4 mg Intravenous Given 05/04/22 1024)  iohexol (OMNIPAQUE) 300 MG/ML solution 100 mL (100 mLs Intravenous Contrast Given 05/04/22 1057)  iohexol (OMNIPAQUE) 9 MG/ML oral solution 500 mL (500 mLs Oral Contrast Given 05/04/22 1000)    ED Course/ Medical Decision Making/ A&P                             Medical Decision Making Amount and/or Complexity  of Data Reviewed Labs: ordered. Radiology: ordered.  Risk Prescription drug management. Decision regarding hospitalization.   Patient with abdominal pain.  Nausea vomiting.  Previous history of partial small bowel obstruction treated nonsurgically.  Also recent kidney stones.  No dysuria.  Will get x-ray and basic blood work.  Will give antiemetics and check urinalysis.  Thank you will get x-ray to evaluate for potential obstructions.  Of note patient has had 3 CT scans over the last 3 months. I reviewed previous CT scans and kidney stone notes.  X-ray does show tensional bowel obstruction.  Lab work reassuring.  No further vomiting.  Also with general surgery.  They request CT scan of the oral and IV contrast to further evaluate.  Medicine admission.  CT scan does show likely small bowel obstruction.  Likely cause from previous radiation changes.  Also  hydroureter potentially is more chronic now.  Will admit to internal medicine.  General surgery has seen patient and NG tube is in place.       Final Clinical Impression(s) / ED Diagnoses Final diagnoses:  Small bowel obstruction [facility])    Rx / DC Orders ED Discharge Orders     None         Benjiman Core, MD 05/04/22 1203

## 2022-05-04 NOTE — ED Triage Notes (Signed)
Pt arrived POV for continued chronic abd pain with n/v that started last night. Pt has hx of dilation of small bowl loops with possible SBO that has not been operated on per notes. Per pt has lesions that could be resulted as mimicking the SBO, has healed with rest. Pt reports abd distended, pt feels bloated. Normal BM this am. VSS, NAD noted.

## 2022-05-04 NOTE — Consult Note (Signed)
Shawn Walsh 1945/04/03  [id number].    Requesting MD: Dr. Benjiman Core Chief Complaint/Reason for Consult: SBO  HPI:  This is a 77 yo male with a history of prostate cancer s/p 25 cycles of radiation to his pelvis, nephrolithiasis, and HLD.  He has seen Dr. Cliffton Asters in the office in 2023 for some chronic intermittent obstructive type symptoms such as N/V, and abdominal bloating.  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  He was given some lifestyle modifications but informed that if his symptoms persisted, he could return to clinic to discuss a diagnostic laparoscopy.    The patient had been doing well until last night after dinner, he developed significant abdominal pain, distention, N/V.  His last BM was yesterday as well as flatus.  He presented to [facility] as his pain was getting worse.  He has been worked up and found to have a psbo on plain film.  CT was ordered, but not read yet.  By my reviewed, he appears to have an SBO with a transition point in his distal small bowel.  His labs are unremarkable.  We have been called to see the patient.  ROS: ROS: see HPI  Family History  Problem Relation Age of Onset   Hyperlipidemia Brother    Colon cancer Neg Hx    Prostate cancer Neg Hx    Pancreatic cancer Neg Hx    Breast cancer Neg Hx    Colon polyps Neg Hx    Esophageal cancer Neg Hx     Past Medical History:  Diagnosis Date   Arthritis    MILD    COVID-19 virus infection    02-2020   History of kidney stones    HLD (hyperlipidemia)    Nephrolithiasis    per renal ultrasound 01-02-2019 in epic, left 11mm   Prostate cancer [facility]) urologist-- dr eskridge/  dr Kathrynn Running   dx 01-14-2019, Stage T1, Gleason 4+5   Wears glasses     Past Surgical History:  Procedure Laterality Date   ABDOMINAL ADHESION SURGERY  1976   [place]   APPENDECTOMY  1972   COLONOSCOPY  01/06/2002   normal   COLONOSCOPY  01/06/2002    CYSTOSCOPY N/A 05/13/2019   Procedure: CYSTOSCOPY FLEXIBLE;  Surgeon: Jerilee Field, MD;  Location: [facility];  Service: Urology;  Laterality: N/A;  NO SEEDS FOUND IN BLADDER   CYSTOSCOPY/URETEROSCOPY/HOLMIUM LASER/STENT PLACEMENT Left 03/22/2022   Procedure: CYSTOSCOPY/ LEFT RETROGRADE/URETEROSCOPY/HOLMIUM LASER/STENT PLACEMENT;  Surgeon: Sebastian Ache, MD;  Location[facility];  Service: Urology;  Laterality: Left;   EXTRACORPOREAL SHOCK WAVE LITHOTRIPSY Left 03/13/2022   Procedure: LEFT EXTRACORPOREAL SHOCK WAVE LITHOTRIPSY (ESWL);  Surgeon: Marcine Matar, MD;  Location: [facility];  Service: Urology;  Laterality: Left;  75 MINUTES   RADIOACTIVE SEED IMPLANT N/A 05/13/2019   Procedure: RADIOACTIVE SEED IMPLANT/BRACHYTHERAPY IMPLANT;  Surgeon: Jerilee Field, MD;  Location: [facility];  Service: Urology;  Laterality: N/A;   57  SEEDS IMPLANTED   SPACE OAR INSTILLATION N/A 05/13/2019   Procedure: SPACE OAR INSTILLATION;  Surgeon: Jerilee Field, MD;  Location: [facility];  Service: Urology;  Laterality: N/A;    Social History:  reports that he has never smoked. He has never been exposed to tobacco smoke. He has never used smokeless tobacco. He reports that he does not drink alcohol and does not use drugs.  Allergies:  Allergies  Allergen Reactions   Amoxicillin     Other reaction(s): vomiting   Amoxicillin-Pot Clavulanate Nausea And Vomiting   Moxifloxacin Nausea And Vomiting   Nsaids     Other reaction(s): elevated kidney  test    (Not in a hospital admission)    Physical Exam: Blood pressure (!) 148/88, pulse 72, temperature 98.3 F (36.8 C), resp. rate 16, height 5\' 8"  (1.727 m), weight 68.5 kg, SpO2 98 %. General: pleasant, WD, WN [place] male who is laying in bed in NAD, but appears nauseated HEENT: head is normocephalic, atraumatic.  Sclera are noninjected.  PERRL.  Ears and nose without any masses or  lesions.  Mouth is pink and moist Heart: regular, rate, and rhythm.  Normal s1,s2. No obvious murmurs, gallops, or rubs noted.  Palpable radial and pedal pulses bilaterally Lungs: CTAB, no wheezes, rhonchi, or rales noted.  Respiratory effort nonlabored Abd: soft, mild diffuse tenderness with distention. Hypoactive BS, no masses, hernias, or organomegaly.  RLQ scar noted from previous surgeries Psych: A&Ox3 with an appropriate affect.   Results for orders placed or performed during the hospital encounter of 05/04/22 (from the past 48 hour(s))  Basic metabolic panel     Status: Abnormal   Collection Time: 05/04/22  7:34 AM  Result Value Ref Range   Sodium 138 135 - 145 mmol/L   Potassium 3.9 3.5 - 5.1 mmol/L   Chloride 106 98 - 111 mmol/L   CO2 24 22 - 32 mmol/L   Glucose, Bld 127 (H) 70 - 99 mg/dL    Comment: Glucose reference range applies only to samples taken after fasting for at least 8 hours.   BUN 23 8 - 23 mg/dL   Creatinine, Ser 1.61 0.61 - 1.24 mg/dL   Calcium 9.3 8.9 - 09.6 mg/dL   GFR, Estimated >04 >54 mL/min    Comment: (NOTE) Calculated using the CKD-EPI Creatinine Equation (2021)    Anion gap 8 5 - 15    Comment: Performed at [facility], [address], [place], [place] [address]  CBC with Differential     Status: None   Collection Time: 05/04/22  7:34 AM  Result Value Ref Range   WBC 7.8 4.0 - 10.5 K/uL   RBC 4.56 4.22 - 5.81 MIL/uL   Hemoglobin 13.9 13.0 - 17.0 g/dL   HCT 91.4 78.2 - 95.6 %   MCV 93.4 80.0 - 100.0 fL   MCH 30.5 26.0 - 34.0 pg   MCHC 32.6 30.0 - 36.0 g/dL   RDW 21.3 08.6 - 57.8 %   Platelets 192 150 - 400 K/uL   nRBC 0.0 0.0 - 0.2 %   Neutrophils Relative % 76 %   Neutro Abs 5.8 1.7 - 7.7 K/uL   Lymphocytes Relative 15 %   Lymphs Abs 1.2 0.7 - 4.0 K/uL   Monocytes Relative 6 %   Monocytes Absolute 0.5 0.1 - 1.0 K/uL   Eosinophils Relative 3 %   Eosinophils Absolute 0.3 0.0 - 0.5 K/uL   Basophils Relative 0 %    Basophils Absolute 0.0 0.0 - 0.1 K/uL   Immature Granulocytes 0 %   Abs Immature Granulocytes 0.02 0.00 - 0.07 K/uL    Comment: Performed at [facility] [address] [place], [place] [address]   DG Abd 2 Views  Result Date: 05/04/2022 CLINICAL DATA:  [id number] Abdominal pain [id number] EXAM: ABDOMEN - 2 VIEW COMPARISON:  March 26, 2022 FINDINGS: There are multiple moderately dilated loops  of small bowel in the LEFT hemiabdomen. Represent loop of bowel spans approximately 3.5 cm. There are scattered air-fluid levels on upright imaging. No definitive free air. Moderate colonic stool burden predominately the RIGHT hemicolon. Brachytherapy seeds in the prostate. Visualized lung bases are unremarkable. IMPRESSION: Multiple moderately dilated loops of small bowel with air-fluid levels. Findings are concerning for a developing small bowel obstruction. Consider further evaluation with dedicated CT scan abdomen pelvis with contrast versus serial abdominal radiographs. Electronically Signed   By: Meda Klinefelter M.D.   On: 05/04/2022 08:08      Assessment/Plan SBO The patient has been seen, examined, chart, labs, vitals, and imaging personally reviewed and appears consistent with a SBO.  He has a history of radiation to his pelvis which could be contributing.  He has also had previous abdominal surgery, so could be adhesive related as well.  Given his distention, we will start by placing an NGT and start the SBO protocol.  If he fails to improve or resolve, he may require surgical intervention.  Agree with admission to the medical service and we will follow along.   FEN - NPO/IVFs/NGT VTE - ok for chemical prophylaxis from our standpoint ID - none needed surgically  Nephrolithiasis H/O prostate cancer, radiation to pelvis HLD  I reviewed ED provider notes, last 24 h vitals and pain scores, last 48 h intake and output, last 24 h labs and trends, and last 24 h imaging results.  Letha Cape, [facility] 05/04/2022, 11:26 AM Please see Amion for pager number during day hours 7:00am-4:30pm or 7:00am -11:30am on weekends

## 2022-05-04 NOTE — ED Notes (Signed)
ED TO INPATIENT HANDOFF REPORT  ED Nurse Name and Phone #: Lona Kettle Name/Age/Gender Shawn Walsh 77 y.o. male Room/Bed: WA15/WA15  Code Status   Code Status: Full Code  Home/SNF/Other Home Patient oriented to: self, place, time, and situation Is this baseline? Yes   Triage Complete: Triage complete  Chief Complaint SBO (small bowel obstruction) (HCC) [K56.609]  Triage Note Pt arrived POV for continued chronic abd pain with n/v that started last night. Pt has hx of dilation of small bowl loops with possible SBO that has not been operated on per notes. Per pt has lesions that could be resulted as mimicking the SBO, has healed with rest. Pt reports abd distended, pt feels bloated. Normal BM this am. VSS, NAD noted.    Allergies Allergies  Allergen Reactions   Amoxicillin     Other reaction(s): vomiting   Amoxicillin-Pot Clavulanate Nausea And Vomiting   Moxifloxacin Nausea And Vomiting   Nsaids     Other reaction(s): elevated kidney  test    Level of Care/Admitting Diagnosis ED Disposition     ED Disposition  Admit   Condition  --   Comment  Hospital Area: [facility] [[id number]]  Level of Care: Med-Surg [16]  May admit patient to [facility] or [facility] if equivalent level of care is available:: No  Covid Evaluation: Asymptomatic - no recent exposure (last 10 days) testing not required  Diagnosis: SBO (small bowel obstruction) [facility]) [161096]  Admitting Physician: [name] [[id number]]  Attending Physician: [name] [[id number]]  Certification:: I certify this patient will need inpatient services for at least 2 midnights  Estimated Length of Stay: 2          B Medical/Surgery History Past Medical History:  Diagnosis Date   Arthritis    MILD    COVID-19 virus infection    02-2020   History of kidney stones    HLD (hyperlipidemia)    Nephrolithiasis    per renal ultrasound 01-02-2019 in epic, left 11mm   Prostate  cancer [facility]) urologist-- dr eskridge/  dr Kathrynn Running   dx 01-14-2019, Stage T1, Gleason 4+5   Wears glasses    Past Surgical History:  Procedure Laterality Date   ABDOMINAL ADHESION SURGERY  1976   [place]   APPENDECTOMY  1972   COLONOSCOPY  01/06/2002   normal   COLONOSCOPY  01/06/2002   CYSTOSCOPY N/A 05/13/2019   Procedure: CYSTOSCOPY FLEXIBLE;  Surgeon: Jerilee Field, MD;  Location: [facility];  Service: Urology;  Laterality: N/A;  NO SEEDS FOUND IN BLADDER   CYSTOSCOPY/URETEROSCOPY/HOLMIUM LASER/STENT PLACEMENT Left 03/22/2022   Procedure: CYSTOSCOPY/ LEFT RETROGRADE/URETEROSCOPY/HOLMIUM LASER/STENT PLACEMENT;  Surgeon: Sebastian Ache, MD;  Location: [facility];  Service: Urology;  Laterality: Left;   EXTRACORPOREAL SHOCK WAVE LITHOTRIPSY Left 03/13/2022   Procedure: LEFT EXTRACORPOREAL SHOCK WAVE LITHOTRIPSY (ESWL);  Surgeon: Marcine Matar, MD;  Location: [facility];  Service: Urology;  Laterality: Left;  75 MINUTES   RADIOACTIVE SEED IMPLANT N/A 05/13/2019   Procedure: RADIOACTIVE SEED IMPLANT/BRACHYTHERAPY IMPLANT;  Surgeon: Jerilee Field, MD;  Location: [facility];  Service: Urology;  Laterality: N/A;   57  SEEDS IMPLANTED   SPACE OAR INSTILLATION N/A 05/13/2019   Procedure: SPACE OAR INSTILLATION;  Surgeon: Jerilee Field, MD;  Location[facility];  Service: Urology;  Laterality: N/A;     A IV Location/Drains/Wounds Patient Lines/Drains/Airways Status     Active Line/Drains/Airways     Name Placement date  Placement time Site Days   Peripheral IV 05/04/22 20 G Anterior;Distal;Left;Upper Arm 05/04/22  0731  Arm  less than 1   NG/OG Vented/Dual Lumen 14 Fr. Left nare Marking at nare/corner of mouth 05/04/22  1234  Left nare  less than 1            Intake/Output Last 24 hours No intake or output data in the 24 hours ending 05/04/22 1527  Labs/Imaging Results for orders placed or performed during the  hospital encounter of 05/04/22 (from the past 48 hour(s))  Basic metabolic panel     Status: Abnormal   Collection Time: 05/04/22  7:34 AM  Result Value Ref Range   Sodium 138 135 - 145 mmol/L   Potassium 3.9 3.5 - 5.1 mmol/L   Chloride 106 98 - 111 mmol/L   CO2 24 22 - 32 mmol/L   Glucose, Bld 127 (H) 70 - 99 mg/dL    Comment: Glucose reference range applies only to samples taken after fasting for at least 8 hours.   BUN 23 8 - 23 mg/dL   Creatinine, Ser 1.61 0.61 - 1.24 mg/dL   Calcium 9.3 8.9 - 09.6 mg/dL   GFR, Estimated >04 >54 mL/min    Comment: (NOTE) Calculated using the CKD-EPI Creatinine Equation (2021)    Anion gap 8 5 - 15    Comment: Performed at [facility] [place], [address], [place], [place] [address]  CBC with Differential     Status: None   Collection Time: 05/04/22  7:34 AM  Result Value Ref Range   WBC 7.8 4.0 - 10.5 K/uL   RBC 4.56 4.22 - 5.81 MIL/uL   Hemoglobin 13.9 13.0 - 17.0 g/dL   HCT 91.4 78.2 - 95.6 %   MCV 93.4 80.0 - 100.0 fL   MCH 30.5 26.0 - 34.0 pg   MCHC 32.6 30.0 - 36.0 g/dL   RDW 21.3 08.6 - 57.8 %   Platelets 192 150 - 400 K/uL   nRBC 0.0 0.0 - 0.2 %   Neutrophils Relative % 76 %   Neutro Abs 5.8 1.7 - 7.7 K/uL   Lymphocytes Relative 15 %   Lymphs Abs 1.2 0.7 - 4.0 K/uL   Monocytes Relative 6 %   Monocytes Absolute 0.5 0.1 - 1.0 K/uL   Eosinophils Relative 3 %   Eosinophils Absolute 0.3 0.0 - 0.5 K/uL   Basophils Relative 0 %   Basophils Absolute 0.0 0.0 - 0.1 K/uL   Immature Granulocytes 0 %   Abs Immature Granulocytes 0.02 0.00 - 0.07 K/uL    Comment: Performed at [facility], [address], [place], [place] [address]   DG Abd Portable 1V-Small Bowel Protocol-Position Verification  Result Date: 05/04/2022 CLINICAL DATA:  Enteric tube placement EXAM: PORTABLE ABDOMEN - 1 VIEW COMPARISON:  Abdominal radiograph dated 03/17/2022 FINDINGS: Gastric/enteric tube tip projects over the stomach.  Partially imaged bowel gas pattern is nonspecific. IMPRESSION: Gastric/enteric tube tip projects over the stomach. Electronically Signed   By: Agustin Cree M.D.   On: 05/04/2022 13:28   CT ABDOMEN PELVIS W CONTRAST  Result Date: 05/04/2022 CLINICAL DATA:  Abdominal pain and nausea and vomiting. EXAM: CT ABDOMEN AND PELVIS WITH CONTRAST TECHNIQUE: Multidetector CT imaging of the abdomen and pelvis was performed using the standard protocol following bolus administration of intravenous contrast. RADIATION DOSE REDUCTION: This exam was performed according to the departmental dose-optimization program which includes automated exposure control, adjustment of the mA and/or kV  according to patient size and/or use of iterative reconstruction technique. CONTRAST:  OMNIPAQUE IOHEXOL 300 MG/ML  SOLN COMPARISON:  03/28/2022 FINDINGS: Lower chest: Cylindrical bronchiectasis in both lower lobes. Mild dependent subsegmental atelectasis or scarring in the posterior basal segments of both lower lobes. Hepatobiliary: Mild focal steatosis in segment 4 of the liver adjacent to falciform ligament. The gallbladder appears unremarkable. No biliary dilatation. Pancreas: Unremarkable Spleen: Unremarkable Adrenals/Urinary Tract: Adrenal glands normal. The kidneys appear unremarkable. No hydronephrosis. There is mild distal left hydroureter. Mildly asymmetrically accentuated density in the distal most portion of the left ureter and left UVJ region as on images 75-77 of series 2 and images 63 through 65 of series 6. On the CT scan of 03/21/2022 the patient had several larger stones in the distal ureter. Possibilities for current appearance may include inflammation from recent stone passage, miniscule residual distal ureteral calculi, or distal ureteral/UVJ tumor. Stomach/Bowel: Mildly dilated loops of small bowel up to 3 cm with scattered air-levels at differing vertical levels in the same loop favoring early or mild small bowel  obstruction. Transition between dilated and nondilated bowel is in the right paramedian upper pelvis, with accentuated multifocal mucosal enhancement within the involved small bowel loop on images 76 through 78 of series 6 but without a substantially angulated bowel loops. The ileum distal to the transition has some segments of mildly accentuated mucosal enhancement possibly from local inflammation. I do not see overt inflammation of the terminal ileum. The appearance and location of the transition is very similar to 03/31/2021, with some localized scarring/stenosis of the ileum in the vicinity postulated on that exam. Trace mesenteric edema adjacent to some of the dilated small bowel loops. No hypoenhancing bowel wall. There is some mild reversal of the location of the SMA and SMV branches approach in the pelvis but no truly swirled mesentery is observed. Vascular/Lymphatic: There is some minimal atheromatous calcification dorsally near the origin of the celiac trunk, without occlusion or high-grade stenosis in the vicinity. Minimal abdominal aortic atherosclerosis. Today's exam was not performed as a CT angiogram but we do not demonstrate an obvious abnormal filling defect in the SMA. Reproductive: Brachytherapy seed implants in the prostate gland. Other: No supplemental non-categorized findings. 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 Mild to moderate degenerative hip arthropathy bilaterally. IMPRESSION: 1. Mildly dilated loops of small bowel with scattered air-levels at differing levels favoring partial or early small bowel obstruction. Transition between dilated and nondilated bowel is in the right paramedian upper pelvis, similar location and appearance to 03/31/2021 examination, with some accentuated mucosal enhancement within the involved small bowel loop but without a substantially angulated bowel loops. Localized scarring/stenosis of the ileum in the  vicinity of the transition was postulated on that exam. 2. Mild distal left hydroureter with some asymmetrically accentuated density in the distal most portion of the left ureter and left UVJ region. On the CT scan of 03/21/2022 the patient had several larger stones in the distal ureter. Possibilities for current appearance may include inflammation from recent stone passage, miniscule residual distal ureteral calculi, or distal ureteral/UVJ tumor. Urology follow up may be warranted. 3. Cylindrical bronchiectasis in both lower lobes. 4. 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 5. Brachytherapy seed implants in the prostate gland. Electronically Signed   By: Gaylyn Rong M.D.   On: 05/04/2022 11:50   DG Abd 2 Views  Result Date: 05/04/2022 CLINICAL  DATA:  [id number] Abdominal pain [id number] EXAM: ABDOMEN - 2 VIEW COMPARISON:  March 26, 2022 FINDINGS: There are multiple moderately dilated loops of small bowel in the LEFT hemiabdomen. Represent loop of bowel spans approximately 3.5 cm. There are scattered air-fluid levels on upright imaging. No definitive free air. Moderate colonic stool burden predominately the RIGHT hemicolon. Brachytherapy seeds in the prostate. Visualized lung bases are unremarkable. IMPRESSION: Multiple moderately dilated loops of small bowel with air-fluid levels. Findings are concerning for a developing small bowel obstruction. Consider further evaluation with dedicated CT scan abdomen pelvis with contrast versus serial abdominal radiographs. Electronically Signed   By: Meda Klinefelter M.D.   On: 05/04/2022 08:08    Pending Labs Unresulted Labs (From admission, onward)     Start     Ordered   05/11/22 0500  Creatinine, serum  (enoxaparin (LOVENOX)    CrCl >/= 30 ml/min)  Weekly,   R     Comments: while on enoxaparin therapy    05/04/22 1215   05/05/22 0500  CBC  Tomorrow morning,   R        05/04/22 1215   05/05/22 0500  Comprehensive metabolic  panel  Tomorrow morning,   R        05/04/22 1215   05/04/22 0720  Urinalysis, w/ Reflex to Culture (Infection Suspected) -Urine, Unspecified Source  Once,   URGENT       Question:  Specimen Source  Answer:  Urine, Unspecified Source   05/04/22 0720            Vitals/Pain Today's Vitals   05/04/22 0838 05/04/22 0930 05/04/22 1145 05/04/22 1347  BP: (!) 152/84 (!) 148/88 (!) 142/82 (!) 159/85  Pulse: 69 72 77 70  Resp: 16 16 16 16   Temp:  98.3 F (36.8 C)  98.1 F (36.7 C)  TempSrc:      SpO2: 98% 98% 98% 98%  Weight:      Height:      PainSc:        Isolation Precautions No active isolations  Medications Medications  iohexol (OMNIPAQUE) 9 MG/ML oral solution 500 mL ( Oral Canceled Entry 05/04/22 1057)  diatrizoate meglumine-sodium (GASTROGRAFIN) 66-10 % solution 90 mL (has no administration in time range)  enoxaparin (LOVENOX) injection 40 mg (has no administration in time range)  acetaminophen (TYLENOL) tablet 650 mg (has no administration in time range)    Or  acetaminophen (TYLENOL) suppository 650 mg (has no administration in time range)  HYDROmorphone (DILAUDID) injection 0.5 mg (has no administration in time range)  ondansetron (ZOFRAN) tablet 4 mg (has no administration in time range)    Or  ondansetron (ZOFRAN) injection 4 mg (has no administration in time range)  pantoprazole (PROTONIX) injection 40 mg (40 mg Intravenous Given 05/04/22 1345)  ondansetron (ZOFRAN) injection 4 mg (4 mg Intravenous Given 05/04/22 0731)  fentaNYL (SUBLIMAZE) injection 50 mcg (50 mcg Intravenous Given 05/04/22 0838)  sodium chloride 0.9 % bolus 500 mL (0 mLs Intravenous Stopped 05/04/22 0949)  fentaNYL (SUBLIMAZE) injection 50 mcg (50 mcg Intravenous Given 05/04/22 1025)  ondansetron (ZOFRAN) injection 4 mg (4 mg Intravenous Given 05/04/22 1024)  iohexol (OMNIPAQUE) 300 MG/ML solution 100 mL (100 mLs Intravenous Contrast Given 05/04/22 1057)  iohexol (OMNIPAQUE) 9 MG/ML oral solution 500 mL (500  mLs Oral Contrast Given 05/04/22 1000)    Mobility walks     Focused Assessments SBO   R Recommendations: See Admitting Provider Note  Report given to:   Additional  Notes: Marland Kitchen

## 2022-05-04 NOTE — H&P (Signed)
History and Physical    Patient: Shawn Walsh ZOX:[id number] DOB: [birth year] DOA: 05/04/2022 DOS: the patient was seen and examined on 05/04/2022 PCP: Daisy Floro, MD  Patient coming from: Home  Chief Complaint:  Chief Complaint  Patient presents with   Abdominal Pain   HPI: Shawn Walsh is a 77 y.o. male with medical history significant of osteoarthritis, COVID-19, nephrolithiasis, hyperlipidemia, prostate cancer, multiple episodes of SBO who presented to the emergency department complaints of abdominal pain, nausea and emesis.  Symptoms are similar to prior SBO instances.  No diarrhea, constipation, melena or hematochezia.  No flank pain, dysuria, frequency or hematuria.  He denied fever, chills, rhinorrhea, sore throat, wheezing or hemoptysis.  No chest pain, palpitations, diaphoresis, PND, orthopnea or pitting edema of the lower extremities. No polyuria, polydipsia, polyphagia or blurred vision.   Lab work: CBC was normal.  BMP showed a glucose of 327 mg/dL, but was otherwise normal.  Imaging: 2 view abdominal x-ray showed multiple moderately dilated loops of small bowel with air-fluid levels concerning for SBO.  CT abdomen/pelvis with contrast confirmed SBO.  There was mild distal left hydroureter with some nonsymmetrical he has not waited density in the distalmost portion of the left ureter and left UVJ region.  There was cylindrical bronchiectasis in both lower lobes.  Please see images and full radiology report for further details.  ED course: Initial vital signs were temperature 98.3 F, pulse 77, respiration 18, BP 145/48 mmHg O2 sat 98% on room air.  Patient received ondansetron 4 mg IVP x 2, fentanyl 50 mcg IVP x 2 and 500 mL of normal saline bolus.  An NG tube was placed after discussion with general surgery.   Review of Systems: As mentioned in the history of present illness. All other systems reviewed and are negative.  Past Medical History:  Diagnosis Date   Arthritis     MILD    COVID-19 virus infection    02-2020   History of kidney stones    HLD (hyperlipidemia)    Nephrolithiasis    per renal ultrasound 01-02-2019 in epic, left 11mm   Prostate cancer [facility]) urologist-- dr eskridge/  dr Kathrynn Running   dx 01-14-2019, Stage T1, Gleason 4+5   Wears glasses    Past Surgical History:  Procedure Laterality Date   ABDOMINAL ADHESION SURGERY  1976   [place]   APPENDECTOMY  1972   COLONOSCOPY  01/06/2002   normal   COLONOSCOPY  01/06/2002   CYSTOSCOPY N/A 05/13/2019   Procedure: CYSTOSCOPY FLEXIBLE;  Surgeon: Jerilee Field, MD;  Location: [facility];  Service: Urology;  Laterality: N/A;  NO SEEDS FOUND IN BLADDER   CYSTOSCOPY/URETEROSCOPY/HOLMIUM LASER/STENT PLACEMENT Left 03/22/2022   Procedure: CYSTOSCOPY/ LEFT RETROGRADE/URETEROSCOPY/HOLMIUM LASER/STENT PLACEMENT;  Surgeon: Sebastian Ache, MD;  Location[facility];  Service: Urology;  Laterality: Left;   EXTRACORPOREAL SHOCK WAVE LITHOTRIPSY Left 03/13/2022   Procedure: LEFT EXTRACORPOREAL SHOCK WAVE LITHOTRIPSY (ESWL);  Surgeon: Marcine Matar, MD;  Location: [facility];  Service: Urology;  Laterality: Left;  75 MINUTES   RADIOACTIVE SEED IMPLANT N/A 05/13/2019   Procedure: RADIOACTIVE SEED IMPLANT/BRACHYTHERAPY IMPLANT;  Surgeon: Jerilee Field, MD;  Location[facility];  Service: Urology;  Laterality: N/A;   57  SEEDS IMPLANTED   SPACE OAR INSTILLATION N/A 05/13/2019   Procedure: SPACE OAR INSTILLATION;  Surgeon: Jerilee Field, MD;  Location[facility];  Service: Urology;  Laterality: N/A;   Social History:  reports that he has never smoked.  He has never been exposed to tobacco smoke. He has never used smokeless tobacco. He reports that he does not drink alcohol and does not use drugs.  Allergies  Allergen Reactions   Amoxicillin     Other reaction(s): vomiting   Amoxicillin-Pot Clavulanate Nausea And Vomiting   Moxifloxacin  Nausea And Vomiting   Nsaids     Other reaction(s): elevated kidney  test    Family History  Problem Relation Age of Onset   Hyperlipidemia Brother    Colon cancer Neg Hx    Prostate cancer Neg Hx    Pancreatic cancer Neg Hx    Breast cancer Neg Hx    Colon polyps Neg Hx    Esophageal cancer Neg Hx     Prior to Admission medications   Medication Sig Start Date End Date Taking? Authorizing Provider  b complex vitamins capsule Take 1 capsule by mouth daily.    [provider]  Cholecalciferol (VITAMIN D3) 1000 UNITS CAPS Take by mouth daily.     [provider]  ketorolac (TORADOL) 10 MG tablet Take 1 tablet (10 mg total) by mouth every 8 (eight) hours as needed for moderate pain (after kidney stone surgery). 03/24/22   Loletta Parish., MD  Multiple Vitamins-Minerals (CENTRUM SILVER PO) Take 1 tablet by mouth daily.    [provider]  omeprazole (PRILOSEC) 10 MG capsule Take 20 mg by mouth daily.    [provider]  polyethylene glycol (MIRALAX / GLYCOLAX) 17 g packet Take 17 g by mouth daily as needed for moderate constipation. Available OTC 03/24/22   Rai, Delene Ruffini, MD  rosuvastatin (CRESTOR) 5 MG tablet Take 5 mg by mouth daily.    [provider]  tamsulosin (FLOMAX) 0.4 MG CAPS capsule Take 1 capsule (0.4 mg total) by mouth daily. Patient not taking: Reported on 04/04/2022 03/28/22   Terrilee Files, MD    Physical Exam: Vitals:   05/04/22 0635 05/04/22 0838 05/04/22 0930 05/04/22 1145  BP: (!) 145/48 (!) 152/84 (!) 148/88 (!) 142/82  Pulse: 77 69 72 77  Resp: 18 16 16 16   Temp: 98.3 F (36.8 C)  98.3 F (36.8 C)   TempSrc: Oral     SpO2: 98% 98% 98% 98%  Weight:      Height:       Physical Exam Vitals and nursing note reviewed.  Constitutional:      General: He is awake. He is not in acute distress.    Appearance: He is well-developed and normal weight.  HENT:     Head: Normocephalic.     Nose: No rhinorrhea.      Mouth/Throat:     Mouth: Mucous membranes are dry.  Eyes:     General: No scleral icterus.    Pupils: Pupils are equal, round, and reactive to light.  Neck:     Vascular: No JVD.  Cardiovascular:     Rate and Rhythm: Normal rate and regular rhythm.     Heart sounds: S1 normal and S2 normal.  Pulmonary:     Effort: Pulmonary effort is normal.     Breath sounds: Normal breath sounds.  Abdominal:     General: Abdomen is protuberant. There is distension.     Palpations: Abdomen is soft.     Tenderness: There is abdominal tenderness in the epigastric area. There is no right CVA tenderness, left CVA tenderness, guarding or rebound.  Genitourinary:    Testes:  Right: Swelling not present.  Musculoskeletal:     Cervical back: Neck supple.     Right lower leg: No edema.     Left lower leg: No edema.  Skin:    General: Skin is warm and dry.  Neurological:     General: No focal deficit present.     Mental Status: He is alert and oriented to person, place, and time.  Psychiatric:        Mood and Affect: Mood normal.        Behavior: Behavior normal. Behavior is cooperative.    Data Reviewed:  Results are pending, will review when available.  Assessment and Plan: Principal Problem:   SBO (small bowel obstruction) (HCC) Inpatient/MedSurg. Keep NPO. Continue NTG at LIS. Continue IV fluids. Analgesics as needed. Antiemetics as needed. Pantoprazole 40 mg IVP every 24 hours. Keep electrolytes optimized. Follow-up CBC and CMP in AM. Follow-up imaging in the morning. General surgery input appreciated.  Active Problems:   Hydronephrosis In the setting of:   Ureterolithiasis Seems to be improving. Following with urology as an outpatient (Dr. Modena Slater).    HLD (hyperlipidemia) Hold rosuvastatin while NPO.    Hyperglycemia Recheck fasting glucose in AM.    Advance Care Planning:   Code Status: Full Code   Consults: [place] [facility].  Family  Communication:   Severity of Illness: The appropriate patient status for this patient is INPATIENT. Inpatient status is judged to be reasonable and necessary in order to provide the required intensity of service to ensure the patient's safety. The patient's presenting symptoms, physical exam findings, and initial radiographic and laboratory data in the context of their chronic comorbidities is felt to place them at high risk for further clinical deterioration. Furthermore, it is not anticipated that the patient will be medically stable for discharge from the hospital within 2 midnights of admission.   * I certify that at the point of admission it is my clinical judgment that the patient will require inpatient hospital care spanning beyond 2 midnights from the point of admission due to high intensity of service, high risk for further deterioration and high frequency of surveillance required.*  Author: [name], MD 05/04/2022 12:12 PM  For on call review [email or web address].   This document was prepared using Dragon voice recognition software and may contain some unintended transcription errors.

## 2022-05-05 ENCOUNTER — Inpatient Hospital Stay (HOSPITAL_COMMUNITY): Payer: Medicare Other

## 2022-05-05 DIAGNOSIS — E7849 Other hyperlipidemia: Secondary | ICD-10-CM

## 2022-05-05 DIAGNOSIS — K56609 Unspecified intestinal obstruction, unspecified as to partial versus complete obstruction: Secondary | ICD-10-CM | POA: Diagnosis not present

## 2022-05-05 DIAGNOSIS — R739 Hyperglycemia, unspecified: Secondary | ICD-10-CM | POA: Diagnosis not present

## 2022-05-05 DIAGNOSIS — N201 Calculus of ureter: Secondary | ICD-10-CM | POA: Diagnosis not present

## 2022-05-05 DIAGNOSIS — N1330 Unspecified hydronephrosis: Secondary | ICD-10-CM

## 2022-05-05 LAB — COMPREHENSIVE METABOLIC PANEL
ALT: 14 U/L (ref 0–44)
AST: 16 U/L (ref 15–41)
Albumin: 3.3 g/dL — ABNORMAL LOW (ref 3.5–5.0)
Alkaline Phosphatase: 72 U/L (ref 38–126)
Anion gap: 7 (ref 5–15)
BUN: 17 mg/dL (ref 8–23)
CO2: 23 mmol/L (ref 22–32)
Calcium: 8.4 mg/dL — ABNORMAL LOW (ref 8.9–10.3)
Chloride: 110 mmol/L (ref 98–111)
Creatinine, Ser: 1.19 mg/dL (ref 0.61–1.24)
GFR, Estimated: >60 mL/min (ref >60)
Glucose, Bld: 96 mg/dL (ref 70–99)
Potassium: 3.9 mmol/L (ref 3.5–5.1)
Sodium: 140 mmol/L (ref 135–145)
Total Bilirubin: 1.5 mg/dL — ABNORMAL HIGH (ref 0.3–1.2)
Total Protein: 6.1 g/dL — ABNORMAL LOW (ref 6.5–8.1)

## 2022-05-05 LAB — CBC
HCT: 36 % — ABNORMAL LOW (ref 39.0–52.0)
Hemoglobin: 11.7 g/dL — ABNORMAL LOW (ref 13.0–17.0)
MCH: 30.7 pg (ref 26.0–34.0)
MCHC: 32.5 g/dL (ref 30.0–36.0)
MCV: 94.5 fL (ref 80.0–100.0)
Platelets: 162 10*3/uL (ref 150–400)
RBC: 3.81 MIL/uL — ABNORMAL LOW (ref 4.22–5.81)
RDW: 14.6 % (ref 11.5–15.5)
WBC: 5.1 10*3/uL (ref 4.0–10.5)
nRBC: 0 % (ref 0.0–0.2)

## 2022-05-05 NOTE — Progress Notes (Addendum)
Progress Note     Subjective: Abdominal pain significantly improved. Having bowel movements. No n/v   Objective: Vital signs in last 24 hours: Temp:  [97.8 F (36.6 C)-98.5 F (36.9 C)] 98 F (36.7 C) (05/03 0433) Pulse Rate:  [68-77] 68 (05/03 0433) Resp:  [16-18] 18 (05/03 0433) BP: (133-159)/(74-88) 137/74 (05/03 0433) SpO2:  [95 %-99 %] 95 % (05/03 0433) Last BM Date : 05/04/22  Intake/Output from previous day: 05/02 0701 - 05/03 0700 In: 1150.4 [I.V.:1150.4] Out: 300 [Urine:300] Intake/Output this shift: No intake/output data recorded.  PE: General: pleasant, WD, male who is laying in bed in NAD Lungs:Respiratory effort nonlabored Abd: soft, ND, +BS, minimal TTP infra umbilically without rebound or guarding. NGT with scant thin bilious output MSK: all 4 extremities are symmetrical with no cyanosis, clubbing, or edema. Skin: warm and dry Psych: A&Ox3 with an appropriate affect.    Lab Results:  Recent Labs    05/04/22 0734 05/05/22 0413  WBC 7.8 5.1  HGB 13.9 11.7*  HCT 42.6 36.0*  PLT 192 162   BMET Recent Labs    05/04/22 0734 05/05/22 0413  NA 138 140  K 3.9 3.9  CL 106 110  CO2 24 23  GLUCOSE 127* 96  BUN 23 17  CREATININE 1.07 1.19  CALCIUM 9.3 8.4*   PT/INR No results for input(s): "LABPROT", "INR" in the last 72 hours. CMP     Component Value Date/Time   NA 140 05/05/2022 0413   K 3.9 05/05/2022 0413   CL 110 05/05/2022 0413   CO2 23 05/05/2022 0413   GLUCOSE 96 05/05/2022 0413   BUN 17 05/05/2022 0413   CREATININE 1.19 05/05/2022 0413   CALCIUM 8.4 (L) 05/05/2022 0413   PROT 6.1 (L) 05/05/2022 0413   ALBUMIN 3.3 (L) 05/05/2022 0413   AST 16 05/05/2022 0413   ALT 14 05/05/2022 0413   ALKPHOS 72 05/05/2022 0413   BILITOT 1.5 (H) 05/05/2022 0413   GFRNONAA >60 05/05/2022 0413   GFRAA >60 05/09/2019 1110   Lipase     Component Value Date/Time   LIPASE 27 02/14/2022 1002       Studies/Results: DG Abd Portable  1V-Small Bowel Obstruction Protocol-initial, 8 hr delay  Result Date: 05/05/2022 CLINICAL DATA:  Small bowel protocol EXAM: PORTABLE ABDOMEN - 1 VIEW COMPARISON:  CT 05/04/2022, radiograph 05/04/2022 FINDINGS: Esophageal tube is looped in the stomach. Enteral contrast is present in the colon and rectum. Mild air distension of small bowel in the central abdomen up to 2.2 cm, decreased compared with scout image from CT 05/04/2022. Prostate seeds IMPRESSION: Enteral contrast is present in the colon and rectum. Mild air distension of small bowel in the central abdomen, decreased compared with prior. Electronically Signed   By: Jasmine Pang M.D.   On: 05/05/2022 02:47   DG Abd Portable 1V-Small Bowel Protocol-Position Verification  Result Date: 05/04/2022 CLINICAL DATA:  Enteric tube placement EXAM: PORTABLE ABDOMEN - 1 VIEW COMPARISON:  Abdominal radiograph dated 03/17/2022 FINDINGS: Gastric/enteric tube tip projects over the stomach. Partially imaged bowel gas pattern is nonspecific. IMPRESSION: Gastric/enteric tube tip projects over the stomach. Electronically Signed   By: Agustin Cree M.D.   On: 05/04/2022 13:28   CT ABDOMEN PELVIS W CONTRAST  Result Date: 05/04/2022 CLINICAL DATA:  Abdominal pain and nausea and vomiting. EXAM: CT ABDOMEN AND PELVIS WITH CONTRAST TECHNIQUE: Multidetector CT imaging of the abdomen and pelvis was performed using the standard protocol following bolus administration of intravenous contrast.  RADIATION DOSE REDUCTION: This exam was performed according to the departmental dose-optimization program which includes automated exposure control, adjustment of the mA and/or kV according to patient size and/or use of iterative reconstruction technique. CONTRAST:  OMNIPAQUE IOHEXOL 300 MG/ML  SOLN COMPARISON:  03/28/2022 FINDINGS: Lower chest: Cylindrical bronchiectasis in both lower lobes. Mild dependent subsegmental atelectasis or scarring in the posterior basal segments of both lower  lobes. Hepatobiliary: Mild focal steatosis in segment 4 of the liver adjacent to falciform ligament. The gallbladder appears unremarkable. No biliary dilatation. Pancreas: Unremarkable Spleen: Unremarkable Adrenals/Urinary Tract: Adrenal glands normal. The kidneys appear unremarkable. No hydronephrosis. There is mild distal left hydroureter. Mildly asymmetrically accentuated density in the distal most portion of the left ureter and left UVJ region as on images 75-77 of series 2 and images 63 through 65 of series 6. On the CT scan of 03/21/2022 the patient had several larger stones in the distal ureter. Possibilities for current appearance may include inflammation from recent stone passage, miniscule residual distal ureteral calculi, or distal ureteral/UVJ tumor. Stomach/Bowel: Mildly dilated loops of small bowel up to 3 cm with scattered air-levels at differing vertical levels in the same loop favoring early or mild small bowel obstruction. Transition between dilated and nondilated bowel is in the right paramedian upper pelvis, with accentuated multifocal mucosal enhancement within the involved small bowel loop on images 76 through 78 of series 6 but without a substantially angulated bowel loops. The ileum distal to the transition has some segments of mildly accentuated mucosal enhancement possibly from local inflammation. I do not see overt inflammation of the terminal ileum. The appearance and location of the transition is very similar to 03/31/2021, with some localized scarring/stenosis of the ileum in the vicinity postulated on that exam. Trace mesenteric edema adjacent to some of the dilated small bowel loops. No hypoenhancing bowel wall. There is some mild reversal of the location of the SMA and SMV branches approach in the pelvis but no truly swirled mesentery is observed. Vascular/Lymphatic: There is some minimal atheromatous calcification dorsally near the origin of the celiac trunk, without occlusion or  high-grade stenosis in the vicinity. Minimal abdominal aortic atherosclerosis. Today's exam was not performed as a CT angiogram but we do not demonstrate an obvious abnormal filling defect in the SMA. Reproductive: Brachytherapy seed implants in the prostate gland. Other: No supplemental non-categorized findings. 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 Mild to moderate degenerative hip arthropathy bilaterally. IMPRESSION: 1. Mildly dilated loops of small bowel with scattered air-levels at differing levels favoring partial or early small bowel obstruction. Transition between dilated and nondilated bowel is in the right paramedian upper pelvis, similar location and appearance to 03/31/2021 examination, with some accentuated mucosal enhancement within the involved small bowel loop but without a substantially angulated bowel loops. Localized scarring/stenosis of the ileum in the vicinity of the transition was postulated on that exam. 2. Mild distal left hydroureter with some asymmetrically accentuated density in the distal most portion of the left ureter and left UVJ region. On the CT scan of 03/21/2022 the patient had several larger stones in the distal ureter. Possibilities for current appearance may include inflammation from recent stone passage, miniscule residual distal ureteral calculi, or distal ureteral/UVJ tumor. Urology follow up may be warranted. 3. Cylindrical bronchiectasis in both lower lobes. 4. 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 5. Brachytherapy seed implants in the prostate gland. Electronically  Signed   By: Gaylyn Rong M.D.   On: 05/04/2022 11:50   DG Abd 2 Views  Result Date: 05/04/2022 CLINICAL DATA:  [id number] Abdominal pain [id number] EXAM: ABDOMEN - 2 VIEW COMPARISON:  March 26, 2022 FINDINGS: There are multiple moderately dilated loops of small bowel in the LEFT hemiabdomen. Represent loop of bowel  spans approximately 3.5 cm. There are scattered air-fluid levels on upright imaging. No definitive free air. Moderate colonic stool burden predominately the RIGHT hemicolon. Brachytherapy seeds in the prostate. Visualized lung bases are unremarkable. IMPRESSION: Multiple moderately dilated loops of small bowel with air-fluid levels. Findings are concerning for a developing small bowel obstruction. Consider further evaluation with dedicated CT scan abdomen pelvis with contrast versus serial abdominal radiographs. Electronically Signed   By: Meda Klinefelter M.D.   On: 05/04/2022 08:08    Anti-infectives: Anti-infectives (From admission, onward)    None        Assessment/Plan  SBO - h/o pelvic radiation may be contributory vs adhesive SBO - he has had bowel movements and contrast from SBO protocol in colon/rectum on abd xray - remove NGT and start CLD. Possible advancement this afternoon  Hopefully he continues to resolve with conservative management and will plan for outpatient follow up with Dr. Cliffton Asters after discharge  FEN: CLD ID: none VTE: lovenox  Nephrolithiasis H/O prostate cancer, radiation to pelvis HLD  I reviewed hospitalist notes, last 24 h vitals and pain scores, last 48 h intake and output, last 24 h labs and trends, and last 24 h imaging results.     LOS: 1 day   Eric Form, [facility] Surgery 05/05/2022, 7:52 AM Please see Amion for pager number during day hours 7:00am-4:30pm

## 2022-05-05 NOTE — Progress Notes (Signed)
PROGRESS NOTE  Shawn Walsh AVW:[id number] DOB: 04-Feb-1945   PCP: Daisy Floro, MD  Patient is from: Home.  Independently ambulates at baseline.  DOA: 05/04/2022 LOS: 1  Chief complaints Chief Complaint  Patient presents with   Abdominal Pain     Brief Narrative / Interim history: 77 year old M with PMH of recurrent SBO, osteoarthritis, prostate cancer, nephrolithiasis and HLD presenting with nausea, vomiting and pain and admitted for small bowel obstruction.  2 view abdominal x-ray showed multiple moderately dilated loops of small bowel with air-fluid level concerning for SBO.  CT abdomen and pelvis confirmed SBO, and showed mild distal left hydroureter with some asymmetry accentuated density in the distalmost portion of left ureter and left UVJ region concerning for recent stone passage, minuscule residual distal ureteral calculi or distal ureteral/UVJ tumor.  On admission, general surgery consulted.  NG tube placed and the small bowel protocol initiated.  The next day, small bowel protocol x-ray with contrast in colon and rectum.  Patient had a bowel movement and passing gas.  NG tube discontinued.  Started on clear liquid diet.   Subjective: Seen and examined earlier this morning.  No major events overnight of this morning.  Reports having regular bowel movements last night about 11:30 PM.  Passing gas since then.  Denies nausea or vomiting.  Denies abdominal pain but some tenderness with palpation.  Objective: Vitals:   05/04/22 1622 05/04/22 2032 05/05/22 0031 05/05/22 0433  BP: (!) 152/81 138/85 133/77 137/74  Pulse: 69 69 70 68  Resp:  18 18 18   Temp: 97.8 F (36.6 C) 98.5 F (36.9 C) 98.3 F (36.8 C) 98 F (36.7 C)  TempSrc: Oral   Oral  SpO2: 99% 98% 97% 95%  Weight:      Height:        Examination:  GENERAL: No apparent distress.  Nontoxic. HEENT: MMM.  Vision and hearing grossly intact.  NECK: Supple.  No apparent JVD.  RESP:  No IWOB.  Fair aeration  bilaterally. CVS:  RRR. Heart sounds normal.  ABD/GI/GU: BS+. Abd soft.  Mild tenderness with palpation. MSK/EXT:  Moves extremities. No apparent deformity. No edema.  SKIN: no apparent skin lesion or wound NEURO: Awake, alert and oriented appropriately.  No apparent focal neuro deficit. PSYCH: Calm. Normal affect.   Procedures:  NG tube for bowel decompression  Microbiology summarized: None  Assessment and plan: Principal Problem:   SBO (small bowel obstruction) (HCC) Active Problems:   Ureterolithiasis   HLD (hyperlipidemia)   Hydronephrosis   Hyperglycemia  Recurrent SBO: Seems to have resolved clinically and radiologically.  SBO protocol x-ray with contrast in colon and rectum.  Had bowel movements.  Passing gas. -General surgery on board-started clear liquid diet.  Plan to discontinue NG tube.   History of nephrolithiasis/left hydroureter: showed mild distal left hydroureter with some asymmetry accentuated density in the distalmost portion of left ureter and left UVJ region concerning for recent stone passage, minuscule residual distal ureteral calculi or distal ureteral/UVJ tumor.  Already followed by urology outpatient. -Continue outpatient urology follow-up  History of prostate cancer s/p brachytherapy -Continue urology follow-up outpatient  Hyperglycemia: No history of diabetes.  Likely stress-induced.  Resolved.  Hyperlipidemia -Continue home statin  Osteoarthritis: Stable.  Body mass index is 22.96 kg/m.           DVT prophylaxis:  enoxaparin (LOVENOX) injection 40 mg Start: 05/04/22 2000  Code Status: Full code Family Communication: None at bedside Level of care: Med-Surg Status is:  Inpatient Remains inpatient appropriate because: SBO   Final disposition: Home in the next day 24 to 48 hours Consultants:  General surgery  35 minutes with more than 50% spent in reviewing records, counseling patient/family and coordinating care.   Sch Meds:   Scheduled Meds:  enoxaparin (LOVENOX) injection  40 mg Subcutaneous Q24H   pantoprazole (PROTONIX) IV  40 mg Intravenous Q24H   Continuous Infusions:  0.9 % NaCl with KCl 20 mEq / L 100 mL/hr at 05/05/22 0449   PRN Meds:.acetaminophen **OR** acetaminophen, HYDROmorphone (DILAUDID) injection, ondansetron **OR** ondansetron (ZOFRAN) IV  Antimicrobials: Anti-infectives (From admission, onward)    None        I have personally reviewed the following labs and images: CBC: Recent Labs  Lab 05/04/22 0734 05/05/22 0413  WBC 7.8 5.1  NEUTROABS 5.8  --   HGB 13.9 11.7*  HCT 42.6 36.0*  MCV 93.4 94.5  PLT 192 162   BMP &GFR Recent Labs  Lab 05/04/22 0734 05/05/22 0413  NA 138 140  K 3.9 3.9  CL 106 110  CO2 24 23  GLUCOSE 127* 96  BUN 23 17  CREATININE 1.07 1.19  CALCIUM 9.3 8.4*   Estimated Creatinine Clearance: 50.3 mL/min (by C-G formula based on SCr of 1.19 mg/dL). Liver & Pancreas: Recent Labs  Lab 05/05/22 0413  AST 16  ALT 14  ALKPHOS 72  BILITOT 1.5*  PROT 6.1*  ALBUMIN 3.3*   No results for input(s): "LIPASE", "AMYLASE" in the last 168 hours. No results for input(s): "AMMONIA" in the last 168 hours. Diabetic: No results for input(s): "HGBA1C" in the last 72 hours. No results for input(s): "GLUCAP" in the last 168 hours. Cardiac Enzymes: No results for input(s): "CKTOTAL", "CKMB", "CKMBINDEX", "TROPONINI" in the last 168 hours. No results for input(s): "PROBNP" in the last 8760 hours. Coagulation Profile: No results for input(s): "INR", "PROTIME" in the last 168 hours. Thyroid Function Tests: No results for input(s): "TSH", "T4TOTAL", "FREET4", "T3FREE", "THYROIDAB" in the last 72 hours. Lipid Profile: No results for input(s): "CHOL", "HDL", "LDLCALC", "TRIG", "CHOLHDL", "LDLDIRECT" in the last 72 hours. Anemia Panel: No results for input(s): "VITAMINB12", "FOLATE", "FERRITIN", "TIBC", "IRON", "RETICCTPCT" in the last 72 hours. Urine analysis:     Component Value Date/Time   COLORURINE YELLOW 03/28/2022 0953   APPEARANCEUR CLEAR 03/28/2022 0953   LABSPEC 1.013 03/28/2022 0953   PHURINE 6.0 03/28/2022 0953   GLUCOSEU NEGATIVE 03/28/2022 0953   HGBUR MODERATE (A) 03/28/2022 0953   BILIRUBINUR NEGATIVE 03/28/2022 0953   KETONESUR 5 (A) 03/28/2022 0953   PROTEINUR NEGATIVE 03/28/2022 0953   UROBILINOGEN 0.2 08/20/2012 1830   NITRITE NEGATIVE 03/28/2022 0953   LEUKOCYTESUR NEGATIVE 03/28/2022 0953   Sepsis Labs: Invalid input(s): "PROCALCITONIN", "LACTICIDVEN"  Microbiology: No results found for this or any previous visit (from the past 240 hour(s)).  Radiology Studies: DG Abd Portable 1V-Small Bowel Obstruction Protocol-initial, 8 hr delay  Result Date: 05/05/2022 CLINICAL DATA:  Small bowel protocol EXAM: PORTABLE ABDOMEN - 1 VIEW COMPARISON:  CT 05/04/2022, radiograph 05/04/2022 FINDINGS: Esophageal tube is looped in the stomach. Enteral contrast is present in the colon and rectum. Mild air distension of small bowel in the central abdomen up to 2.2 cm, decreased compared with scout image from CT 05/04/2022. Prostate seeds IMPRESSION: Enteral contrast is present in the colon and rectum. Mild air distension of small bowel in the central abdomen, decreased compared with prior. Electronically Signed   By: Jasmine Pang M.D.   On: 05/05/2022  02:47   DG Abd Portable 1V-Small Bowel Protocol-Position Verification  Result Date: 05/04/2022 CLINICAL DATA:  Enteric tube placement EXAM: PORTABLE ABDOMEN - 1 VIEW COMPARISON:  Abdominal radiograph dated 03/17/2022 FINDINGS: Gastric/enteric tube tip projects over the stomach. Partially imaged bowel gas pattern is nonspecific. IMPRESSION: Gastric/enteric tube tip projects over the stomach. Electronically Signed   By: Agustin Cree M.D.   On: 05/04/2022 13:28      Eryn Marandola T. Luverne Zerkle [facility]  If 7PM-7AM, please contact night-coverage [email or web address] 05/05/2022, 12:03 PM

## 2022-05-05 NOTE — TOC Progression Note (Signed)
Transition of Care [facility] - Progression Note    Patient Details  Name: Shawn Walsh MRN: [id number] Date of Birth: 1945/01/06  Transition of [facility]) CM/SW Contact  Beckie Busing, RN Phone Number:[phone]  05/05/2022, 1:33 PM  Clinical Narrative:     Transition of Care [facility]) Screening Note   Patient Details  Name: Shawn Walsh Date of Birth: 07-Sep-1945   Transition of Care [facility]) CM/SW Contact:    Beckie Busing, RN Phone Number: 05/05/2022, 1:33 PM    Transition of Care Department [facility]) has reviewed patient and no TOC needs have been identified at this time. We will continue to monitor patient advancement through interdisciplinary progression rounds. If new patient transition needs arise, please place a TOC consult.          Expected Discharge Plan and Services                                               Social Determinants of Health (SDOH) Interventions SDOH Screenings   Food Insecurity: No Food Insecurity (05/04/2022)  Housing: Low Risk  (05/04/2022)  Transportation Needs: No Transportation Needs (05/04/2022)  Utilities: Not At Risk (05/04/2022)  Depression (PHQ2-9): Low Risk  (04/04/2022)  Tobacco Use: Low Risk  (05/04/2022)    Readmission Risk Interventions     No data to display

## 2022-05-06 DIAGNOSIS — K56609 Unspecified intestinal obstruction, unspecified as to partial versus complete obstruction: Secondary | ICD-10-CM | POA: Diagnosis not present

## 2022-05-06 DIAGNOSIS — R739 Hyperglycemia, unspecified: Secondary | ICD-10-CM | POA: Diagnosis not present

## 2022-05-06 DIAGNOSIS — N201 Calculus of ureter: Secondary | ICD-10-CM | POA: Diagnosis not present

## 2022-05-06 DIAGNOSIS — N1330 Unspecified hydronephrosis: Secondary | ICD-10-CM | POA: Diagnosis not present

## 2022-05-06 LAB — COMPREHENSIVE METABOLIC PANEL
ALT: 13 U/L (ref 0–44)
AST: 16 U/L (ref 15–41)
Albumin: 3.2 g/dL — ABNORMAL LOW (ref 3.5–5.0)
Alkaline Phosphatase: 72 U/L (ref 38–126)
Anion gap: 7 (ref 5–15)
BUN: 11 mg/dL (ref 8–23)
CO2: 25 mmol/L (ref 22–32)
Calcium: 8.8 mg/dL — ABNORMAL LOW (ref 8.9–10.3)
Chloride: 105 mmol/L (ref 98–111)
Creatinine, Ser: 1.09 mg/dL (ref 0.61–1.24)
GFR, Estimated: >60 mL/min (ref >60)
Glucose, Bld: 92 mg/dL (ref 70–99)
Potassium: 3.8 mmol/L (ref 3.5–5.1)
Sodium: 137 mmol/L (ref 135–145)
Total Bilirubin: 1.4 mg/dL — ABNORMAL HIGH (ref 0.3–1.2)
Total Protein: 6.3 g/dL — ABNORMAL LOW (ref 6.5–8.1)

## 2022-05-06 LAB — MAGNESIUM: Magnesium: 2.1 mg/dL (ref 1.7–2.4)

## 2022-05-06 LAB — CBC
HCT: 36.6 % — ABNORMAL LOW (ref 39.0–52.0)
Hemoglobin: 11.9 g/dL — ABNORMAL LOW (ref 13.0–17.0)
MCH: 30.4 pg (ref 26.0–34.0)
MCHC: 32.5 g/dL (ref 30.0–36.0)
MCV: 93.6 fL (ref 80.0–100.0)
Platelets: 172 10*3/uL (ref 150–400)
RBC: 3.91 MIL/uL — ABNORMAL LOW (ref 4.22–5.81)
RDW: 14.3 % (ref 11.5–15.5)
WBC: 4.4 10*3/uL (ref 4.0–10.5)
nRBC: 0 % (ref 0.0–0.2)

## 2022-05-06 LAB — PHOSPHORUS: Phosphorus: 3.2 mg/dL (ref 2.5–4.6)

## 2022-05-06 NOTE — Plan of Care (Signed)
  Problem: Nutrition: Goal: Adequate nutrition will be maintained Outcome: Progressing   Problem: Pain Managment: Goal: General experience of comfort will improve Outcome: Progressing   Problem: Safety: Goal: Ability to remain free from injury will improve Outcome: Progressing   

## 2022-05-06 NOTE — Discharge Summary (Signed)
Physician Discharge Summary  Shawn Walsh HQI:[id number] DOB: [birth year] DOA: 05/04/2022  PCP: Daisy Floro, MD  Admit date: 05/04/2022 Discharge date: 05/06/2022 Admitted From: Home Disposition: Home Recommendations for Outpatient Follow-up:  Follow up with PCP in in 1 to 2 weeks Outpatient follow-up with general surgery Check CMP and CBC at follow-up Please follow up on the following pending results: None  Home Health: Not indicated Equipment/Devices: Not indicated  Discharge Condition: Stable CODE STATUS: Full code  Follow-up Information     Daisy Floro, MD. Schedule an appointment as soon as possible for a visit in 1 week(s).   Specialty: Family Medicine Contact information: [address] [place] [address] [phone]                 Hospital course 77 year old M with PMH of recurrent SBO, osteoarthritis, prostate cancer, nephrolithiasis and HLD presenting with nausea, vomiting and pain and admitted for small bowel obstruction.  2 view abdominal x-ray showed multiple moderately dilated loops of small bowel with air-fluid level concerning for SBO.  CT abdomen and pelvis confirmed SBO, and showed mild distal left hydroureter with some asymmetry accentuated density in the distalmost portion of left ureter and left UVJ region concerning for recent stone passage, minuscule residual distal ureteral calculi or distal ureteral/UVJ tumor.   On admission, general surgery consulted.  NG tube placed and the small bowel protocol initiated.  The next day, small bowel protocol x-ray with contrast in colon and rectum.  Patient had a bowel movement and passing gas.  NG tube discontinued.  Started on clear liquid diet.  On the day of discharge, continued to have regular bowel movements.  Tolerated soft diet.  Cleared for discharge by general surgery for outpatient follow-up.  See individual problem list below for more.   Problems addressed during this  hospitalization Principal Problem:   SBO (small bowel obstruction) (HCC) Active Problems:   Ureterolithiasis   HLD (hyperlipidemia)   Hydronephrosis   Hyperglycemia              Time spent 35 minutes  Vital signs Vitals:   05/05/22 0433 05/05/22 1248 05/05/22 1957 05/06/22 0614  BP: 137/74 121/70 109/68 132/87  Pulse: 68 68 74 65  Temp: 98 F (36.7 C) 97.6 F (36.4 C) 97.8 F (36.6 C) 97.9 F (36.6 C)  Resp: 18 20 19 17   Height:      Weight:      SpO2: 95% 96% 97% 97%  TempSrc: Oral     BMI (Calculated):         Discharge exam  GENERAL: No apparent distress.  Nontoxic. HEENT: MMM.  Vision and hearing grossly intact.  NECK: Supple.  No apparent JVD.  RESP:  No IWOB.  Fair aeration bilaterally. CVS:  RRR. Heart sounds normal.  ABD/GI/GU: BS+. Abd soft, NTND.  MSK/EXT:  Moves extremities. No apparent deformity. No edema.  SKIN: no apparent skin lesion or wound NEURO: Awake and alert. Oriented appropriately.  No apparent focal neuro deficit. PSYCH: Calm. Normal affect.   Discharge Instructions Discharge Instructions     Call MD for:  persistant nausea and vomiting   Complete by: As directed    Call MD for:  severe uncontrolled pain   Complete by: As directed    Diet general   Complete by: As directed    Discharge instructions   Complete by: As directed    It has been a pleasure taking care of you!  You were hospitalized  due to small bowel obstruction that seems to have resolved.  You may follow-up with your surgeon per their recommendation.  Continue soft diet for the next 2 to 3 days.   Take care,   Increase activity slowly   Complete by: As directed       Allergies as of 05/06/2022       Reactions   Amoxicillin Nausea And Vomiting   Amoxicillin-pot Clavulanate Nausea And Vomiting   Moxifloxacin Nausea And Vomiting   Nsaids Other (See Comments)   Elevated kidney "tests"        Medication List     STOP taking these medications     ketorolac 10 MG tablet Commonly known as: TORADOL       TAKE these medications    b complex vitamins capsule Take 1 capsule by mouth daily.   CENTRUM SILVER PO Take 1 tablet by mouth daily with breakfast.   mometasone 0.1 % cream Commonly known as: ELOCON Apply 1 Application topically daily as needed (for itching or redness- affected areas).   omeprazole 20 MG capsule Commonly known as: PRILOSEC Take 20 mg by mouth daily before breakfast.   polyethylene glycol 17 g packet Commonly known as: MIRALAX / GLYCOLAX Take 17 g by mouth daily as needed for moderate constipation. Available OTC   rosuvastatin 5 MG tablet Commonly known as: CRESTOR Take 5 mg by mouth daily.   tamsulosin 0.4 MG Caps capsule Commonly known as: FLOMAX Take 1 capsule (0.4 mg total) by mouth daily.   TYLENOL 500 MG tablet Generic drug: acetaminophen Take 500 mg by mouth every 6 (six) hours as needed for mild pain or headache.   Vitamin D3 25 MCG (1000 UT) Caps Take 1,000 Units by mouth daily.        Consultations: General surgery  Procedures/Studies:   DG Abd Portable 1V-Small Bowel Obstruction Protocol-initial, 8 hr delay  Result Date: 05/05/2022 CLINICAL DATA:  Small bowel protocol EXAM: PORTABLE ABDOMEN - 1 VIEW COMPARISON:  CT 05/04/2022, radiograph 05/04/2022 FINDINGS: Esophageal tube is looped in the stomach. Enteral contrast is present in the colon and rectum. Mild air distension of small bowel in the central abdomen up to 2.2 cm, decreased compared with scout image from CT 05/04/2022. Prostate seeds IMPRESSION: Enteral contrast is present in the colon and rectum. Mild air distension of small bowel in the central abdomen, decreased compared with prior. Electronically Signed   By: Jasmine Pang M.D.   On: 05/05/2022 02:47   DG Abd Portable 1V-Small Bowel Protocol-Position Verification  Result Date: 05/04/2022 CLINICAL DATA:  Enteric tube placement EXAM: PORTABLE ABDOMEN - 1 VIEW  COMPARISON:  Abdominal radiograph dated 03/17/2022 FINDINGS: Gastric/enteric tube tip projects over the stomach. Partially imaged bowel gas pattern is nonspecific. IMPRESSION: Gastric/enteric tube tip projects over the stomach. Electronically Signed   By: Agustin Cree M.D.   On: 05/04/2022 13:28   CT ABDOMEN PELVIS W CONTRAST  Result Date: 05/04/2022 CLINICAL DATA:  Abdominal pain and nausea and vomiting. EXAM: CT ABDOMEN AND PELVIS WITH CONTRAST TECHNIQUE: Multidetector CT imaging of the abdomen and pelvis was performed using the standard protocol following bolus administration of intravenous contrast. RADIATION DOSE REDUCTION: This exam was performed according to the departmental dose-optimization program which includes automated exposure control, adjustment of the mA and/or kV according to patient size and/or use of iterative reconstruction technique. CONTRAST:  OMNIPAQUE IOHEXOL 300 MG/ML  SOLN COMPARISON:  03/28/2022 FINDINGS: Lower chest: Cylindrical bronchiectasis in both lower lobes. Mild dependent subsegmental  atelectasis or scarring in the posterior basal segments of both lower lobes. Hepatobiliary: Mild focal steatosis in segment 4 of the liver adjacent to falciform ligament. The gallbladder appears unremarkable. No biliary dilatation. Pancreas: Unremarkable Spleen: Unremarkable Adrenals/Urinary Tract: Adrenal glands normal. The kidneys appear unremarkable. No hydronephrosis. There is mild distal left hydroureter. Mildly asymmetrically accentuated density in the distal most portion of the left ureter and left UVJ region as on images 75-77 of series 2 and images 63 through 65 of series 6. On the CT scan of 03/21/2022 the patient had several larger stones in the distal ureter. Possibilities for current appearance may include inflammation from recent stone passage, miniscule residual distal ureteral calculi, or distal ureteral/UVJ tumor. Stomach/Bowel: Mildly dilated loops of small bowel up to 3 cm  with scattered air-levels at differing vertical levels in the same loop favoring early or mild small bowel obstruction. Transition between dilated and nondilated bowel is in the right paramedian upper pelvis, with accentuated multifocal mucosal enhancement within the involved small bowel loop on images 76 through 78 of series 6 but without a substantially angulated bowel loops. The ileum distal to the transition has some segments of mildly accentuated mucosal enhancement possibly from local inflammation. I do not see overt inflammation of the terminal ileum. The appearance and location of the transition is very similar to 03/31/2021, with some localized scarring/stenosis of the ileum in the vicinity postulated on that exam. Trace mesenteric edema adjacent to some of the dilated small bowel loops. No hypoenhancing bowel wall. There is some mild reversal of the location of the SMA and SMV branches approach in the pelvis but no truly swirled mesentery is observed. Vascular/Lymphatic: There is some minimal atheromatous calcification dorsally near the origin of the celiac trunk, without occlusion or high-grade stenosis in the vicinity. Minimal abdominal aortic atherosclerosis. Today's exam was not performed as a CT angiogram but we do not demonstrate an obvious abnormal filling defect in the SMA. Reproductive: Brachytherapy seed implants in the prostate gland. Other: No supplemental non-categorized findings. 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 Mild to moderate degenerative hip arthropathy bilaterally. IMPRESSION: 1. Mildly dilated loops of small bowel with scattered air-levels at differing levels favoring partial or early small bowel obstruction. Transition between dilated and nondilated bowel is in the right paramedian upper pelvis, similar location and appearance to 03/31/2021 examination, with some accentuated mucosal enhancement within the involved small bowel  loop but without a substantially angulated bowel loops. Localized scarring/stenosis of the ileum in the vicinity of the transition was postulated on that exam. 2. Mild distal left hydroureter with some asymmetrically accentuated density in the distal most portion of the left ureter and left UVJ region. On the CT scan of 03/21/2022 the patient had several larger stones in the distal ureter. Possibilities for current appearance may include inflammation from recent stone passage, miniscule residual distal ureteral calculi, or distal ureteral/UVJ tumor. Urology follow up may be warranted. 3. Cylindrical bronchiectasis in both lower lobes. 4. 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 5. Brachytherapy seed implants in the prostate gland. Electronically Signed   By: Gaylyn Rong M.D.   On: 05/04/2022 11:50   DG Abd 2 Views  Result Date: 05/04/2022 CLINICAL DATA:  [id number] Abdominal pain [id number] EXAM: ABDOMEN - 2 VIEW COMPARISON:  March 26, 2022 FINDINGS: There are multiple moderately dilated loops of small bowel in the LEFT hemiabdomen. Represent loop of bowel  spans approximately 3.5 cm. There are scattered air-fluid levels on upright imaging. No definitive free air. Moderate colonic stool burden predominately the RIGHT hemicolon. Brachytherapy seeds in the prostate. Visualized lung bases are unremarkable. IMPRESSION: Multiple moderately dilated loops of small bowel with air-fluid levels. Findings are concerning for a developing small bowel obstruction. Consider further evaluation with dedicated CT scan abdomen pelvis with contrast versus serial abdominal radiographs. Electronically Signed   By: Meda Klinefelter M.D.   On: 05/04/2022 08:08       The results of significant diagnostics from this hospitalization (including imaging, microbiology, ancillary and laboratory) are listed below for reference.     Microbiology: No results found for this or any previous visit (from  the past 240 hour(s)).   Labs:  CBC: Recent Labs  Lab 05/04/22 0734 05/05/22 0413 05/06/22 0506  WBC 7.8 5.1 4.4  NEUTROABS 5.8  --   --   HGB 13.9 11.7* 11.9*  HCT 42.6 36.0* 36.6*  MCV 93.4 94.5 93.6  PLT 192 162 172   BMP &GFR Recent Labs  Lab 05/04/22 0734 05/05/22 0413 05/06/22 0506  NA 138 140 137  K 3.9 3.9 3.8  CL 106 110 105  CO2 24 23 25   GLUCOSE 127* 96 92  BUN 23 17 11   CREATININE 1.07 1.19 1.09  CALCIUM 9.3 8.4* 8.8*  MG  --   --  2.1  PHOS  --   --  3.2   Estimated Creatinine Clearance: 54.9 mL/min (by C-G formula based on SCr of 1.09 mg/dL). Liver & Pancreas: Recent Labs  Lab 05/05/22 0413 05/06/22 0506  AST 16 16  ALT 14 13  ALKPHOS 72 72  BILITOT 1.5* 1.4*  PROT 6.1* 6.3*  ALBUMIN 3.3* 3.2*   No results for input(s): "LIPASE", "AMYLASE" in the last 168 hours. No results for input(s): "AMMONIA" in the last 168 hours. Diabetic: No results for input(s): "HGBA1C" in the last 72 hours. No results for input(s): "GLUCAP" in the last 168 hours. Cardiac Enzymes: No results for input(s): "CKTOTAL", "CKMB", "CKMBINDEX", "TROPONINI" in the last 168 hours. No results for input(s): "PROBNP" in the last 8760 hours. Coagulation Profile: No results for input(s): "INR", "PROTIME" in the last 168 hours. Thyroid Function Tests: No results for input(s): "TSH", "T4TOTAL", "FREET4", "T3FREE", "THYROIDAB" in the last 72 hours. Lipid Profile: No results for input(s): "CHOL", "HDL", "LDLCALC", "TRIG", "CHOLHDL", "LDLDIRECT" in the last 72 hours. Anemia Panel: No results for input(s): "VITAMINB12", "FOLATE", "FERRITIN", "TIBC", "IRON", "RETICCTPCT" in the last 72 hours. Urine analysis:    Component Value Date/Time   COLORURINE YELLOW 03/28/2022 0953   APPEARANCEUR CLEAR 03/28/2022 0953   LABSPEC 1.013 03/28/2022 0953   PHURINE 6.0 03/28/2022 0953   GLUCOSEU NEGATIVE 03/28/2022 0953   HGBUR MODERATE (A) 03/28/2022 0953   BILIRUBINUR NEGATIVE 03/28/2022  0953   KETONESUR 5 (A) 03/28/2022 0953   PROTEINUR NEGATIVE 03/28/2022 0953   UROBILINOGEN 0.2 08/20/2012 1830   NITRITE NEGATIVE 03/28/2022 0953   LEUKOCYTESUR NEGATIVE 03/28/2022 0953   Sepsis Labs: Invalid input(s): "PROCALCITONIN", "LACTICIDVEN"   SIGNED:  Almon Hercules, MD  [facility] 05/06/2022, 3:13 PM

## 2022-05-06 NOTE — Progress Notes (Signed)
Subjective/Chief Complaint: Having flatus/stool, tol clears   Objective: Vital signs in last 24 hours: Temp:  [97.6 F (36.4 C)-97.9 F (36.6 C)] 97.9 F (36.6 C) (05/04 0614) Pulse Rate:  [65-74] 65 (05/04 0614) Resp:  [17-20] 17 (05/04 0614) BP: (109-132)/(68-87) 132/87 (05/04 0614) SpO2:  [96 %-97 %] 97 % (05/04 0614) Last BM Date : 05/05/22  Intake/Output from previous day: 05/03 0701 - 05/04 0700 In: 883.8 [I.V.:883.8] Out: -  Intake/Output this shift: No intake/output data recorded.  Ab soft nontender nondistended  Lab Results:  Recent Labs    05/05/22 0413 05/06/22 0506  WBC 5.1 4.4  HGB 11.7* 11.9*  HCT 36.0* 36.6*  PLT 162 172   BMET Recent Labs    05/05/22 0413 05/06/22 0506  NA 140 137  K 3.9 3.8  CL 110 105  CO2 23 25  GLUCOSE 96 92  BUN 17 11  CREATININE 1.19 1.09  CALCIUM 8.4* 8.8*   PT/INR No results for input(s): "LABPROT", "INR" in the last 72 hours. ABG No results for input(s): "PHART", "HCO3" in the last 72 hours.  Invalid input(s): "PCO2", "PO2"  Studies/Results: DG Abd Portable 1V-Small Bowel Obstruction Protocol-initial, 8 hr delay  Result Date: 05/05/2022 CLINICAL DATA:  Small bowel protocol EXAM: PORTABLE ABDOMEN - 1 VIEW COMPARISON:  CT 05/04/2022, radiograph 05/04/2022 FINDINGS: Esophageal tube is looped in the stomach. Enteral contrast is present in the colon and rectum. Mild air distension of small bowel in the central abdomen up to 2.2 cm, decreased compared with scout image from CT 05/04/2022. Prostate seeds IMPRESSION: Enteral contrast is present in the colon and rectum. Mild air distension of small bowel in the central abdomen, decreased compared with prior. Electronically Signed   By: Jasmine Pang M.D.   On: 05/05/2022 02:47   DG Abd Portable 1V-Small Bowel Protocol-Position Verification  Result Date: 05/04/2022 CLINICAL DATA:  Enteric tube placement EXAM: PORTABLE ABDOMEN - 1 VIEW COMPARISON:  Abdominal radiograph  dated 03/17/2022 FINDINGS: Gastric/enteric tube tip projects over the stomach. Partially imaged bowel gas pattern is nonspecific. IMPRESSION: Gastric/enteric tube tip projects over the stomach. Electronically Signed   By: Agustin Cree M.D.   On: 05/04/2022 13:28   CT ABDOMEN PELVIS W CONTRAST  Result Date: 05/04/2022 CLINICAL DATA:  Abdominal pain and nausea and vomiting. EXAM: CT ABDOMEN AND PELVIS WITH CONTRAST TECHNIQUE: Multidetector CT imaging of the abdomen and pelvis was performed using the standard protocol following bolus administration of intravenous contrast. RADIATION DOSE REDUCTION: This exam was performed according to the departmental dose-optimization program which includes automated exposure control, adjustment of the mA and/or kV according to patient size and/or use of iterative reconstruction technique. CONTRAST:  OMNIPAQUE IOHEXOL 300 MG/ML  SOLN COMPARISON:  03/28/2022 FINDINGS: Lower chest: Cylindrical bronchiectasis in both lower lobes. Mild dependent subsegmental atelectasis or scarring in the posterior basal segments of both lower lobes. Hepatobiliary: Mild focal steatosis in segment 4 of the liver adjacent to falciform ligament. The gallbladder appears unremarkable. No biliary dilatation. Pancreas: Unremarkable Spleen: Unremarkable Adrenals/Urinary Tract: Adrenal glands normal. The kidneys appear unremarkable. No hydronephrosis. There is mild distal left hydroureter. Mildly asymmetrically accentuated density in the distal most portion of the left ureter and left UVJ region as on images 75-77 of series 2 and images 63 through 65 of series 6. On the CT scan of 03/21/2022 the patient had several larger stones in the distal ureter. Possibilities for current appearance may include inflammation from recent stone passage, miniscule residual distal ureteral  calculi, or distal ureteral/UVJ tumor. Stomach/Bowel: Mildly dilated loops of small bowel up to 3 cm with scattered air-levels at  differing vertical levels in the same loop favoring early or mild small bowel obstruction. Transition between dilated and nondilated bowel is in the right paramedian upper pelvis, with accentuated multifocal mucosal enhancement within the involved small bowel loop on images 76 through 78 of series 6 but without a substantially angulated bowel loops. The ileum distal to the transition has some segments of mildly accentuated mucosal enhancement possibly from local inflammation. I do not see overt inflammation of the terminal ileum. The appearance and location of the transition is very similar to 03/31/2021, with some localized scarring/stenosis of the ileum in the vicinity postulated on that exam. Trace mesenteric edema adjacent to some of the dilated small bowel loops. No hypoenhancing bowel wall. There is some mild reversal of the location of the SMA and SMV branches approach in the pelvis but no truly swirled mesentery is observed. Vascular/Lymphatic: There is some minimal atheromatous calcification dorsally near the origin of the celiac trunk, without occlusion or high-grade stenosis in the vicinity. Minimal abdominal aortic atherosclerosis. Today's exam was not performed as a CT angiogram but we do not demonstrate an obvious abnormal filling defect in the SMA. Reproductive: Brachytherapy seed implants in the prostate gland. Other: No supplemental non-categorized findings. 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 Mild to moderate degenerative hip arthropathy bilaterally. IMPRESSION: 1. Mildly dilated loops of small bowel with scattered air-levels at differing levels favoring partial or early small bowel obstruction. Transition between dilated and nondilated bowel is in the right paramedian upper pelvis, similar location and appearance to 03/31/2021 examination, with some accentuated mucosal enhancement within the involved small bowel loop but without a  substantially angulated bowel loops. Localized scarring/stenosis of the ileum in the vicinity of the transition was postulated on that exam. 2. Mild distal left hydroureter with some asymmetrically accentuated density in the distal most portion of the left ureter and left UVJ region. On the CT scan of 03/21/2022 the patient had several larger stones in the distal ureter. Possibilities for current appearance may include inflammation from recent stone passage, miniscule residual distal ureteral calculi, or distal ureteral/UVJ tumor. Urology follow up may be warranted. 3. Cylindrical bronchiectasis in both lower lobes. 4. 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 5. Brachytherapy seed implants in the prostate gland. Electronically Signed   By: Gaylyn Rong M.D.   On: 05/04/2022 11:50   DG Abd 2 Views  Result Date: 05/04/2022 CLINICAL DATA:  [id number] Abdominal pain [id number] EXAM: ABDOMEN - 2 VIEW COMPARISON:  March 26, 2022 FINDINGS: There are multiple moderately dilated loops of small bowel in the LEFT hemiabdomen. Represent loop of bowel spans approximately 3.5 cm. There are scattered air-fluid levels on upright imaging. No definitive free air. Moderate colonic stool burden predominately the RIGHT hemicolon. Brachytherapy seeds in the prostate. Visualized lung bases are unremarkable. IMPRESSION: Multiple moderately dilated loops of small bowel with air-fluid levels. Findings are concerning for a developing small bowel obstruction. Consider further evaluation with dedicated CT scan abdomen pelvis with contrast versus serial abdominal radiographs. Electronically Signed   By: Meda Klinefelter M.D.   On: 05/04/2022 08:08    Anti-infectives: Anti-infectives (From admission, onward)    None       Assessment/Plan: SBO (has had this before) - h/o pelvic radiation may be contributory vs adhesive SBO-  the ct is concerning for an abnormality in small bowel in 4/22, 3/23 and  now - he has had bowel movements and contrast from SBO protocol in colon/rectum on abd xray - plan from the week was to discharge and have him see Dr Cliffton Asters in office.  Hopefully this is successful as he needs an operation for this in my opinion. Will continue with plan. He has a trip to [place] coming soon and I warned him if this recurs he needs to be seen for it and likely needs surgery -if he doesn't tolerate diet will need surgery now -if tolerates diet, he can go home after lunch today FEN: soft ID: none VTE: lovenox   Nephrolithiasis H/O prostate cancer, radiation to pelvis HLD  I reviewed hospitalist notes, last 24 h vitals and pain scores, last 48 h intake and output, last 24 h labs and trends, and last 24 h imaging results.  This care required moderate level of medical decision making.   Emelia Loron 05/06/2022

## 2022-05-30 DIAGNOSIS — K5669 Other partial intestinal obstruction: Secondary | ICD-10-CM | POA: Diagnosis not present

## 2022-06-07 ENCOUNTER — Encounter (HOSPITAL_COMMUNITY): Payer: Self-pay

## 2022-06-20 DIAGNOSIS — H6123 Impacted cerumen, bilateral: Secondary | ICD-10-CM | POA: Diagnosis not present

## 2022-06-20 DIAGNOSIS — Z6821 Body mass index (BMI) 21.0-21.9, adult: Secondary | ICD-10-CM | POA: Diagnosis not present

## 2022-06-28 DIAGNOSIS — E349 Endocrine disorder, unspecified: Secondary | ICD-10-CM | POA: Diagnosis not present

## 2022-06-28 DIAGNOSIS — R948 Abnormal results of function studies of other organs and systems: Secondary | ICD-10-CM | POA: Diagnosis not present

## 2022-07-05 DIAGNOSIS — Z8546 Personal history of malignant neoplasm of prostate: Secondary | ICD-10-CM | POA: Diagnosis not present

## 2022-07-05 DIAGNOSIS — R3912 Poor urinary stream: Secondary | ICD-10-CM | POA: Diagnosis not present

## 2022-07-05 DIAGNOSIS — N201 Calculus of ureter: Secondary | ICD-10-CM | POA: Diagnosis not present

## 2022-07-05 DIAGNOSIS — N401 Enlarged prostate with lower urinary tract symptoms: Secondary | ICD-10-CM | POA: Diagnosis not present

## 2022-07-05 DIAGNOSIS — E349 Endocrine disorder, unspecified: Secondary | ICD-10-CM | POA: Diagnosis not present

## 2022-07-24 ENCOUNTER — Inpatient Hospital Stay (HOSPITAL_COMMUNITY)
Admission: EM | Admit: 2022-07-24 | Discharge: 2022-07-27 | DRG: 389 | Disposition: A | Discharge status: Home / Self Care | Payer: Medicare Other | Attending: Internal Medicine | Admitting: Internal Medicine

## 2022-07-24 ENCOUNTER — Emergency Department (HOSPITAL_COMMUNITY): Payer: Medicare Other

## 2022-07-24 ENCOUNTER — Other Ambulatory Visit: Payer: Self-pay

## 2022-07-24 ENCOUNTER — Encounter (HOSPITAL_COMMUNITY): Payer: Self-pay

## 2022-07-24 DIAGNOSIS — D6489 Other specified anemias: Secondary | ICD-10-CM | POA: Diagnosis present

## 2022-07-24 DIAGNOSIS — K56600 Partial intestinal obstruction, unspecified as to cause: Secondary | ICD-10-CM | POA: Diagnosis not present

## 2022-07-24 DIAGNOSIS — Z888 Allergy status to other drugs, medicaments and biological substances status: Secondary | ICD-10-CM

## 2022-07-24 DIAGNOSIS — Z8616 Personal history of COVID-19: Secondary | ICD-10-CM | POA: Diagnosis not present

## 2022-07-24 DIAGNOSIS — N179 Acute kidney failure, unspecified: Secondary | ICD-10-CM | POA: Diagnosis present

## 2022-07-24 DIAGNOSIS — Z8249 Family history of ischemic heart disease and other diseases of the circulatory system: Secondary | ICD-10-CM | POA: Diagnosis not present

## 2022-07-24 DIAGNOSIS — E860 Dehydration: Secondary | ICD-10-CM | POA: Diagnosis not present

## 2022-07-24 DIAGNOSIS — Z87442 Personal history of urinary calculi: Secondary | ICD-10-CM

## 2022-07-24 DIAGNOSIS — K56609 Unspecified intestinal obstruction, unspecified as to partial versus complete obstruction: Secondary | ICD-10-CM

## 2022-07-24 DIAGNOSIS — Z79899 Other long term (current) drug therapy: Secondary | ICD-10-CM

## 2022-07-24 DIAGNOSIS — Z8546 Personal history of malignant neoplasm of prostate: Secondary | ICD-10-CM

## 2022-07-24 DIAGNOSIS — Y842 Radiological procedure and radiotherapy as the cause of abnormal reaction of the patient, or of later complication, without mention of misadventure at the time of the procedure: Secondary | ICD-10-CM | POA: Diagnosis present

## 2022-07-24 DIAGNOSIS — K6389 Other specified diseases of intestine: Secondary | ICD-10-CM | POA: Diagnosis not present

## 2022-07-24 DIAGNOSIS — E861 Hypovolemia: Secondary | ICD-10-CM | POA: Diagnosis present

## 2022-07-24 DIAGNOSIS — Z83438 Family history of other disorder of lipoprotein metabolism and other lipidemia: Secondary | ICD-10-CM

## 2022-07-24 DIAGNOSIS — I10 Essential (primary) hypertension: Secondary | ICD-10-CM | POA: Diagnosis not present

## 2022-07-24 DIAGNOSIS — K760 Fatty (change of) liver, not elsewhere classified: Secondary | ICD-10-CM | POA: Diagnosis not present

## 2022-07-24 DIAGNOSIS — Z4682 Encounter for fitting and adjustment of non-vascular catheter: Secondary | ICD-10-CM | POA: Diagnosis not present

## 2022-07-24 DIAGNOSIS — Z881 Allergy status to other antibiotic agents status: Secondary | ICD-10-CM | POA: Diagnosis not present

## 2022-07-24 DIAGNOSIS — E785 Hyperlipidemia, unspecified: Secondary | ICD-10-CM | POA: Diagnosis present

## 2022-07-24 DIAGNOSIS — R918 Other nonspecific abnormal finding of lung field: Secondary | ICD-10-CM | POA: Diagnosis not present

## 2022-07-24 DIAGNOSIS — J984 Other disorders of lung: Secondary | ICD-10-CM | POA: Diagnosis not present

## 2022-07-24 DIAGNOSIS — K5669 Other partial intestinal obstruction: Secondary | ICD-10-CM | POA: Diagnosis present

## 2022-07-24 DIAGNOSIS — Z923 Personal history of irradiation: Secondary | ICD-10-CM

## 2022-07-24 DIAGNOSIS — K219 Gastro-esophageal reflux disease without esophagitis: Secondary | ICD-10-CM | POA: Diagnosis present

## 2022-07-24 DIAGNOSIS — J9811 Atelectasis: Secondary | ICD-10-CM | POA: Diagnosis not present

## 2022-07-24 DIAGNOSIS — R1030 Lower abdominal pain, unspecified: Secondary | ICD-10-CM | POA: Diagnosis not present

## 2022-07-24 LAB — MAGNESIUM: Magnesium: 2.2 mg/dL (ref 1.7–2.4)

## 2022-07-24 LAB — CBC WITH DIFFERENTIAL/PLATELET
Abs Immature Granulocytes: 0.04 10*3/uL (ref 0.00–0.07)
Basophils Absolute: 0 10*3/uL (ref 0.0–0.1)
Basophils Relative: 0 %
Eosinophils Absolute: 0 10*3/uL (ref 0.0–0.5)
Eosinophils Relative: 0 %
HCT: 45.9 % (ref 39.0–52.0)
Hemoglobin: 15 g/dL (ref 13.0–17.0)
Immature Granulocytes: 0 %
Lymphocytes Relative: 11 %
Lymphs Abs: 1 10*3/uL (ref 0.7–4.0)
MCH: 30.4 pg (ref 26.0–34.0)
MCHC: 32.7 g/dL (ref 30.0–36.0)
MCV: 93.1 fL (ref 80.0–100.0)
Monocytes Absolute: 0.6 10*3/uL (ref 0.1–1.0)
Monocytes Relative: 7 %
Neutro Abs: 7.5 10*3/uL (ref 1.7–7.7)
Neutrophils Relative %: 82 %
Platelets: 218 10*3/uL (ref 150–400)
RBC: 4.93 MIL/uL (ref 4.22–5.81)
RDW: 14.1 % (ref 11.5–15.5)
WBC: 9.2 10*3/uL (ref 4.0–10.5)
nRBC: 0 % (ref 0.0–0.2)

## 2022-07-24 LAB — URINALYSIS, ROUTINE W REFLEX MICROSCOPIC
Bilirubin Urine: NEGATIVE
Glucose, UA: NEGATIVE mg/dL
Hgb urine dipstick: NEGATIVE
Ketones, ur: NEGATIVE mg/dL
Leukocytes,Ua: NEGATIVE
Nitrite: NEGATIVE
Protein, ur: NEGATIVE mg/dL
Specific Gravity, Urine: >1.046 — ABNORMAL HIGH (ref 1.005–1.030)
pH: 8 (ref 5.0–8.0)

## 2022-07-24 LAB — I-STAT CHEM 8, ED
BUN: 22 mg/dL (ref 8–23)
Calcium, Ion: 1.24 mmol/L (ref 1.15–1.40)
Chloride: 102 mmol/L (ref 98–111)
Creatinine, Ser: 1.3 mg/dL — ABNORMAL HIGH (ref 0.61–1.24)
Glucose, Bld: 126 mg/dL — ABNORMAL HIGH (ref 70–99)
HCT: 45 % (ref 39.0–52.0)
Hemoglobin: 15.3 g/dL (ref 13.0–17.0)
Potassium: 4.3 mmol/L (ref 3.5–5.1)
Sodium: 140 mmol/L (ref 135–145)
TCO2: 26 mmol/L (ref 22–32)

## 2022-07-24 LAB — COMPREHENSIVE METABOLIC PANEL
ALT: 19 U/L (ref 0–44)
AST: 25 U/L (ref 15–41)
Albumin: 4.2 g/dL (ref 3.5–5.0)
Alkaline Phosphatase: 91 U/L (ref 38–126)
Anion gap: 9 (ref 5–15)
BUN: 23 mg/dL (ref 8–23)
CO2: 26 mmol/L (ref 22–32)
Calcium: 9.6 mg/dL (ref 8.9–10.3)
Chloride: 103 mmol/L (ref 98–111)
Creatinine, Ser: 1.26 mg/dL — ABNORMAL HIGH (ref 0.61–1.24)
GFR, Estimated: 59 mL/min — ABNORMAL LOW (ref >60)
Glucose, Bld: 132 mg/dL — ABNORMAL HIGH (ref 70–99)
Potassium: 4.1 mmol/L (ref 3.5–5.1)
Sodium: 138 mmol/L (ref 135–145)
Total Bilirubin: 1.4 mg/dL — ABNORMAL HIGH (ref 0.3–1.2)
Total Protein: 8.1 g/dL (ref 6.5–8.1)

## 2022-07-24 LAB — LIPASE, BLOOD: Lipase: 32 U/L (ref 11–51)

## 2022-07-24 MED ORDER — METOPROLOL TARTRATE 5 MG/5ML IV SOLN: 5.0000 mg | Freq: Four times a day (QID) | INTRAVENOUS | Status: DC | PRN

## 2022-07-24 MED ORDER — PANTOPRAZOLE SODIUM 40 MG IV SOLR
40.0000 mg | Freq: Every day | INTRAVENOUS | Status: DC
  Administered 2022-07-24 – 2022-07-26 (×3): 40 mg via INTRAVENOUS
  Filled 2022-07-24 (×3): qty 10

## 2022-07-24 MED ORDER — ONDANSETRON HCL 4 MG PO TABS: 4.0000 mg | ORAL_TABLET | Freq: Four times a day (QID) | ORAL | Status: DC | PRN

## 2022-07-24 MED ORDER — ACETAMINOPHEN 325 MG PO TABS
650.0000 mg | ORAL_TABLET | Freq: Four times a day (QID) | ORAL | Status: DC | PRN
  Administered 2022-07-26: 650 mg via ORAL
  Filled 2022-07-24: qty 2

## 2022-07-24 MED ORDER — HYDROMORPHONE HCL 1 MG/ML IJ SOLN
0.5000 mg | INTRAMUSCULAR | Status: DC | PRN
  Administered 2022-07-24 – 2022-07-25 (×7): 1 mg via INTRAVENOUS
  Filled 2022-07-24 (×7): qty 1

## 2022-07-24 MED ORDER — TRAZODONE HCL 50 MG PO TABS: 25.0000 mg | ORAL_TABLET | Freq: Every evening | ORAL | Status: DC | PRN

## 2022-07-24 MED ORDER — SODIUM CHLORIDE 0.9 % IV SOLN: INTRAVENOUS | Status: DC

## 2022-07-24 MED ORDER — DIATRIZOATE MEGLUMINE & SODIUM 66-10 % PO SOLN
90.0000 mL | Freq: Once | ORAL | Status: AC
  Administered 2022-07-24: 90 mL via NASOGASTRIC
  Filled 2022-07-24: qty 90

## 2022-07-24 MED ORDER — SODIUM CHLORIDE (PF) 0.9 % IJ SOLN
INTRAMUSCULAR | Status: AC
  Filled 2022-07-24: qty 50

## 2022-07-24 MED ORDER — ONDANSETRON HCL 4 MG/2ML IJ SOLN
4.0000 mg | Freq: Four times a day (QID) | INTRAMUSCULAR | Status: DC | PRN
  Administered 2022-07-24: 4 mg via INTRAVENOUS
  Filled 2022-07-24: qty 2

## 2022-07-24 MED ORDER — LACTATED RINGERS IV BOLUS
1000.0000 mL | Freq: Once | INTRAVENOUS | Status: AC
  Administered 2022-07-24: 1000 mL via INTRAVENOUS

## 2022-07-24 MED ORDER — IOHEXOL 300 MG/ML  SOLN
80.0000 mL | Freq: Once | INTRAMUSCULAR | Status: AC | PRN
  Administered 2022-07-24: 80 mL via INTRAVENOUS

## 2022-07-24 MED ORDER — ENOXAPARIN SODIUM 40 MG/0.4ML IJ SOSY
40.0000 mg | PREFILLED_SYRINGE | Freq: Every day | INTRAMUSCULAR | Status: DC
  Administered 2022-07-24 – 2022-07-27 (×4): 40 mg via SUBCUTANEOUS
  Filled 2022-07-24 (×4): qty 0.4

## 2022-07-24 MED ORDER — HYDROMORPHONE HCL 1 MG/ML IJ SOLN: 0.5000 mg | Freq: Once | INTRAMUSCULAR | Status: DC

## 2022-07-24 MED ORDER — ALBUTEROL SULFATE (2.5 MG/3ML) 0.083% IN NEBU: 2.5000 mg | INHALATION_SOLUTION | RESPIRATORY_TRACT | Status: DC | PRN

## 2022-07-24 MED ORDER — HYDROMORPHONE HCL 1 MG/ML IJ SOLN
0.5000 mg | Freq: Once | INTRAMUSCULAR | Status: AC
  Administered 2022-07-24: 0.5 mg via INTRAVENOUS
  Filled 2022-07-24: qty 1

## 2022-07-24 MED ORDER — ONDANSETRON HCL 4 MG/2ML IJ SOLN
4.0000 mg | Freq: Four times a day (QID) | INTRAMUSCULAR | Status: DC | PRN
  Administered 2022-07-24 – 2022-07-25 (×4): 4 mg via INTRAVENOUS
  Filled 2022-07-24 (×4): qty 2

## 2022-07-24 MED ORDER — ACETAMINOPHEN 650 MG RE SUPP: 650.0000 mg | Freq: Four times a day (QID) | RECTAL | Status: DC | PRN

## 2022-07-24 NOTE — ED Notes (Signed)
ED TO INPATIENT HANDOFF REPORT  Name/Age/Gender Shawn Walsh 77 y.o. male  Code Status    Code Status Orders  (From admission, onward)           Start     Ordered   07/24/22 1228  Full code  Continuous       Question:  By:  Answer:  Consent: discussion documented in EHR   07/24/22 1228           Code Status History     Date Active Date Inactive Code Status Order ID Comments User Context   05/04/2022 1215 05/06/2022 2158 Full Code [id number]  [name], MD ED   03/21/2022 2201 03/24/2022 2051 Full Code [id number]  [name], DO ED   03/21/2022 2141 03/21/2022 2201 Full Code [id number]  [name], DO ED       Home/SNF/Other Home  Chief Complaint SBO (small bowel obstruction) (HCC) [K56.609]  Level of Care/Admitting Diagnosis ED Disposition     ED Disposition  Admit   Condition  --   Comment  Hospital Area: [facility] [[id number]]  Level of Care: Med-Surg [16]  May admit patient to [facility] or [facility] if equivalent level of care is available:: Yes  Covid Evaluation: Asymptomatic - no recent exposure (last 10 days) testing not required  Diagnosis: SBO (small bowel obstruction) [facility]) [737106]  Admitting Physician: Shawn Walsh [[id number]]  Attending Physician: Shawn Walsh, Shawn Walsh [[id number]]  Certification:: I certify this patient will need inpatient services for at least 2 midnights  Estimated Length of Stay: 3          Medical History Past Medical History:  Diagnosis Date   Arthritis    MILD    COVID-19 virus infection    02-2020   History of kidney stones    HLD (hyperlipidemia)    Nephrolithiasis    per renal ultrasound 01-02-2019 in epic, left 11mm   Prostate cancer [facility]) urologist-- dr eskridge/  dr Kathrynn Running   dx 01-14-2019, Stage T1, Gleason 4+5   Wears glasses     Allergies Allergies  Allergen Reactions   Amoxicillin Nausea And Vomiting   Amoxicillin-Pot Clavulanate Nausea And Vomiting   Moxifloxacin  Nausea And Vomiting   Nsaids Other (See Comments)    Elevated kidney "tests"    IV Location/Drains/Wounds Patient Lines/Drains/Airways Status     Active Line/Drains/Airways     Name Placement date Placement time Site Days   Peripheral IV 07/24/22 20 G 1.88" Anterior;Distal;Left;Upper Arm 07/24/22  0920  Arm  less than 1   NG/OG Vented/Dual Lumen 14 Fr. Right nare External length of tube 07/24/22  1205  Right nare  less than 1            Labs/Imaging Results for orders placed or performed during the hospital encounter of 07/24/22 (from the past 48 hour(s))  Comprehensive metabolic panel     Status: Abnormal   Collection Time: 07/24/22  9:26 AM  Result Value Ref Range   Sodium 138 135 - 145 mmol/L   Potassium 4.1 3.5 - 5.1 mmol/L   Chloride 103 98 - 111 mmol/L   CO2 26 22 - 32 mmol/L   Glucose, Bld 132 (H) 70 - 99 mg/dL    Comment: Glucose reference range applies only to samples taken after fasting for at least 8 hours.   BUN 23 8 - 23 mg/dL   Creatinine, Ser 0.93 (H) 0.61 - 1.24 mg/dL   Calcium 9.6 8.9 - 10.3  mg/dL   Total Protein 8.1 6.5 - 8.1 g/dL   Albumin 4.2 3.5 - 5.0 g/dL   AST 25 15 - 41 U/L   ALT 19 0 - 44 U/L   Alkaline Phosphatase 91 38 - 126 U/L   Total Bilirubin 1.4 (H) 0.3 - 1.2 mg/dL   GFR, Estimated 59 (L) >60 mL/min    Comment: (NOTE) Calculated using the CKD-EPI Creatinine Equation (2021)    Anion gap 9 5 - 15    Comment: Performed at [facility], [address] [place], [place] [address]  Lipase, blood     Status: None   Collection Time: 07/24/22  9:26 AM  Result Value Ref Range   Lipase 32 11 - 51 U/L    Comment: Performed at [facility], [address], [place], [place] [address]  CBC with Diff     Status: None   Collection Time: 07/24/22  9:26 AM  Result Value Ref Range   WBC 9.2 4.0 - 10.5 K/uL   RBC 4.93 4.22 - 5.81 MIL/uL   Hemoglobin 15.0 13.0 - 17.0 g/dL   HCT 25.3 66.4 - 40.3 %   MCV 93.1 80.0  - 100.0 fL   MCH 30.4 26.0 - 34.0 pg   MCHC 32.7 30.0 - 36.0 g/dL   RDW 47.4 25.9 - 56.3 %   Platelets 218 150 - 400 K/uL   nRBC 0.0 0.0 - 0.2 %   Neutrophils Relative % 82 %   Neutro Abs 7.5 1.7 - 7.7 K/uL   Lymphocytes Relative 11 %   Lymphs Abs 1.0 0.7 - 4.0 K/uL   Monocytes Relative 7 %   Monocytes Absolute 0.6 0.1 - 1.0 K/uL   Eosinophils Relative 0 %   Eosinophils Absolute 0.0 0.0 - 0.5 K/uL   Basophils Relative 0 %   Basophils Absolute 0.0 0.0 - 0.1 K/uL   Immature Granulocytes 0 %   Abs Immature Granulocytes 0.04 0.00 - 0.07 K/uL    Comment: Performed at [facility], [address] [place], [place] [address]  Magnesium     Status: None   Collection Time: 07/24/22  9:26 AM  Result Value Ref Range   Magnesium 2.2 1.7 - 2.4 mg/dL    Comment: Performed at [facility], [address] [place], [place] [address]  I-Stat Chem 8, ED     Status: Abnormal   Collection Time: 07/24/22  9:35 AM  Result Value Ref Range   Sodium 140 135 - 145 mmol/L   Potassium 4.3 3.5 - 5.1 mmol/L   Chloride 102 98 - 111 mmol/L   BUN 22 8 - 23 mg/dL   Creatinine, Ser 1.88 (H) 0.61 - 1.24 mg/dL   Glucose, Bld 416 (H) 70 - 99 mg/dL    Comment: Glucose reference range applies only to samples taken after fasting for at least 8 hours.   Calcium, Ion 1.24 1.15 - 1.40 mmol/L   TCO2 26 22 - 32 mmol/L   Hemoglobin 15.3 13.0 - 17.0 g/dL   HCT 60.6 30.1 - 60.1 %  Urinalysis, Routine w reflex microscopic -Urine, Clean Catch     Status: Abnormal   Collection Time: 07/24/22 12:16 PM  Result Value Ref Range   Color, Urine YELLOW YELLOW   APPearance CLEAR CLEAR   Specific Gravity, Urine >1.046 (H) 1.005 - 1.030   pH 8.0 5.0 - 8.0   Glucose, UA NEGATIVE NEGATIVE mg/dL   Hgb urine dipstick  NEGATIVE NEGATIVE   Bilirubin Urine NEGATIVE NEGATIVE   Ketones, ur NEGATIVE NEGATIVE mg/dL   Protein, ur NEGATIVE NEGATIVE mg/dL   Nitrite NEGATIVE NEGATIVE   Leukocytes,Ua  NEGATIVE NEGATIVE    Comment: Performed at [facility], [address] [place], [place] [address]   DG Abd Portable 1 View  Result Date: 07/24/2022 CLINICAL DATA:  NG tube placement EXAM: PORTABLE ABDOMEN - 1 VIEW limited for tube placement COMPARISON:  X-ray 05/05/2022.  CT earlier 07/24/2022 FINDINGS: Limited x-ray of the upper abdomen and lower chest demonstrates enteric tube with tip overlying the upper stomach. Side hole near the GE junction. This could be advanced further into the stomach. Once again there are some dilated air-filled loops of small bowel in the midabdomen. Left basilar lung atelectasis. IMPRESSION: Limited x-ray for tube placement has tip overlying the upper stomach with side hole at the GE junction. This could be advanced further into the stomach Electronically Signed   By: Karen Kays M.D.   On: 07/24/2022 13:01   CT ABDOMEN PELVIS W CONTRAST  Result Date: 07/24/2022 CLINICAL DATA:  Lower abdominal pain and vomiting. EXAM: CT ABDOMEN AND PELVIS WITH CONTRAST TECHNIQUE: Multidetector CT imaging of the abdomen and pelvis was performed using the standard protocol following bolus administration of intravenous contrast. RADIATION DOSE REDUCTION: This exam was performed according to the departmental dose-optimization program which includes automated exposure control, adjustment of the mA and/or kV according to patient size and/or use of iterative reconstruction technique. CONTRAST:  80mL OMNIPAQUE IOHEXOL 300 MG/ML  SOLN COMPARISON:  X-ray and CT 05/04/2022 and older FINDINGS: Lower chest: There is some linear opacity seen along bases likely scar or atelectasis. No pleural effusion. Hepatobiliary: Diffuse fatty liver infiltration. Gallbladder is nondilated. Patent portal vein. Pancreas: Unremarkable. No pancreatic ductal dilatation or surrounding inflammatory changes. Spleen: Normal in size without focal abnormality. Adrenals/Urinary Tract: Adrenal glands are  preserved. Mild bilateral renal atrophy. No collecting system dilatation or enhancing renal mass. The ureters have normal course and caliber extending down to the bladder. Bladder has a normal course and caliber. Stomach/Bowel: No oral contrast. Large bowel has a normal course and caliber with some scattered stool. Stomach is distended with fluid. The duodenal and proximal and mid jejunum are nondilated. This point the small bowel becomes more fluid-filled and distended. There is areas of small bowel stool appearance in the right lower quadrant, proximal ileum with diameter approaching up to 3.7 cm. There is a transition point to nondilated loop at this location best seen on coronal image 79 of series 7. This a similar location to the finding seen previously. Vascular/Lymphatic: Normal caliber aorta and IVC with some vascular calcifications. No specific abnormal lymph node enlargement seen in the abdomen and pelvis. Reproductive: Brachytherapy changes along the prostate. Other: Trace free fluid in the pelvis.  No free intra-air. Musculoskeletal: Scattered degenerative changes of the spine and pelvis. Trace anterolisthesis of L5 on S1 with pars defects. IMPRESSION: As on the prior CT examination there is mild dilatation of small bowel with a transition point in the right lower quadrant. Again a developing or partial obstruction is possible. No pneumatosis, free air. Only trace free fluid. Fatty liver infiltration. Electronically Signed   By: Karen Kays M.D.   On: 07/24/2022 11:07    Pending Labs Unresulted Labs (From admission, onward)     Start     Ordered   07/25/22 0500  Basic metabolic panel  Tomorrow morning,   R  07/24/22 1228   07/25/22 0500  CBC  Tomorrow morning,   R        07/24/22 1228            Vitals/Pain Today's Vitals   07/24/22 1105 07/24/22 1200 07/24/22 1216 07/24/22 1225  BP: (!) 127/92 (!) 146/96    Pulse: 73 73  73  Resp: 19 (!) 22  17  Temp:  97.6 F (36.4 C)     TempSrc:  Oral    SpO2: 97% 98%  95%  PainSc:   2      Isolation Precautions No active isolations  Medications Medications  ondansetron (ZOFRAN) injection 4 mg (4 mg Intravenous Given 07/24/22 0923)  pantoprazole (PROTONIX) injection 40 mg (has no administration in time range)  enoxaparin (LOVENOX) injection 40 mg (has no administration in time range)  acetaminophen (TYLENOL) tablet 650 mg (has no administration in time range)    Or  acetaminophen (TYLENOL) suppository 650 mg (has no administration in time range)  0.9 %  sodium chloride infusion (has no administration in time range)  traZODone (DESYREL) tablet 25 mg (has no administration in time range)  ondansetron (ZOFRAN) tablet 4 mg (has no administration in time range)    Or  ondansetron (ZOFRAN) injection 4 mg (has no administration in time range)  HYDROmorphone (DILAUDID) injection 0.5-1 mg (has no administration in time range)  albuterol (PROVENTIL) (2.5 MG/3ML) 0.083% nebulizer solution 2.5 mg (has no administration in time range)  metoprolol tartrate (LOPRESSOR) injection 5 mg (has no administration in time range)  lactated ringers bolus 1,000 mL (0 mLs Intravenous Stopped 07/24/22 1130)  HYDROmorphone (DILAUDID) injection 0.5 mg (0.5 mg Intravenous Given 07/24/22 0923)  iohexol (OMNIPAQUE) 300 MG/ML solution 80 mL (80 mLs Intravenous Contrast Given 07/24/22 1028)    Mobility walks

## 2022-07-24 NOTE — Consult Note (Signed)
Shawn Walsh Single 1945/05/10  [id number].    Requesting MD: Durwin Nora, MD Chief Complaint/Reason for Consult: pSBO  HPI:  Shawn Walsh is a 77 y/o M with a history of prostate CA (s/p radiation), kidney stones, HLD, and recurrent pSBO who presents to the ED due to acute onset abdominal pain, worse in the lower abdomen, that started last night. Associated nausea and vomiting. He has had a BM since symptoms started but is not passing flatus. He does report similar pain in the past. He has a known history of recurrent pSBO, followed by Dr. Cliffton Asters in our office, and has been scheduled for diagnostic laparoscopy 08/30/22. 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 Past abdominal surgeries include open appendectomy 1972 as well as surgery a few years after for SBO.   ROS: Review of Systems  All other systems reviewed and are negative.   Family History  Problem Relation Age of Onset   Hyperlipidemia Brother    Heart attack Brother    Colon cancer Neg Hx    Prostate cancer Neg Hx    Pancreatic cancer Neg Hx    Breast cancer Neg Hx    Colon polyps Neg Hx    Esophageal cancer Neg Hx     Past Medical History:  Diagnosis Date   Arthritis    MILD    COVID-19 virus infection    02-2020   History of kidney stones    HLD (hyperlipidemia)    Nephrolithiasis    per renal ultrasound 01-02-2019 in epic, left 11mm   Prostate cancer [facility]) urologist-- dr eskridge/  dr Kathrynn Running   dx 01-14-2019, Stage T1, Gleason 4+5   Wears glasses     Past Surgical History:  Procedure Laterality Date   ABDOMINAL ADHESION SURGERY  1976   [place]   APPENDECTOMY  1972   COLONOSCOPY  01/06/2002   normal   COLONOSCOPY  01/06/2002   CYSTOSCOPY N/A 05/13/2019   Procedure: CYSTOSCOPY FLEXIBLE;  Surgeon: Jerilee Field, MD;  [facility];  Service: Urology;  Laterality: N/A;  NO SEEDS FOUND IN BLADDER   CYSTOSCOPY/URETEROSCOPY/HOLMIUM LASER/STENT PLACEMENT Left 03/22/2022    Procedure: CYSTOSCOPY/ LEFT RETROGRADE/URETEROSCOPY/HOLMIUM LASER/STENT PLACEMENT;  Surgeon: Sebastian Ache, MD;  Location: [facility];  Service: Urology;  Laterality: Left;   EXTRACORPOREAL SHOCK WAVE LITHOTRIPSY Left 03/13/2022   Procedure: LEFT EXTRACORPOREAL SHOCK WAVE LITHOTRIPSY (ESWL);  Surgeon: Marcine Matar, MD;  Location: [facility];  Service: Urology;  Laterality: Left;  75 MINUTES   RADIOACTIVE SEED IMPLANT N/A 05/13/2019   Procedure: RADIOACTIVE SEED IMPLANT/BRACHYTHERAPY IMPLANT;  Surgeon: Jerilee Field, MD;  Location: [facility];  Service: Urology;  Laterality: N/A;   57  SEEDS IMPLANTED   SPACE OAR INSTILLATION N/A 05/13/2019   Procedure: SPACE OAR INSTILLATION;  Surgeon: Jerilee Field, MD;  Location: [facility];  Service: Urology;  Laterality: N/A;    Social History:  reports that he has never smoked. He has never been exposed to tobacco smoke. He has never used smokeless tobacco. He reports that he does not drink alcohol and does not use drugs.  Allergies:  Allergies  Allergen Reactions   Amoxicillin Nausea And Vomiting   Amoxicillin-Pot Clavulanate Nausea And Vomiting   Moxifloxacin Nausea And Vomiting   Nsaids Other (See Comments)    Elevated kidney "tests"    (Not in a hospital admission)    Physical Exam: Blood pressure (!) 146/96, pulse 73, temperature 97.6 F (36.4 C), temperature  source Oral, resp. rate 17, SpO2 95%. General: Pleasant male laying on hospital bed, appears stated age, NAD. HEENT: head -normocephalic, atraumatic; Eyes: PERRLA, no conjunctival injection; anicteric sclerae Neck- Trachea is midline CV- RRR, no lower extremity edema  Pulm- breathing is non-labored.  Abd- soft, moderate distention, nontender, no hernias or masses; NGT with thin drainage  GU- deferred  MSK- UE/LE symmetrical, no cyanosis, clubbing, or edema. Neuro- CN II-XII grossly in tact, no paresthesias. Psych- Alert and  Oriented x3 with appropriate affect Skin: warm and dry, no rashes or lesions   Results for orders placed or performed during the hospital encounter of 07/24/22 (from the past 48 hour(s))  Comprehensive metabolic panel     Status: Abnormal   Collection Time: 07/24/22  9:26 AM  Result Value Ref Range   Sodium 138 135 - 145 mmol/L   Potassium 4.1 3.5 - 5.1 mmol/L   Chloride 103 98 - 111 mmol/L   CO2 26 22 - 32 mmol/L   Glucose, Bld 132 (H) 70 - 99 mg/dL    Comment: Glucose reference range applies only to samples taken after fasting for at least 8 hours.   BUN 23 8 - 23 mg/dL   Creatinine, Ser 1.61 (H) 0.61 - 1.24 mg/dL   Calcium 9.6 8.9 - 09.6 mg/dL   Total Protein 8.1 6.5 - 8.1 g/dL   Albumin 4.2 3.5 - 5.0 g/dL   AST 25 15 - 41 U/L   ALT 19 0 - 44 U/L   Alkaline Phosphatase 91 38 - 126 U/L   Total Bilirubin 1.4 (H) 0.3 - 1.2 mg/dL   GFR, Estimated 59 (L) >60 mL/min    Comment: (NOTE) Calculated using the CKD-EPI Creatinine Equation (2021)    Anion gap 9 5 - 15    Comment: Performed at [facility], [address] [place], [place] [address]  Lipase, blood     Status: None   Collection Time: 07/24/22  9:26 AM  Result Value Ref Range   Lipase 32 11 - 51 U/L    Comment: Performed at [facility], [address], [place], [place] [address]  CBC with Diff     Status: None   Collection Time: 07/24/22  9:26 AM  Result Value Ref Range   WBC 9.2 4.0 - 10.5 K/uL   RBC 4.93 4.22 - 5.81 MIL/uL   Hemoglobin 15.0 13.0 - 17.0 g/dL   HCT 14.7 82.9 - 56.2 %   MCV 93.1 80.0 - 100.0 fL   MCH 30.4 26.0 - 34.0 pg   MCHC 32.7 30.0 - 36.0 g/dL   RDW 13.0 86.5 - 78.4 %   Platelets 218 150 - 400 K/uL   nRBC 0.0 0.0 - 0.2 %   Neutrophils Relative % 82 %   Neutro Abs 7.5 1.7 - 7.7 K/uL   Lymphocytes Relative 11 %   Lymphs Abs 1.0 0.7 - 4.0 K/uL   Monocytes Relative 7 %   Monocytes Absolute 0.6 0.1 - 1.0 K/uL   Eosinophils Relative 0 %   Eosinophils  Absolute 0.0 0.0 - 0.5 K/uL   Basophils Relative 0 %   Basophils Absolute 0.0 0.0 - 0.1 K/uL   Immature Granulocytes 0 %   Abs Immature Granulocytes 0.04 0.00 - 0.07 K/uL    Comment: Performed at [facility], [address], [place], [place] [address]  Magnesium     Status: None   Collection Time: 07/24/22  9:26 AM  Result  Value Ref Range   Magnesium 2.2 1.7 - 2.4 mg/dL    Comment: Performed at [facility] [address] [place], [place] [address]  I-Stat Chem 8, ED     Status: Abnormal   Collection Time: 07/24/22  9:35 AM  Result Value Ref Range   Sodium 140 135 - 145 mmol/L   Potassium 4.3 3.5 - 5.1 mmol/L   Chloride 102 98 - 111 mmol/L   BUN 22 8 - 23 mg/dL   Creatinine, Ser 6.04 (H) 0.61 - 1.24 mg/dL   Glucose, Bld 540 (H) 70 - 99 mg/dL    Comment: Glucose reference range applies only to samples taken after fasting for at least 8 hours.   Calcium, Ion 1.24 1.15 - 1.40 mmol/L   TCO2 26 22 - 32 mmol/L   Hemoglobin 15.3 13.0 - 17.0 g/dL   HCT 98.1 19.1 - 47.8 %  Urinalysis, Routine w reflex microscopic -Urine, Clean Catch     Status: Abnormal   Collection Time: 07/24/22 12:16 PM  Result Value Ref Range   Color, Urine YELLOW YELLOW   APPearance CLEAR CLEAR   Specific Gravity, Urine >1.046 (H) 1.005 - 1.030   pH 8.0 5.0 - 8.0   Glucose, UA NEGATIVE NEGATIVE mg/dL   Hgb urine dipstick NEGATIVE NEGATIVE   Bilirubin Urine NEGATIVE NEGATIVE   Ketones, ur NEGATIVE NEGATIVE mg/dL   Protein, ur NEGATIVE NEGATIVE mg/dL   Nitrite NEGATIVE NEGATIVE   Leukocytes,Ua NEGATIVE NEGATIVE    Comment: Performed at [facility], [address], [place], [place] [address]   DG Abd Portable 1 View  Result Date: 07/24/2022 CLINICAL DATA:  NG tube placement EXAM: PORTABLE ABDOMEN - 1 VIEW limited for tube placement COMPARISON:  X-ray 05/05/2022.  CT earlier 07/24/2022 FINDINGS: Limited x-ray of the upper abdomen and lower chest  demonstrates enteric tube with tip overlying the upper stomach. Side hole near the GE junction. This could be advanced further into the stomach. Once again there are some dilated air-filled loops of small bowel in the midabdomen. Left basilar lung atelectasis. IMPRESSION: Limited x-ray for tube placement has tip overlying the upper stomach with side hole at the GE junction. This could be advanced further into the stomach Electronically Signed   By: Karen Kays M.D.   On: 07/24/2022 13:01   CT ABDOMEN PELVIS W CONTRAST  Result Date: 07/24/2022 CLINICAL DATA:  Lower abdominal pain and vomiting. EXAM: CT ABDOMEN AND PELVIS WITH CONTRAST TECHNIQUE: Multidetector CT imaging of the abdomen and pelvis was performed using the standard protocol following bolus administration of intravenous contrast. RADIATION DOSE REDUCTION: This exam was performed according to the departmental dose-optimization program which includes automated exposure control, adjustment of the mA and/or kV according to patient size and/or use of iterative reconstruction technique. CONTRAST:  80mL OMNIPAQUE IOHEXOL 300 MG/ML  SOLN COMPARISON:  X-ray and CT 05/04/2022 and older FINDINGS: Lower chest: There is some linear opacity seen along bases likely scar or atelectasis. No pleural effusion. Hepatobiliary: Diffuse fatty liver infiltration. Gallbladder is nondilated. Patent portal vein. Pancreas: Unremarkable. No pancreatic ductal dilatation or surrounding inflammatory changes. Spleen: Normal in size without focal abnormality. Adrenals/Urinary Tract: Adrenal glands are preserved. Mild bilateral renal atrophy. No collecting system dilatation or enhancing renal mass. The ureters have normal course and caliber extending down to the bladder. Bladder has a normal course and caliber. Stomach/Bowel: No oral contrast. Large bowel has a normal course and caliber with some scattered stool. Stomach is distended  with fluid. The duodenal and proximal and mid  jejunum are nondilated. This point the small bowel becomes more fluid-filled and distended. There is areas of small bowel stool appearance in the right lower quadrant, proximal ileum with diameter approaching up to 3.7 cm. There is a transition point to nondilated loop at this location best seen on coronal image 79 of series 7. This a similar location to the finding seen previously. Vascular/Lymphatic: Normal caliber aorta and IVC with some vascular calcifications. No specific abnormal lymph node enlargement seen in the abdomen and pelvis. Reproductive: Brachytherapy changes along the prostate. Other: Trace free fluid in the pelvis.  No free intra-air. Musculoskeletal: Scattered degenerative changes of the spine and pelvis. Trace anterolisthesis of L5 on S1 with pars defects. IMPRESSION: As on the prior CT examination there is mild dilatation of small bowel with a transition point in the right lower quadrant. Again a developing or partial obstruction is possible. No pneumatosis, free air. Only trace free fluid. Fatty liver infiltration. Electronically Signed   By: Karen Kays M.D.   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 His abdominal exam is overall benign. No role for emergent surgery today. Recommend bowel rest and IVF. SBO protocol for now. If he fails to improve would need surgery this admission. If he improves may discuss seeing if we could move up date of elective procedure since Dr. While is the on call surgeon next week.  - I updated his wife by phone as well   AKI - creatinine 1.26 GERD PMH prostate cancer   FEN - NPO, IVF; NGT to LIWS VTE - SCD's ID - none indicated from surgical standpoint Admit - TRH service   I reviewed ED provider notes, hospitalist notes, last 24 h vitals and pain scores, last 48 h intake and output, last 24  h labs and trends, and last 24 h imaging results.  Trixie Deis, [facility], [facility], [facility] 07/24/2022, 1:16 PM Please see Amion for pager number during day hours 7:00am-4:30pm or 7:00am -11:30am on weekends

## 2022-07-24 NOTE — ED Triage Notes (Signed)
Pt arrived via POV, c/o lower abd pain and vomiting. Denies any diarrhea, fevers. States scheduled for surgery in late August but pain and vomiting are worsening.

## 2022-07-24 NOTE — Plan of Care (Signed)

## 2022-07-24 NOTE — H&P (Signed)
History and Physical  Shawn Walsh ZHY:[id number] DOB: Jan 21, 1945 DOA: 07/24/2022  PCP: Daisy Floro, MD   Chief Complaint: abd pain, vomiting   HPI: Shawn Walsh is a 77 y.o. male with medical history significant for hyperlipidemia, nephrolithiasis, prostate cancer status post pelvic radiation with recurrent partial small bowel obstructions being admitted to the hospital with recurrent bowel obstruction.  Unfortunately he has had several hospital stays due to bowel obstruction, most recently in May of this year.  He has been followed as an outpatient by Dr. Cliffton Asters of general surgery, who planned laparoscopy and possible resection next month.  Patient has been doing well since his last hospital stay, but unfortunately last evening, started developing lower pelvic abdominal pain, with worsening nausea and intractable vomiting.  In the interim, he did have a normal bowel movement last night.  He therefore presented to the emergency department, where lab work and vital signs were relatively unremarkable but CT scan confirmed suspicion of early/partial small bowel obstruction.  Review of Systems: Please see HPI for pertinent positives and negatives. A complete 10 system review of systems are otherwise negative.  Past Medical History:  Diagnosis Date   Arthritis    MILD    COVID-19 virus infection    02-2020   History of kidney stones    HLD (hyperlipidemia)    Nephrolithiasis    per renal ultrasound 01-02-2019 in epic, left 11mm   Prostate cancer [facility]) urologist-- dr eskridge/  dr Kathrynn Running   dx 01-14-2019, Stage T1, Gleason 4+5   Wears glasses    Past Surgical History:  Procedure Laterality Date   ABDOMINAL ADHESION SURGERY  1976   [place]   APPENDECTOMY  1972   COLONOSCOPY  01/06/2002   normal   COLONOSCOPY  01/06/2002   CYSTOSCOPY N/A 05/13/2019   Procedure: CYSTOSCOPY FLEXIBLE;  Surgeon: Jerilee Field, MD;  Location: [facility];  Service: Urology;  Laterality:  N/A;  NO SEEDS FOUND IN BLADDER   CYSTOSCOPY/URETEROSCOPY/HOLMIUM LASER/STENT PLACEMENT Left 03/22/2022   Procedure: CYSTOSCOPY/ LEFT RETROGRADE/URETEROSCOPY/HOLMIUM LASER/STENT PLACEMENT;  Surgeon: Sebastian Ache, MD;  Location: [facility];  Service: Urology;  Laterality: Left;   EXTRACORPOREAL SHOCK WAVE LITHOTRIPSY Left 03/13/2022   Procedure: LEFT EXTRACORPOREAL SHOCK WAVE LITHOTRIPSY (ESWL);  Surgeon: Marcine Matar, MD;  Location: [facility];  Service: Urology;  Laterality: Left;  75 MINUTES   RADIOACTIVE SEED IMPLANT N/A 05/13/2019   Procedure: RADIOACTIVE SEED IMPLANT/BRACHYTHERAPY IMPLANT;  Surgeon: Jerilee Field, MD;  Location: [facility];  Service: Urology;  Laterality: N/A;   57  SEEDS IMPLANTED   SPACE OAR INSTILLATION N/A 05/13/2019   Procedure: SPACE OAR INSTILLATION;  Surgeon: Jerilee Field, MD;  Location: [facility];  Service: Urology;  Laterality: N/A;    Social History:  reports that he has never smoked. He has never been exposed to tobacco smoke. He has never used smokeless tobacco. He reports that he does not drink alcohol and does not use drugs.   Allergies  Allergen Reactions   Amoxicillin Nausea And Vomiting   Amoxicillin-Pot Clavulanate Nausea And Vomiting   Moxifloxacin Nausea And Vomiting   Nsaids Other (See Comments)    Elevated kidney "tests"    Family History  Problem Relation Age of Onset   Hyperlipidemia Brother    Heart attack Brother    Colon cancer Neg Hx    Prostate cancer Neg Hx    Pancreatic cancer Neg Hx    Breast cancer Neg Hx    Colon  polyps Neg Hx    Esophageal cancer Neg Hx      Prior to Admission medications   Medication Sig Start Date End Date Taking? Authorizing Provider  b complex vitamins capsule Take 1 capsule by mouth daily.    [provider]  Cholecalciferol (VITAMIN D3) 1000 UNITS CAPS Take 1,000 Units by mouth daily.    [provider]  mometasone  (ELOCON) 0.1 % cream Apply 1 Application topically daily as needed (for itching or redness- affected areas).    [provider]  Multiple Vitamins-Minerals (CENTRUM SILVER PO) Take 1 tablet by mouth daily with breakfast.    [provider]  omeprazole (PRILOSEC) 20 MG capsule Take 20 mg by mouth daily before breakfast.    [provider]  polyethylene glycol (MIRALAX / GLYCOLAX) 17 g packet Take 17 g by mouth daily as needed for moderate constipation. Available OTC Patient not taking: Reported on 05/04/2022 03/24/22   Rai, Delene Ruffini, MD  rosuvastatin (CRESTOR) 5 MG tablet Take 5 mg by mouth daily.    [provider]  tamsulosin (FLOMAX) 0.4 MG CAPS capsule Take 1 capsule (0.4 mg total) by mouth daily. Patient not taking: Reported on 04/04/2022 03/28/22   Terrilee Files, MD  TYLENOL 500 MG tablet Take 500 mg by mouth every 6 (six) hours as needed for mild pain or headache.    [provider]    Physical Exam: BP (!) 146/96 (BP Location: Right Arm)   Pulse 73   Temp 97.6 F (36.4 C) (Oral)   Resp 17   SpO2 95%   General:  Alert, oriented, calm, in no acute distress, looks very comfortable, NG tube in place with 600 cc watery bilious output Eyes: EOMI, clear conjuctivae, white sclerea Neck: supple, no masses, trachea mildline  Cardiovascular: RRR, no murmurs or rubs, no peripheral edema  Respiratory: clear to auscultation bilaterally, no wheezes, no crackles  Abdomen: soft, minimally tender, nondistended, normal bowel tones heard  Skin: dry, no rashes  Musculoskeletal: no joint effusions, normal range of motion  Psychiatric: appropriate affect, normal speech  Neurologic: extraocular muscles intact, clear speech, moving all extremities with intact sensorium          Labs on Admission:  Basic Metabolic Panel: Recent Labs  Lab 07/24/22 0926 07/24/22 0935  NA 138 140  K 4.1 4.3  CL 103 102  CO2 26  --   GLUCOSE 132* 126*  BUN 23 22   CREATININE 1.26* 1.30*  CALCIUM 9.6  --   MG 2.2  --    Liver Function Tests: Recent Labs  Lab 07/24/22 0926  AST 25  ALT 19  ALKPHOS 91  BILITOT 1.4*  PROT 8.1  ALBUMIN 4.2   Recent Labs  Lab 07/24/22 0926  LIPASE 32   No results for input(s): "AMMONIA" in the last 168 hours. CBC: Recent Labs  Lab 07/24/22 0926 07/24/22 0935  WBC 9.2  --   NEUTROABS 7.5  --   HGB 15.0 15.3  HCT 45.9 45.0  MCV 93.1  --   PLT 218  --    Cardiac Enzymes: No results for input(s): "CKTOTAL", "CKMB", "CKMBINDEX", "TROPONINI" in the last 168 hours.  BNP (last 3 results) No results for input(s): "BNP" in the last 8760 hours.  ProBNP (last 3 results) No results for input(s): "PROBNP" in the last 8760 hours.  CBG: No results for input(s): "GLUCAP" in the last 168 hours.  Radiological Exams on Admission: CT ABDOMEN PELVIS W  CONTRAST  Result Date: 07/24/2022 CLINICAL DATA:  Lower abdominal pain and vomiting. EXAM: CT ABDOMEN AND PELVIS WITH CONTRAST TECHNIQUE: Multidetector CT imaging of the abdomen and pelvis was performed using the standard protocol following bolus administration of intravenous contrast. RADIATION DOSE REDUCTION: This exam was performed according to the departmental dose-optimization program which includes automated exposure control, adjustment of the mA and/or kV according to patient size and/or use of iterative reconstruction technique. CONTRAST:  80mL OMNIPAQUE IOHEXOL 300 MG/ML  SOLN COMPARISON:  X-ray and CT 05/04/2022 and older FINDINGS: Lower chest: There is some linear opacity seen along bases likely scar or atelectasis. No pleural effusion. Hepatobiliary: Diffuse fatty liver infiltration. Gallbladder is nondilated. Patent portal vein. Pancreas: Unremarkable. No pancreatic ductal dilatation or surrounding inflammatory changes. Spleen: Normal in size without focal abnormality. Adrenals/Urinary Tract: Adrenal glands are preserved. Mild bilateral renal atrophy. No  collecting system dilatation or enhancing renal mass. The ureters have normal course and caliber extending down to the bladder. Bladder has a normal course and caliber. Stomach/Bowel: No oral contrast. Large bowel has a normal course and caliber with some scattered stool. Stomach is distended with fluid. The duodenal and proximal and mid jejunum are nondilated. This point the small bowel becomes more fluid-filled and distended. There is areas of small bowel stool appearance in the right lower quadrant, proximal ileum with diameter approaching up to 3.7 cm. There is a transition point to nondilated loop at this location best seen on coronal image 79 of series 7. This a similar location to the finding seen previously. Vascular/Lymphatic: Normal caliber aorta and IVC with some vascular calcifications. No specific abnormal lymph node enlargement seen in the abdomen and pelvis. Reproductive: Brachytherapy changes along the prostate. Other: Trace free fluid in the pelvis.  No free intra-air. Musculoskeletal: Scattered degenerative changes of the spine and pelvis. Trace anterolisthesis of L5 on S1 with pars defects. IMPRESSION: As on the prior CT examination there is mild dilatation of small bowel with a transition point in the right lower quadrant. Again a developing or partial obstruction is possible. No pneumatosis, free air. Only trace free fluid. Fatty liver infiltration. Electronically Signed   By: Karen Kays M.D.   On: 07/24/2022 11:07    Assessment/Plan This is a pleasant 77 year old gentleman with a history of hyperlipidemia, GERD, prostate cancer status post radiation with recent recurrent small bowel obstructions being admitted to the hospital with small bowel obstruction.  SBO-presumably this is due to a stricture related to his prior pelvic radiation, he has outpatient surgery scheduled with Dr. Cliffton Asters next month.  Unfortunately, he is having frequent recurrences.  Currently he is comfortable after NG  tube placement in the emergency department. -Inpatient admission to MedSurg -IV fluids -N.p.o. -Pain and nausea medications as needed -ER Dr. Durwin Nora has spoken with Shawn Deis, PA with general surgery, they will consult  AKI-in the setting of baseline normal renal function, likely ATN from relative dehydration from vomiting -Avoid nephrotoxins -Hydrate as above -Follow renal function with daily labs  GERD-Protonix IV  DVT prophylaxis: Lovenox     Code Status: Full Code  Consults called: General surgery  Admission status: The appropriate patient status for this patient is INPATIENT. Inpatient status is judged to be reasonable and necessary in order to provide the required intensity of service to ensure the patient's safety. The patient's presenting symptoms, physical exam findings, and initial radiographic and laboratory data in the context of their chronic comorbidities is felt to place them at high risk for  further clinical deterioration. Furthermore, it is not anticipated that the patient will be medically stable for discharge from the hospital within 2 midnights of admission.    I certify that at the point of admission it is my clinical judgment that the patient will require inpatient hospital care spanning beyond 2 midnights from the point of admission due to high intensity of service, high risk for further deterioration and high frequency of surveillance required  Time spent: 49 minutes  Lada Fulbright Sharlette Dense MD [facility] Pager [phone]  If 7PM-7AM, please contact night-coverage [email or web address] Password [facility]  07/24/2022, 12:50 PM

## 2022-07-24 NOTE — ED Notes (Signed)
Pt made aware of UA. Urinal at bedside

## 2022-07-24 NOTE — ED Provider Notes (Signed)
[place] EMERGENCY DEPARTMENT AT [facility] Provider Note   CSN: [id number] Arrival date & time: 07/24/22  0740     History  Chief Complaint  Patient presents with   Abdominal Pain    Shawn Walsh is a 77 y.o. male.   Abdominal Pain Associated symptoms: nausea and vomiting   Patient presents for abdominal pain and vomiting.  Medical history includes HLD, nephrolithiasis, arthritis, prostate cancer.  He had a recent admission for SBO 2 months ago that was resolved with conservative management.  That was his third episode of SBO.  He has undergone multiple abdominal surgeries in the past.  Yesterday, he was in his normal state of health.  Around 10 PM, he developed lower abdominal pain.  Pain is persistent throughout the night.  He had nausea, vomiting and p.o. intolerance.  He denies any current nausea but does state 6/10 severity ongoing lower abdominal pain.  He did have 2 bowel movements since the onset of pain but states that the pain is reminiscent of prior episodes of SBO.  He denies any recent urinary symptoms.  He has been told that his prostate cancer is in remission.     Home Medications Prior to Admission medications   Medication Sig Start Date End Date Taking? Authorizing Provider  b complex vitamins capsule Take 1 capsule by mouth daily.    [provider]  Cholecalciferol (VITAMIN D3) 1000 UNITS CAPS Take 1,000 Units by mouth daily.    [provider]  mometasone (ELOCON) 0.1 % cream Apply 1 Application topically daily as needed (for itching or redness- affected areas).    [provider]  Multiple Vitamins-Minerals (CENTRUM SILVER PO) Take 1 tablet by mouth daily with breakfast.    [provider]  omeprazole (PRILOSEC) 20 MG capsule Take 20 mg by mouth daily before breakfast.    [provider]  polyethylene glycol (MIRALAX / GLYCOLAX) 17 g packet Take 17 g by mouth daily as needed for moderate constipation.  Available OTC Patient not taking: Reported on 05/04/2022 03/24/22   Rai, Delene Ruffini, MD  rosuvastatin (CRESTOR) 5 MG tablet Take 5 mg by mouth daily.    [provider]  tamsulosin (FLOMAX) 0.4 MG CAPS capsule Take 1 capsule (0.4 mg total) by mouth daily. Patient not taking: Reported on 04/04/2022 03/28/22   Terrilee Files, MD  TYLENOL 500 MG tablet Take 500 mg by mouth every 6 (six) hours as needed for mild pain or headache.    [provider]      Allergies    Amoxicillin, Amoxicillin-pot clavulanate, Moxifloxacin, and Nsaids    Review of Systems   Review of Systems  Gastrointestinal:  Positive for abdominal pain, nausea and vomiting.  All other systems reviewed and are negative.   Physical Exam Updated Vital Signs BP (!) 127/92   Pulse 73   Temp 97.6 F (36.4 C) (Oral)   Resp 19   SpO2 97%  Physical Exam Vitals and nursing note reviewed.  Constitutional:      General: He is not in acute distress.    Appearance: He is well-developed. He is not ill-appearing or toxic-appearing.  HENT:     Head: Normocephalic and atraumatic.     Mouth/Throat:     Mouth: Mucous membranes are moist.  Eyes:     Conjunctiva/sclera: Conjunctivae normal.  Cardiovascular:     Rate and Rhythm: Normal rate and regular rhythm.     Heart sounds: No murmur heard. Pulmonary:  Effort: Pulmonary effort is normal. No respiratory distress.     Breath sounds: Normal breath sounds. No wheezing or rales.  Abdominal:     General: There is no distension.     Palpations: Abdomen is soft.     Tenderness: There is abdominal tenderness in the right lower quadrant, suprapubic area and left lower quadrant. There is no guarding or rebound.  Musculoskeletal:        General: No swelling.     Cervical back: Neck supple.  Skin:    General: Skin is warm and dry.     Coloration: Skin is not cyanotic, jaundiced or pale.  Neurological:     General: No focal deficit present.     Mental Status: He is  alert and oriented to person, place, and time.  Psychiatric:        Mood and Affect: Mood normal.        Behavior: Behavior normal.     ED Results / Procedures / Treatments   Labs (all labs ordered are listed, but only abnormal results are displayed) Labs Reviewed  COMPREHENSIVE METABOLIC PANEL - Abnormal; Notable for the following components:      Result Value   Glucose, Bld 132 (*)    Creatinine, Ser 1.26 (*)    Total Bilirubin 1.4 (*)    GFR, Estimated 59 (*)    All other components within normal limits  I-STAT CHEM 8, ED - Abnormal; Notable for the following components:   Creatinine, Ser 1.30 (*)    Glucose, Bld 126 (*)    All other components within normal limits  LIPASE, BLOOD  CBC WITH DIFFERENTIAL/PLATELET  MAGNESIUM  URINALYSIS, ROUTINE W REFLEX MICROSCOPIC    EKG EKG Interpretation Date/Time:  Monday July 24 2022 08:59:12 EDT Ventricular Rate:  77 PR Interval:  186 QRS Duration:  82 QT Interval:  388 QTC Calculation: 440 R Axis:   46  Text Interpretation: Sinus rhythm Abnormal R-wave progression, early transition Confirmed by Gloris Manchester ([id number]) on 07/24/2022 9:22:30 AM  Radiology CT ABDOMEN PELVIS W CONTRAST  Result Date: 07/24/2022 CLINICAL DATA:  Lower abdominal pain and vomiting. EXAM: CT ABDOMEN AND PELVIS WITH CONTRAST TECHNIQUE: Multidetector CT imaging of the abdomen and pelvis was performed using the standard protocol following bolus administration of intravenous contrast. RADIATION DOSE REDUCTION: This exam was performed according to the departmental dose-optimization program which includes automated exposure control, adjustment of the mA and/or kV according to patient size and/or use of iterative reconstruction technique. CONTRAST:  80mL OMNIPAQUE IOHEXOL 300 MG/ML  SOLN COMPARISON:  X-ray and CT 05/04/2022 and older FINDINGS: Lower chest: There is some linear opacity seen along bases likely scar or atelectasis. No pleural effusion. Hepatobiliary: Diffuse  fatty liver infiltration. Gallbladder is nondilated. Patent portal vein. Pancreas: Unremarkable. No pancreatic ductal dilatation or surrounding inflammatory changes. Spleen: Normal in size without focal abnormality. Adrenals/Urinary Tract: Adrenal glands are preserved. Mild bilateral renal atrophy. No collecting system dilatation or enhancing renal mass. The ureters have normal course and caliber extending down to the bladder. Bladder has a normal course and caliber. Stomach/Bowel: No oral contrast. Large bowel has a normal course and caliber with some scattered stool. Stomach is distended with fluid. The duodenal and proximal and mid jejunum are nondilated. This point the small bowel becomes more fluid-filled and distended. There is areas of small bowel stool appearance in the right lower quadrant, proximal ileum with diameter approaching up to 3.7 cm. There is a transition point to nondilated loop at  this location best seen on coronal image 79 of series 7. This a similar location to the finding seen previously. Vascular/Lymphatic: Normal caliber aorta and IVC with some vascular calcifications. No specific abnormal lymph node enlargement seen in the abdomen and pelvis. Reproductive: Brachytherapy changes along the prostate. Other: Trace free fluid in the pelvis.  No free intra-air. Musculoskeletal: Scattered degenerative changes of the spine and pelvis. Trace anterolisthesis of L5 on S1 with pars defects. IMPRESSION: As on the prior CT examination there is mild dilatation of small bowel with a transition point in the right lower quadrant. Again a developing or partial obstruction is possible. No pneumatosis, free air. Only trace free fluid. Fatty liver infiltration. Electronically Signed   By: Karen Kays M.D.   On: 07/24/2022 11:07    Procedures Procedures    Medications Ordered in ED Medications  ondansetron (ZOFRAN) injection 4 mg (4 mg Intravenous Given 07/24/22 0923)  lactated ringers bolus 1,000 mL  (0 mLs Intravenous Stopped 07/24/22 1130)  HYDROmorphone (DILAUDID) injection 0.5 mg (0.5 mg Intravenous Given 07/24/22 0923)  iohexol (OMNIPAQUE) 300 MG/ML solution 80 mL (80 mLs Intravenous Contrast Given 07/24/22 1028)    ED Course/ Medical Decision Making/ A&P                             Medical Decision Making Amount and/or Complexity of Data Reviewed Labs: ordered. Radiology: ordered.  Risk Prescription drug management. Decision regarding hospitalization.   This patient presents to the ED for concern of abdominal pain, this involves an extensive number of treatment options, and is a complaint that carries with it a high risk of complications and morbidity.  The differential diagnosis includes SBO, constipation, colitis, UTI   Co morbidities that complicate the patient evaluation  HLD, nephrolithiasis, arthritis, prostate cancer   Additional history obtained:  Additional history obtained from N/A External records from outside source obtained and reviewed including EMR   Lab Tests:  I Ordered, and personally interpreted labs.  The pertinent results include: Creatinine slightly increased from baseline, normal electrolytes, normal hemoglobin, no leukocytosis   Imaging Studies ordered:  I ordered imaging studies including CT of abdomen and pelvis I independently visualized and interpreted imaging which showed findings concerning for developing or partial obstruction with transition point in right lower quadrant I agree with the radiologist interpretation   Cardiac Monitoring: / EKG:  The patient was maintained on a cardiac monitor.  I personally viewed and interpreted the cardiac monitored which showed an underlying rhythm of: Sinus rhythm   Consultations Obtained:  I requested consultation with general surgery,  and discussed lab and imaging findings as well as pertinent plan - they recommend: Will see in consult   Problem List / ED Course / Critical  interventions / Medication management  Patient presents for lower abdominal pain, nausea, vomiting and p.o. intolerance since last night.  His symptoms are reminiscent of prior episodes of SBO.  Vital signs are normal on arrival.  Patient is well-appearing on exam.  His abdomen is soft.  He describes lower abdominal tenderness.  Bowel sounds are hyperactive.  Dilaudid was ordered for analgesia.  Workup was initiated.  CT scan showed developing or partial obstruction with transition point in right lower quadrant.  NG tube was ordered.  Patient was admitted for further management. I ordered medication including Dilaudid for analgesia; Zofran for nausea; IV fluids for hydration Reevaluation of the patient after these medicines showed that the patient  improved I have reviewed the patients home medicines and have made adjustments as needed   Social Determinants of Health:  Has access to outpatient care        Final Clinical Impression(s) / ED Diagnoses Final diagnoses:  Partial small bowel obstruction [facility])    Rx / DC Orders ED Discharge Orders     None         Gloris Manchester, MD 07/24/22 1257

## 2022-07-25 ENCOUNTER — Inpatient Hospital Stay (HOSPITAL_COMMUNITY): Payer: Medicare Other

## 2022-07-25 DIAGNOSIS — K56609 Unspecified intestinal obstruction, unspecified as to partial versus complete obstruction: Secondary | ICD-10-CM | POA: Diagnosis not present

## 2022-07-25 LAB — BASIC METABOLIC PANEL
Anion gap: 6 (ref 5–15)
BUN: 20 mg/dL (ref 8–23)
CO2: 27 mmol/L (ref 22–32)
Calcium: 8.6 mg/dL — ABNORMAL LOW (ref 8.9–10.3)
Chloride: 107 mmol/L (ref 98–111)
Creatinine, Ser: 1.11 mg/dL (ref 0.61–1.24)
GFR, Estimated: >60 mL/min (ref >60)
Glucose, Bld: 109 mg/dL — ABNORMAL HIGH (ref 70–99)
Potassium: 4.3 mmol/L (ref 3.5–5.1)
Sodium: 140 mmol/L (ref 135–145)

## 2022-07-25 LAB — CBC
HCT: 38.9 % — ABNORMAL LOW (ref 39.0–52.0)
Hemoglobin: 12.5 g/dL — ABNORMAL LOW (ref 13.0–17.0)
MCH: 30.5 pg (ref 26.0–34.0)
MCHC: 32.1 g/dL (ref 30.0–36.0)
MCV: 94.9 fL (ref 80.0–100.0)
Platelets: 178 10*3/uL (ref 150–400)
RBC: 4.1 MIL/uL — ABNORMAL LOW (ref 4.22–5.81)
RDW: 14.6 % (ref 11.5–15.5)
WBC: 7.4 10*3/uL (ref 4.0–10.5)
nRBC: 0 % (ref 0.0–0.2)

## 2022-07-25 LAB — CULTURE, BLOOD (ROUTINE X 2): Special Requests: ADEQUATE

## 2022-07-25 MED ORDER — ACETAMINOPHEN 10 MG/ML IV SOLN
1000.0000 mg | Freq: Once | INTRAVENOUS | Status: AC
  Administered 2022-07-25: 1000 mg via INTRAVENOUS
  Filled 2022-07-25: qty 100

## 2022-07-25 NOTE — Progress Notes (Signed)
Patient ID: Shawn Walsh, male   DOB: 1945-01-08, 77 y.o.   MRN: [id number] [facility] Surgery Progress Note     Subjective: CC-  Feels about the same today. Continues to complain of bloating and some left sided abdominal pain. No flatus or BM. NG manipulated after xray this morning and is now working.   Objective: Vital signs in last 24 hours: Temp:  [97.6 F (36.4 C)-98.2 F (36.8 C)] 98.1 F (36.7 C) (07/23 0422) Pulse Rate:  [65-80] 80 (07/23 0422) Resp:  [16-22] 18 (07/23 0422) BP: (127-152)/(81-96) 151/86 (07/23 0422) SpO2:  [93 %-98 %] 93 % (07/23 0422) Weight:  [67.6 kg] 67.6 kg (07/22 1801) Last BM Date : 07/24/22  Intake/Output from previous day: 07/22 0701 - 07/23 0700 In: 1300 [I.V.:300; IV Piggyback:1000] Out: -  Intake/Output this shift: No intake/output data recorded.  PE: Gen:  Alert, NAD, pleasant Pulm:  rate and effort normal on room air Abd: distended but soft, mild left sided abdominal tenderness without rebound or guarding Psych: A&Ox4  Skin: no rashes noted, warm and dry  Lab Results:  Recent Labs    07/24/22 0926 07/24/22 0935 07/25/22 0356  WBC 9.2  --  7.4  HGB 15.0 15.3 12.5*  HCT 45.9 45.0 38.9*  PLT 218  --  178   BMET Recent Labs    07/24/22 0926 07/24/22 0935 07/25/22 0356  NA 138 140 140  K 4.1 4.3 4.3  CL 103 102 107  CO2 26  --  27  GLUCOSE 132* 126* 109*  BUN 23 22 20   CREATININE 1.26* 1.30* 1.11  CALCIUM 9.6  --  8.6*   PT/INR No results for input(s): "LABPROT", "INR" in the last 72 hours. CMP     Component Value Date/Time   NA 140 07/25/2022 0356   K 4.3 07/25/2022 0356   CL 107 07/25/2022 0356   CO2 27 07/25/2022 0356   GLUCOSE 109 (H) 07/25/2022 0356   BUN 20 07/25/2022 0356   CREATININE 1.11 07/25/2022 0356   CALCIUM 8.6 (L) 07/25/2022 0356   PROT 8.1 07/24/2022 0926   ALBUMIN 4.2 07/24/2022 0926   AST 25 07/24/2022 0926   ALT 19 07/24/2022 0926   ALKPHOS 91 07/24/2022 0926   BILITOT 1.4 (H)  07/24/2022 0926   GFRNONAA >60 07/25/2022 0356   GFRAA >60 05/09/2019 1110   Lipase     Component Value Date/Time   LIPASE 32 07/24/2022 0926       Studies/Results: DG Abd Portable 1V-Small Bowel Obstruction Protocol-initial, 8 hr delay  Result Date: 07/25/2022 CLINICAL DATA:  8 hour small-bowel follow-up film EXAM: PORTABLE ABDOMEN - 1 VIEW COMPARISON:  Film from the previous day. FINDINGS: Gastric catheter is not visualized although may be tapered off the film. Persistent small bowel dilatation is noted with contrast noted within the small bowel. No colonic contrast is noted. 24 hour follow-up film is recommended. IMPRESSION: Persistent small bowel dilatation with contrast material within. 24 hour follow-up film is recommended. Electronically Signed   By: Alcide Clever M.D.   On: 07/25/2022 01:01   DG Abd Portable 1 View  Result Date: 07/24/2022 CLINICAL DATA:  NG tube placement EXAM: PORTABLE ABDOMEN - 1 VIEW limited for tube placement COMPARISON:  X-ray 05/05/2022.  CT earlier 07/24/2022 FINDINGS: Limited x-ray of the upper abdomen and lower chest demonstrates enteric tube with tip overlying the upper stomach. Side hole near the GE junction. This could be advanced further into the stomach. Once again there are  some dilated air-filled loops of small bowel in the midabdomen. Left basilar lung atelectasis. IMPRESSION: Limited x-ray for tube placement has tip overlying the upper stomach with side hole at the GE junction. This could be advanced further into the stomach Electronically Signed   By: Karen Kays M.D.   On: 07/24/2022 13:01   CT ABDOMEN PELVIS W CONTRAST  Result Date: 07/24/2022 CLINICAL DATA:  Lower abdominal pain and vomiting. EXAM: CT ABDOMEN AND PELVIS WITH CONTRAST TECHNIQUE: Multidetector CT imaging of the abdomen and pelvis was performed using the standard protocol following bolus administration of intravenous contrast. RADIATION DOSE REDUCTION: This exam was performed  according to the departmental dose-optimization program which includes automated exposure control, adjustment of the mA and/or kV according to patient size and/or use of iterative reconstruction technique. CONTRAST:  80mL OMNIPAQUE IOHEXOL 300 MG/ML  SOLN COMPARISON:  X-ray and CT 05/04/2022 and older FINDINGS: Lower chest: There is some linear opacity seen along bases likely scar or atelectasis. No pleural effusion. Hepatobiliary: Diffuse fatty liver infiltration. Gallbladder is nondilated. Patent portal vein. Pancreas: Unremarkable. No pancreatic ductal dilatation or surrounding inflammatory changes. Spleen: Normal in size without focal abnormality. Adrenals/Urinary Tract: Adrenal glands are preserved. Mild bilateral renal atrophy. No collecting system dilatation or enhancing renal mass. The ureters have normal course and caliber extending down to the bladder. Bladder has a normal course and caliber. Stomach/Bowel: No oral contrast. Large bowel has a normal course and caliber with some scattered stool. Stomach is distended with fluid. The duodenal and proximal and mid jejunum are nondilated. This point the small bowel becomes more fluid-filled and distended. There is areas of small bowel stool appearance in the right lower quadrant, proximal ileum with diameter approaching up to 3.7 cm. There is a transition point to nondilated loop at this location best seen on coronal image 79 of series 7. This a similar location to the finding seen previously. Vascular/Lymphatic: Normal caliber aorta and IVC with some vascular calcifications. No specific abnormal lymph node enlargement seen in the abdomen and pelvis. Reproductive: Brachytherapy changes along the prostate. Other: Trace free fluid in the pelvis.  No free intra-air. Musculoskeletal: Scattered degenerative changes of the spine and pelvis. Trace anterolisthesis of L5 on S1 with pars defects. IMPRESSION: As on the prior CT examination there is mild dilatation of  small bowel with a transition point in the right lower quadrant. Again a developing or partial obstruction is possible. No pneumatosis, free air. Only trace free fluid. Fatty liver infiltration. Electronically Signed   By: Karen Kays M.D.   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 NG was manipulated after the film and is working better now. Will repeat film this afternoon to reassess SBO and NG placement. Continue NG to LIWS. If he fails to improve would need surgery this admission. If he improves may discuss seeing if we could move up date of elective procedure since Dr. While is the on call surgeon next week.    AKI - creatinine 1.26 GERD PMH prostate cancer    FEN - NPO, IVF; NGT to LIWS VTE - SCD's, lovenox ID -  none indicated from surgical standpoint  I reviewed hospitalist notes, last 24 h vitals and pain scores, last 48 h intake and output, last 24 h labs and trends, and last 24 h imaging results.    LOS: 1 day    Franne Forts, [facility] 07/25/2022, 10:29 AM Please see Amion for pager number during day hours 7:00am-4:30pm

## 2022-07-25 NOTE — Plan of Care (Signed)
  Problem: Education: Goal: Knowledge of General Education information will improve Description: Including pain rating scale, medication(s)/side effects and non-pharmacologic comfort measures Outcome: Progressing   Problem: Clinical Measurements: Goal: Ability to maintain clinical measurements within normal limits will improve Outcome: Progressing Goal: Cardiovascular complication will be avoided Outcome: Progressing   Problem: Elimination: Goal: Will not experience complications related to urinary retention Outcome: Progressing   Problem: Safety: Goal: Ability to remain free from injury will improve Outcome: Progressing   Problem: Skin Integrity: Goal: Risk for impaired skin integrity will decrease Outcome: Progressing

## 2022-07-25 NOTE — Progress Notes (Signed)
Transition of Care [facility]) - Inpatient Brief Assessment   Patient Details  Name: Shawn Walsh MRN: [id number] Date of Birth: [birth year]  Transition of Care [facility]) CM/SW Contact:    Coralyn Helling, LCSW Phone Number: 07/25/2022, 9:32 AM   Clinical Narrative: Patient screened. No needs identified, however TOC will follow for needs pending hospital course.     Transition of Care Asessment: Insurance and Status: (P) Insurance coverage has been reviewed Patient has primary care physician: (P) Yes Home environment has been reviewed: (P) Home with sposue Prior level of function:: (P) independent Prior/Current Home Services: (P) No current home services Social Determinants of Health Reivew: (P) SDOH reviewed no interventions necessary Readmission risk has been reviewed: (P) No (yellow) Transition of care needs: (P) transition of care needs identified, TOC will continue to follow

## 2022-07-25 NOTE — Progress Notes (Signed)
PROGRESS NOTE  Shawn Walsh HQI:[id number] DOB: [birth year] DOA: 07/24/2022 PCP: Daisy Floro, MD   LOS: 1 day   Brief Narrative / Interim history: 77 year old male with history of hyperlipidemia, nephrolithiasis, prostate cancer status post pelvic radiation with recurrent SBOs comes to the hospital with abdominal discomfort, nausea vomiting consistent with his prior bowel obstructions.  He is followed up with surgery on a regular basis and there are plans in place for ex lap at the end of August.  CT scan confirmed early/partial small bowel obstruction.  General surgery was consulted and he was admitted to the hospital  Subjective / 24h Interval events: He does not feel good this morning, has had retching this morning and abdominal discomfort.  No BMs or passing gas  Assesement and Plan: Principal Problem:   SBO (small bowel obstruction) (HCC)  Principal problem Small bowel obstruction -recurrent, continue supportive care now with fluids, n.p.o., NG tube -Surgery following  Active problems AKI-due to poor p.o. intake, creatinine normalized with fluids.  Hyperlipidemia-hold statin due to strict n.p.o.  History of prostate cancer-hold tamsulosin due to strict n.p.o.  GERD-continue PPI IV  Scheduled Meds:  enoxaparin (LOVENOX) injection  40 mg Subcutaneous Daily   pantoprazole (PROTONIX) IV  40 mg Intravenous Daily   Continuous Infusions:  sodium chloride 125 mL/hr at 07/25/22 0550   PRN Meds:.acetaminophen **OR** acetaminophen, albuterol, HYDROmorphone (DILAUDID) injection, metoprolol tartrate, ondansetron **OR** ondansetron (ZOFRAN) IV, traZODone  Current Outpatient Medications  Medication Instructions   b complex vitamins capsule 1 capsule, Oral, Daily   mometasone (ELOCON) 0.1 % cream 1 Application, Topical, Daily PRN   Multiple Vitamins-Minerals (CENTRUM SILVER PO) 1 tablet, Oral, Daily with breakfast   omeprazole (PRILOSEC) 20 mg, Oral, Daily before breakfast    polyethylene glycol (MIRALAX / GLYCOLAX) 17 g, Oral, Daily PRN, Available OTC   rosuvastatin (CRESTOR) 5 mg, Oral, Daily   tamsulosin (FLOMAX) 0.4 mg, Oral, Daily   Testosterone 1.62 % GEL 1 Application, Daily   TYLENOL 500 mg, Oral, Every 6 hours PRN   Vitamin D3 1,000 Units, Oral, Daily    Diet Orders (From admission, onward)     Start     Ordered   07/24/22 1228  Diet NPO time specified Except for: Sips with Meds  Diet effective now       Question:  Except for  Answer:  Sips with Meds   07/24/22 1228            DVT prophylaxis: enoxaparin (LOVENOX) injection 40 mg Start: 07/24/22 1330 SCDs Start: 07/24/22 1228   Lab Results  Component Value Date   PLT 178 07/25/2022      Code Status: Full Code  Family Communication: no family at bedside  Status is: Inpatient Remains inpatient appropriate because: persistent symptoms  Level of care: Med-Surg  Consultants:  General surgery   Objective: Vitals:   07/24/22 1801 07/24/22 1942 07/25/22 0004 07/25/22 0422  BP: (!) 140/81 130/83 133/83 (!) 151/86  Pulse: 73 65 74 80  Resp: 16 18 18 18   Temp: 98.1 F (36.7 C) 97.9 F (36.6 C) 98.2 F (36.8 C) 98.1 F (36.7 C)  TempSrc: Oral Oral Oral Oral  SpO2: 98% 98% 96% 93%  Weight: 67.6 kg     Height: 5' 7.99" (1.727 m)       Intake/Output Summary (Last 24 hours) at 07/25/2022 0940 Last data filed at 07/24/2022 1800 Gross per 24 hour  Intake 1300 ml  Output --  Net 1300 ml  Wt Readings from Last 3 Encounters:  07/24/22 67.6 kg  05/04/22 68.5 kg  04/04/22 70.3 kg    Examination:  Constitutional: NAD Eyes: no scleral icterus ENMT: Mucous membranes are moist.  Neck: normal, supple Respiratory: clear to auscultation bilaterally, no wheezing, no crackles. Normal respiratory effort. No accessory muscle use.  Cardiovascular: Regular rate and rhythm, no murmurs / rubs / gallops. No LE edema.  Abdomen: non distended, mild tenderness RLQ. Bowel sounds positive.   Musculoskeletal: no clubbing / cyanosis.    Data Reviewed: I have independently reviewed following labs and imaging studies   CBC Recent Labs  Lab 07/24/22 0926 07/24/22 0935 07/25/22 0356  WBC 9.2  --  7.4  HGB 15.0 15.3 12.5*  HCT 45.9 45.0 38.9*  PLT 218  --  178  MCV 93.1  --  94.9  MCH 30.4  --  30.5  MCHC 32.7  --  32.1  RDW 14.1  --  14.6  LYMPHSABS 1.0  --   --   MONOABS 0.6  --   --   EOSABS 0.0  --   --   BASOSABS 0.0  --   --     Recent Labs  Lab 07/24/22 0926 07/24/22 0935 07/25/22 0356  NA 138 140 140  K 4.1 4.3 4.3  CL 103 102 107  CO2 26  --  27  GLUCOSE 132* 126* 109*  BUN 23 22 20   CREATININE 1.26* 1.30* 1.11  CALCIUM 9.6  --  8.6*  AST 25  --   --   ALT 19  --   --   ALKPHOS 91  --   --   BILITOT 1.4*  --   --   ALBUMIN 4.2  --   --   MG 2.2  --   --     ------------------------------------------------------------------------------------------------------------------ No results for input(s): "CHOL", "HDL", "LDLCALC", "TRIG", "CHOLHDL", "LDLDIRECT" in the last 72 hours.  No results found for: "HGBA1C" ------------------------------------------------------------------------------------------------------------------ No results for input(s): "TSH", "T4TOTAL", "T3FREE", "THYROIDAB" in the last 72 hours.  Invalid input(s): "FREET3"  Cardiac Enzymes No results for input(s): "CKMB", "TROPONINI", "MYOGLOBIN" in the last 168 hours.  Invalid input(s): "CK" ------------------------------------------------------------------------------------------------------------------ No results found for: "BNP"  CBG: No results for input(s): "GLUCAP" in the last 168 hours.  No results found for this or any previous visit (from the past 240 hour(s)).   Radiology Studies: DG Abd Portable 1V-Small Bowel Obstruction Protocol-initial, 8 hr delay  Result Date: 07/25/2022 CLINICAL DATA:  8 hour small-bowel follow-up film EXAM: PORTABLE ABDOMEN - 1 VIEW  COMPARISON:  Film from the previous day. FINDINGS: Gastric catheter is not visualized although may be tapered off the film. Persistent small bowel dilatation is noted with contrast noted within the small bowel. No colonic contrast is noted. 24 hour follow-up film is recommended. IMPRESSION: Persistent small bowel dilatation with contrast material within. 24 hour follow-up film is recommended. Electronically Signed   By: Alcide Clever M.D.   On: 07/25/2022 01:01   DG Abd Portable 1 View  Result Date: 07/24/2022 CLINICAL DATA:  NG tube placement EXAM: PORTABLE ABDOMEN - 1 VIEW limited for tube placement COMPARISON:  X-ray 05/05/2022.  CT earlier 07/24/2022 FINDINGS: Limited x-ray of the upper abdomen and lower chest demonstrates enteric tube with tip overlying the upper stomach. Side hole near the GE junction. This could be advanced further into the stomach. Once again there are some dilated air-filled loops of small bowel in the midabdomen. Left basilar lung atelectasis. IMPRESSION:  Limited x-ray for tube placement has tip overlying the upper stomach with side hole at the GE junction. This could be advanced further into the stomach Electronically Signed   By: Karen Kays M.D.   On: 07/24/2022 13:01   CT ABDOMEN PELVIS W CONTRAST  Result Date: 07/24/2022 CLINICAL DATA:  Lower abdominal pain and vomiting. EXAM: CT ABDOMEN AND PELVIS WITH CONTRAST TECHNIQUE: Multidetector CT imaging of the abdomen and pelvis was performed using the standard protocol following bolus administration of intravenous contrast. RADIATION DOSE REDUCTION: This exam was performed according to the departmental dose-optimization program which includes automated exposure control, adjustment of the mA and/or kV according to patient size and/or use of iterative reconstruction technique. CONTRAST:  80mL OMNIPAQUE IOHEXOL 300 MG/ML  SOLN COMPARISON:  X-ray and CT 05/04/2022 and older FINDINGS: Lower chest: There is some linear opacity seen along  bases likely scar or atelectasis. No pleural effusion. Hepatobiliary: Diffuse fatty liver infiltration. Gallbladder is nondilated. Patent portal vein. Pancreas: Unremarkable. No pancreatic ductal dilatation or surrounding inflammatory changes. Spleen: Normal in size without focal abnormality. Adrenals/Urinary Tract: Adrenal glands are preserved. Mild bilateral renal atrophy. No collecting system dilatation or enhancing renal mass. The ureters have normal course and caliber extending down to the bladder. Bladder has a normal course and caliber. Stomach/Bowel: No oral contrast. Large bowel has a normal course and caliber with some scattered stool. Stomach is distended with fluid. The duodenal and proximal and mid jejunum are nondilated. This point the small bowel becomes more fluid-filled and distended. There is areas of small bowel stool appearance in the right lower quadrant, proximal ileum with diameter approaching up to 3.7 cm. There is a transition point to nondilated loop at this location best seen on coronal image 79 of series 7. This a similar location to the finding seen previously. Vascular/Lymphatic: Normal caliber aorta and IVC with some vascular calcifications. No specific abnormal lymph node enlargement seen in the abdomen and pelvis. Reproductive: Brachytherapy changes along the prostate. Other: Trace free fluid in the pelvis.  No free intra-air. Musculoskeletal: Scattered degenerative changes of the spine and pelvis. Trace anterolisthesis of L5 on S1 with pars defects. IMPRESSION: As on the prior CT examination there is mild dilatation of small bowel with a transition point in the right lower quadrant. Again a developing or partial obstruction is possible. No pneumatosis, free air. Only trace free fluid. Fatty liver infiltration. Electronically Signed   By: Karen Kays M.D.   On: 07/24/2022 11:07     Pamella Pert, MD, PhD [facility]  Between 7 am - 7 pm I am available, please contact  me via Amion (for emergencies) or Securechat (non urgent messages)  Between 7 pm - 7 am I am not available, please contact night coverage MD/APP via Amion

## 2022-07-26 DIAGNOSIS — K56609 Unspecified intestinal obstruction, unspecified as to partial versus complete obstruction: Secondary | ICD-10-CM | POA: Diagnosis not present

## 2022-07-26 LAB — COMPREHENSIVE METABOLIC PANEL
ALT: 14 U/L (ref 0–44)
AST: 17 U/L (ref 15–41)
Albumin: 2.9 g/dL — ABNORMAL LOW (ref 3.5–5.0)
Alkaline Phosphatase: 62 U/L (ref 38–126)
Anion gap: 8 (ref 5–15)
BUN: 18 mg/dL (ref 8–23)
CO2: 23 mmol/L (ref 22–32)
Calcium: 8.1 mg/dL — ABNORMAL LOW (ref 8.9–10.3)
Chloride: 109 mmol/L (ref 98–111)
Creatinine, Ser: 1.1 mg/dL (ref 0.61–1.24)
GFR, Estimated: >60 mL/min (ref >60)
Glucose, Bld: 94 mg/dL (ref 70–99)
Potassium: 3.5 mmol/L (ref 3.5–5.1)
Sodium: 140 mmol/L (ref 135–145)
Total Bilirubin: 1.8 mg/dL — ABNORMAL HIGH (ref 0.3–1.2)
Total Protein: 5.7 g/dL — ABNORMAL LOW (ref 6.5–8.1)

## 2022-07-26 LAB — MAGNESIUM: Magnesium: 1.8 mg/dL (ref 1.7–2.4)

## 2022-07-26 LAB — CBC
HCT: 33.4 % — ABNORMAL LOW (ref 39.0–52.0)
Hemoglobin: 10.6 g/dL — ABNORMAL LOW (ref 13.0–17.0)
MCH: 30.6 pg (ref 26.0–34.0)
MCHC: 31.7 g/dL (ref 30.0–36.0)
MCV: 96.5 fL (ref 80.0–100.0)
Platelets: 144 10*3/uL — ABNORMAL LOW (ref 150–400)
RBC: 3.46 MIL/uL — ABNORMAL LOW (ref 4.22–5.81)
RDW: 14.9 % (ref 11.5–15.5)
WBC: 8.9 10*3/uL (ref 4.0–10.5)
nRBC: 0 % (ref 0.0–0.2)

## 2022-07-26 LAB — CULTURE, BLOOD (ROUTINE X 2): Culture: NO GROWTH

## 2022-07-26 MED ORDER — MAGNESIUM SULFATE 2 GM/50ML IV SOLN
2.0000 g | Freq: Once | INTRAVENOUS | Status: AC
  Administered 2022-07-26: 2 g via INTRAVENOUS
  Filled 2022-07-26: qty 50

## 2022-07-26 MED ORDER — PHENOL 1.4 % MT LIQD
1.0000 | OROMUCOSAL | Status: DC | PRN
  Administered 2022-07-26 (×2): 1 via OROMUCOSAL
  Filled 2022-07-26: qty 177

## 2022-07-26 MED ORDER — POTASSIUM CHLORIDE CRYS ER 20 MEQ PO TBCR
20.0000 meq | EXTENDED_RELEASE_TABLET | Freq: Once | ORAL | Status: AC
  Administered 2022-07-26: 20 meq via ORAL
  Filled 2022-07-26: qty 1

## 2022-07-26 NOTE — Progress Notes (Signed)
Patient ID: Shawn Walsh, male   DOB: 09-May-1945, 77 y.o.   MRN: [id number] [facility] Surgery Progress Note     Subjective: CC-  Feeling better than yesterday. Started passing flatus over night. No BM. Denies n/v. NG output slowed down over night. Follow up film yesterday showed contrast in colon.  Objective: Vital signs in last 24 hours: Temp:  [97.9 F (36.6 C)-101.4 F (38.6 C)] 98.7 F (37.1 C) (07/24 0416) Pulse Rate:  [74-97] 84 (07/24 0416) Resp:  [16-18] 18 (07/24 0416) BP: (129-152)/(63-75) 141/72 (07/24 0416) SpO2:  [94 %-96 %] 95 % (07/24 0416) Last BM Date : 07/23/22  Intake/Output from previous day: 07/23 0701 - 07/24 0700 In: 4661 [I.V.:4571; NG/GT:90] Out: 1150 [Urine:450; Emesis/NG output:700] Intake/Output this shift: Total I/O In: -  Out: 150 [Urine:150]  PE: Gen:  Alert, NAD, pleasant Abd: soft, mild distension, nontender Psych: A&Ox4  Skin: no rashes noted, warm and dry  Lab Results:  Recent Labs    07/25/22 0356 07/26/22 0349  WBC 7.4 8.9  HGB 12.5* 10.6*  HCT 38.9* 33.4*  PLT 178 144*   BMET Recent Labs    07/25/22 0356 07/26/22 0349  NA 140 140  K 4.3 3.5  CL 107 109  CO2 27 23  GLUCOSE 109* 94  BUN 20 18  CREATININE 1.11 1.10  CALCIUM 8.6* 8.1*   PT/INR No results for input(s): "LABPROT", "INR" in the last 72 hours. CMP     Component Value Date/Time   NA 140 07/26/2022 0349   K 3.5 07/26/2022 0349   CL 109 07/26/2022 0349   CO2 23 07/26/2022 0349   GLUCOSE 94 07/26/2022 0349   BUN 18 07/26/2022 0349   CREATININE 1.10 07/26/2022 0349   CALCIUM 8.1 (L) 07/26/2022 0349   PROT 5.7 (L) 07/26/2022 0349   ALBUMIN 2.9 (L) 07/26/2022 0349   AST 17 07/26/2022 0349   ALT 14 07/26/2022 0349   ALKPHOS 62 07/26/2022 0349   BILITOT 1.8 (H) 07/26/2022 0349   GFRNONAA >60 07/26/2022 0349   GFRAA >60 05/09/2019 1110   Lipase     Component Value Date/Time   LIPASE 32 07/24/2022 0926       Studies/Results: DG Abd  Portable 1V  Result Date: 07/25/2022 CLINICAL DATA:  Bowel obstruction NG tube EXAM: PORTABLE ABDOMEN - 1 VIEW COMPARISON:  07/25/2022 FINDINGS: Esophageal tube tip overlies proximal stomach, side-port in the region of GE junction. Enteral contrast within the colon and rectum. Residual central small bowel distension measuring up to 3.3 cm. IMPRESSION: 1. Esophageal tube tip overlies proximal stomach, side-port in the region of GE junction. Consider further advancement by 5-10 cm for more optimal positioning 2. Residual central small bowel distension. Contrast is now present in the colon and rectum Electronically Signed   By: Jasmine Pang M.D.   On: 07/25/2022 18:01   DG CHEST PORT 1 VIEW  Result Date: 07/25/2022 CLINICAL DATA:  Fever NG tube EXAM: PORTABLE CHEST 1 VIEW COMPARISON:  04/17/2019 FINDINGS: Esophageal tube tip below the diaphragm but incompletely visualized. Right lung clear. Airspace disease left base. Normal cardiac size IMPRESSION: 1. Esophageal tube tip below the diaphragm but incompletely visualized. 2. Left base airspace disease, atelectasis versus pneumonia Electronically Signed   By: Jasmine Pang M.D.   On: 07/25/2022 17:55   DG Abd Portable 1V-Small Bowel Obstruction Protocol-initial, 8 hr delay  Result Date: 07/25/2022 CLINICAL DATA:  8 hour small-bowel follow-up film EXAM: PORTABLE ABDOMEN - 1 VIEW COMPARISON:  Film  from the previous day. FINDINGS: Gastric catheter is not visualized although may be tapered off the film. Persistent small bowel dilatation is noted with contrast noted within the small bowel. No colonic contrast is noted. 24 hour follow-up film is recommended. IMPRESSION: Persistent small bowel dilatation with contrast material within. 24 hour follow-up film is recommended. Electronically Signed   By: Alcide Clever M.D.   On: 07/25/2022 01:01   DG Abd Portable 1 View  Result Date: 07/24/2022 CLINICAL DATA:  NG tube placement EXAM: PORTABLE ABDOMEN - 1 VIEW limited  for tube placement COMPARISON:  X-ray 05/05/2022.  CT earlier 07/24/2022 FINDINGS: Limited x-ray of the upper abdomen and lower chest demonstrates enteric tube with tip overlying the upper stomach. Side hole near the GE junction. This could be advanced further into the stomach. Once again there are some dilated air-filled loops of small bowel in the midabdomen. Left basilar lung atelectasis. IMPRESSION: Limited x-ray for tube placement has tip overlying the upper stomach with side hole at the GE junction. This could be advanced further into the stomach Electronically Signed   By: Karen Kays M.D.   On: 07/24/2022 13:01   CT ABDOMEN PELVIS W CONTRAST  Result Date: 07/24/2022 CLINICAL DATA:  Lower abdominal pain and vomiting. EXAM: CT ABDOMEN AND PELVIS WITH CONTRAST TECHNIQUE: Multidetector CT imaging of the abdomen and pelvis was performed using the standard protocol following bolus administration of intravenous contrast. RADIATION DOSE REDUCTION: This exam was performed according to the departmental dose-optimization program which includes automated exposure control, adjustment of the mA and/or kV according to patient size and/or use of iterative reconstruction technique. CONTRAST:  80mL OMNIPAQUE IOHEXOL 300 MG/ML  SOLN COMPARISON:  X-ray and CT 05/04/2022 and older FINDINGS: Lower chest: There is some linear opacity seen along bases likely scar or atelectasis. No pleural effusion. Hepatobiliary: Diffuse fatty liver infiltration. Gallbladder is nondilated. Patent portal vein. Pancreas: Unremarkable. No pancreatic ductal dilatation or surrounding inflammatory changes. Spleen: Normal in size without focal abnormality. Adrenals/Urinary Tract: Adrenal glands are preserved. Mild bilateral renal atrophy. No collecting system dilatation or enhancing renal mass. The ureters have normal course and caliber extending down to the bladder. Bladder has a normal course and caliber. Stomach/Bowel: No oral contrast. Large  bowel has a normal course and caliber with some scattered stool. Stomach is distended with fluid. The duodenal and proximal and mid jejunum are nondilated. This point the small bowel becomes more fluid-filled and distended. There is areas of small bowel stool appearance in the right lower quadrant, proximal ileum with diameter approaching up to 3.7 cm. There is a transition point to nondilated loop at this location best seen on coronal image 79 of series 7. This a similar location to the finding seen previously. Vascular/Lymphatic: Normal caliber aorta and IVC with some vascular calcifications. No specific abnormal lymph node enlargement seen in the abdomen and pelvis. Reproductive: Brachytherapy changes along the prostate. Other: Trace free fluid in the pelvis.  No free intra-air. Musculoskeletal: Scattered degenerative changes of the spine and pelvis. Trace anterolisthesis of L5 on S1 with pars defects. IMPRESSION: As on the prior CT examination there is mild dilatation of small bowel with a transition point in the right lower quadrant. Again a developing or partial obstruction is possible. No pneumatosis, free air. Only trace free fluid. Fatty liver infiltration. Electronically Signed   By: Karen Kays M.D.   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 Will clamp NG and give clear liquids. If patient develops worsening abdominal pain/bloating, or any n/v then return NG to LIWS. If he tolerates this and continues to have bowel function plan to d/c NG tube later today. If he improves may discuss seeing if we could move up date of elective procedure since Dr. Cliffton Asters is the on call surgeon next week.  I called and updated the patient's daughter, Gaynelle Cage, per request.   AKI - improved,  creatinine 1.1 GERD PMH prostate cancer    FEN - clamp NG, CLD VTE - SCD's, lovenox ID - none indicated from surgical standpoint  I reviewed last 24 h vitals and pain scores, last 48 h intake and output, last 24 h labs and trends, and last 24 h imaging results.    LOS: 2 days    Franne Forts, [facility] Surgery 07/26/2022, 10:08 AM Please see Amion for pager number during day hours 7:00am-4:30pm

## 2022-07-26 NOTE — Plan of Care (Signed)
  Problem: Education: Goal: Knowledge of General Education information will improve Description: Including pain rating scale, medication(s)/side effects and non-pharmacologic comfort measures Outcome: Progressing   Problem: Health Behavior/Discharge Planning: Goal: Ability to manage health-related needs will improve Outcome: Progressing   Problem: Clinical Measurements: Goal: Ability to maintain clinical measurements within normal limits will improve Outcome: Progressing Goal: Will remain free from infection Outcome: Progressing Goal: Diagnostic test results will improve Outcome: Progressing Goal: Respiratory complications will improve Outcome: Progressing Goal: Cardiovascular complication will be avoided Outcome: Progressing   Problem: Activity: Goal: Risk for activity intolerance will decrease Outcome: Progressing   Problem: Nutrition: Goal: Adequate nutrition will be maintained Outcome: Progressing   Problem: Coping: Goal: Level of anxiety will decrease Outcome: Progressing   Problem: Elimination: Goal: Will not experience complications related to bowel motility Outcome: Progressing Goal: Will not experience complications related to urinary retention Outcome: Progressing   Problem: Pain Managment: Goal: General experience of comfort will improve Outcome: Progressing   Problem: Safety: Goal: Ability to remain free from injury will improve Outcome: Progressing   Problem: Skin Integrity: Goal: Risk for impaired skin integrity will decrease Outcome: Progressing  Pt A/Ox4 on RA. NG tube in place to low intermittent suction. Dark brown output. Pt states pain and nausea well controlled overnight, no PRNs administered. Pt states he believes he has begun to pass gas.

## 2022-07-26 NOTE — Progress Notes (Signed)
PROGRESS NOTE    Shawn Walsh  ZOX:[id number] DOB: 07-16-1945 DOA: 07/24/2022 PCP: Daisy Floro, MD   Brief Narrative: 77 year old with past medical history significant for hyperlipidemia, nephrolithiasis, prostate cancer status post pelvic radiation with recurrent SBO, presented to hospital with abdominal discomfort, nausea vomiting consistent with his prior bowel obstruction.  He is follow-up with surgery on a regular basis and there are plans in place for exploratory laparotomy at the end of August.  CT scan confirmed ileus/partial small bowel obstruction.  General surgery was consulted and he was admitted to the hospital for further evaluation.    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 Surgery will allow clear liquid and NG tube clamped  2-AKI: Due to hypovolemia.  Creatinine normalized with fluids  Hyperlipidemia: Holding statins while NPO  History of prostate cancer: holding tamsulosin due to n.p.o.  GERD: PPI  Anemia; likely component hemodilution, anemia acute illness. Monitor hb.  Hb 4 month ago : 11--10  Hypomagnesemia; replete IV>   Estimated body mass index is 22.67 kg/m as calculated from the following:   Height as of this encounter: 5' 7.99" (1.727 m).   Weight as of this encounter: 67.6 kg.   DVT prophylaxis: Lovenox Code Status: Full code Family Communication: Care discussed with patient Disposition Plan:  Status is: Inpatient Remains inpatient appropriate because: SBO    Consultants:  General Surgery   Procedures:  none  Antimicrobials:    Subjective: He is alert, started clear liquid.  Denies abdominal pain.  Passing gas.   Objective: Vitals:   07/25/22 1300 07/25/22 1436 07/25/22 2012 07/26/22 0416  BP: (!) 152/75 139/72 129/63 (!) 141/72  Pulse: 97 93 74 84  Resp: 17 16 17 18    Temp: (!) 101.4 F (38.6 C) (!) 100.4 F (38 C) 97.9 F (36.6 C) 98.7 F (37.1 C)  TempSrc: Oral  Oral Oral  SpO2: 94% 94% 96% 95%  Weight:      Height:        Intake/Output Summary (Last 24 hours) at 07/26/2022 0838 Last data filed at 07/26/2022 0528 Gross per 24 hour  Intake 4661 ml  Output 1150 ml  Net 3511 ml   Filed Weights   07/24/22 1801  Weight: 67.6 kg    Examination:  General exam: Appears calm and comfortable  Respiratory system: Clear to auscultation. Respiratory effort normal. Cardiovascular system: S1 & S2 heard, RRR. No JVD, murmurs, rubs, gallops or clicks. No pedal edema. Gastrointestinal system: Abdomen is nondistended, soft mild distended.  Central nervous system: Alert and oriented. No focal neurological deficits. Extremities: Symmetric 5 x 5 power.  Data Reviewed: I have personally reviewed following labs and imaging studies  CBC: Recent Labs  Lab 07/24/22 0926 07/24/22 0935 07/25/22 0356 07/26/22 0349  WBC 9.2  --  7.4 8.9  NEUTROABS 7.5  --   --   --   HGB 15.0 15.3 12.5* 10.6*  HCT 45.9 45.0 38.9* 33.4*  MCV 93.1  --  94.9 96.5  PLT 218  --  178 144*   Basic Metabolic Panel: Recent Labs  Lab 07/24/22 0926 07/24/22 0935 07/25/22 0356 07/26/22 0349  NA 138 140 140 140  K 4.1 4.3 4.3 3.5  CL 103 102 107 109  CO2 26  --  27 23  GLUCOSE 132* 126* 109* 94  BUN 23 22 20 18   CREATININE  1.26* 1.30* 1.11 1.10  CALCIUM 9.6  --  8.6* 8.1*  MG 2.2  --   --  1.8   GFR: Estimated Creatinine Clearance: 53.8 mL/min (by C-G formula based on SCr of 1.1 mg/dL). Liver Function Tests: Recent Labs  Lab 07/24/22 0926 07/26/22 0349  AST 25 17  ALT 19 14  ALKPHOS 91 62  BILITOT 1.4* 1.8*  PROT 8.1 5.7*  ALBUMIN 4.2 2.9*   Recent Labs  Lab 07/24/22 0926  LIPASE 32   No results for input(s): "AMMONIA" in the last 168 hours. Coagulation Profile: No results for input(s): "INR", "PROTIME" in the last 168 hours. Cardiac Enzymes: No  results for input(s): "CKTOTAL", "CKMB", "CKMBINDEX", "TROPONINI" in the last 168 hours. BNP (last 3 results) No results for input(s): "PROBNP" in the last 8760 hours. HbA1C: No results for input(s): "HGBA1C" in the last 72 hours. CBG: No results for input(s): "GLUCAP" in the last 168 hours. Lipid Profile: No results for input(s): "CHOL", "HDL", "LDLCALC", "TRIG", "CHOLHDL", "LDLDIRECT" in the last 72 hours. Thyroid Function Tests: No results for input(s): "TSH", "T4TOTAL", "FREET4", "T3FREE", "THYROIDAB" in the last 72 hours. Anemia Panel: No results for input(s): "VITAMINB12", "FOLATE", "FERRITIN", "TIBC", "IRON", "RETICCTPCT" in the last 72 hours. Sepsis Labs: No results for input(s): "PROCALCITON", "LATICACIDVEN" in the last 168 hours.  Recent Results (from the past 240 hour(s))  Culture, blood (Routine X 2) w Reflex to ID Panel     Status: None (Preliminary result)   Collection Time: 07/25/22  1:51 PM   Specimen: BLOOD RIGHT HAND  Result Value Ref Range Status   Specimen Description   Final    BLOOD RIGHT HAND Performed at [facility], [address] [place], [place] [address]    Special Requests   Final    BOTTLES DRAWN AEROBIC ONLY Blood Culture adequate volume Performed at [facility], [address], [place], [place] [address]    Culture PENDING  Incomplete   Report Status PENDING  Incomplete  Culture, blood (Routine X 2) w Reflex to ID Panel     Status: None (Preliminary result)   Collection Time: 07/25/22  2:00 PM   Specimen: BLOOD LEFT ARM  Result Value Ref Range Status   Specimen Description   Final    BLOOD LEFT ARM Performed at [facility], [address], [place], [place] [address]    Special Requests   Final    BOTTLES DRAWN AEROBIC ONLY Blood Culture adequate volume Performed at [facility], [address], [place], [place] [address]    Culture PENDING  Incomplete   Report Status PENDING  Incomplete          Radiology Studies: DG Abd Portable 1V  Result Date: 07/25/2022 CLINICAL DATA:  Bowel obstruction NG tube EXAM: PORTABLE ABDOMEN - 1 VIEW COMPARISON:  07/25/2022 FINDINGS: Esophageal tube tip overlies proximal stomach, side-port in the region of GE junction. Enteral contrast within the colon and rectum. Residual central small bowel distension measuring up to 3.3 cm. IMPRESSION: 1. Esophageal tube tip overlies proximal stomach, side-port in the region of GE junction. Consider further advancement by 5-10 cm for more optimal positioning 2. Residual central small bowel distension. Contrast is now present in the colon and rectum Electronically Signed   By: Jasmine Pang M.D.   On: 07/25/2022 18:01   DG CHEST PORT 1 VIEW  Result Date: 07/25/2022 CLINICAL DATA:  Fever NG tube EXAM: PORTABLE CHEST 1 VIEW COMPARISON:  04/17/2019 FINDINGS: Esophageal tube tip below the diaphragm but incompletely visualized. Right lung clear. Airspace disease left base. Normal cardiac size IMPRESSION: 1. Esophageal tube tip below the diaphragm but incompletely visualized. 2. Left base airspace disease, atelectasis versus pneumonia Electronically Signed   By: Jasmine Pang M.D.   On: 07/25/2022 17:55   DG Abd Portable 1V-Small Bowel Obstruction Protocol-initial, 8 hr delay  Result Date: 07/25/2022 CLINICAL DATA:  8 hour small-bowel follow-up film EXAM: PORTABLE ABDOMEN - 1 VIEW COMPARISON:  Film from the previous day. FINDINGS: Gastric catheter is not visualized although may be tapered off the film. Persistent small bowel dilatation is noted with contrast noted within the small bowel. No colonic contrast is noted. 24 hour follow-up film is recommended. IMPRESSION: Persistent small bowel dilatation with contrast material within. 24 hour follow-up film is recommended. Electronically Signed   By: Alcide Clever M.D.   On: 07/25/2022 01:01   DG Abd Portable 1 View  Result Date: 07/24/2022 CLINICAL DATA:  NG tube placement  EXAM: PORTABLE ABDOMEN - 1 VIEW limited for tube placement COMPARISON:  X-ray 05/05/2022.  CT earlier 07/24/2022 FINDINGS: Limited x-ray of the upper abdomen and lower chest demonstrates enteric tube with tip overlying the upper stomach. Side hole near the GE junction. This could be advanced further into the stomach. Once again there are some dilated air-filled loops of small bowel in the midabdomen. Left basilar lung atelectasis. IMPRESSION: Limited x-ray for tube placement has tip overlying the upper stomach with side hole at the GE junction. This could be advanced further into the stomach Electronically Signed   By: Karen Kays M.D.   On: 07/24/2022 13:01   CT ABDOMEN PELVIS W CONTRAST  Result Date: 07/24/2022 CLINICAL DATA:  Lower abdominal pain and vomiting. EXAM: CT ABDOMEN AND PELVIS WITH CONTRAST TECHNIQUE: Multidetector CT imaging of the abdomen and pelvis was performed using the standard protocol following bolus administration of intravenous contrast. RADIATION DOSE REDUCTION: This exam was performed according to the departmental dose-optimization program which includes automated exposure control, adjustment of the mA and/or kV according to patient size and/or use of iterative reconstruction technique. CONTRAST:  80mL OMNIPAQUE IOHEXOL 300 MG/ML  SOLN COMPARISON:  X-ray and CT 05/04/2022 and older FINDINGS: Lower chest: There is some linear opacity seen along bases likely scar or atelectasis. No pleural effusion. Hepatobiliary: Diffuse fatty liver infiltration. Gallbladder is nondilated. Patent portal vein. Pancreas: Unremarkable. No pancreatic ductal dilatation or surrounding inflammatory changes. Spleen: Normal in size without focal abnormality. Adrenals/Urinary Tract: Adrenal glands are preserved. Mild bilateral renal atrophy. No collecting system dilatation or enhancing renal mass. The ureters have normal course and caliber extending down to the bladder. Bladder has a normal course and caliber.  Stomach/Bowel: No oral contrast. Large bowel has a normal course and caliber with some scattered stool. Stomach is distended with fluid. The duodenal and proximal and mid jejunum are nondilated. This point the small bowel becomes more fluid-filled and distended. There is areas of small bowel stool appearance in the right lower quadrant, proximal ileum with diameter approaching up to 3.7 cm. There is a transition point to nondilated loop at this location best seen on coronal image 79 of series 7. This a similar location to the finding seen previously. Vascular/Lymphatic: Normal caliber aorta and IVC with some vascular calcifications. No specific abnormal lymph node enlargement seen in the abdomen and pelvis. Reproductive: Brachytherapy changes along the prostate. Other: Trace free fluid in the pelvis.  No free intra-air. Musculoskeletal: Scattered degenerative  changes of the spine and pelvis. Trace anterolisthesis of L5 on S1 with pars defects. IMPRESSION: As on the prior CT examination there is mild dilatation of small bowel with a transition point in the right lower quadrant. Again a developing or partial obstruction is possible. No pneumatosis, free air. Only trace free fluid. Fatty liver infiltration. Electronically Signed   By: Karen Kays M.D.   On: 07/24/2022 11:07        Scheduled Meds:  enoxaparin (LOVENOX) injection  40 mg Subcutaneous Daily   pantoprazole (PROTONIX) IV  40 mg Intravenous Daily   Continuous Infusions:  sodium chloride 125 mL/hr at 07/26/22 0531     LOS: 2 days    Time spent: 35 minutes    Cortney Mckinney A Finnegan Gatta, MD [facility]   If 7PM-7AM, please contact night-coverage [email or web address]  07/26/2022, 8:38 AM

## 2022-07-27 DIAGNOSIS — K56609 Unspecified intestinal obstruction, unspecified as to partial versus complete obstruction: Secondary | ICD-10-CM | POA: Diagnosis not present

## 2022-07-27 LAB — BASIC METABOLIC PANEL
Anion gap: 8 (ref 5–15)
BUN: 13 mg/dL (ref 8–23)
CO2: 21 mmol/L — ABNORMAL LOW (ref 22–32)
Calcium: 8.1 mg/dL — ABNORMAL LOW (ref 8.9–10.3)
Chloride: 108 mmol/L (ref 98–111)
Creatinine, Ser: 0.96 mg/dL (ref 0.61–1.24)
GFR, Estimated: >60 mL/min (ref >60)
Glucose, Bld: 87 mg/dL (ref 70–99)
Potassium: 3.5 mmol/L (ref 3.5–5.1)
Sodium: 137 mmol/L (ref 135–145)

## 2022-07-27 LAB — CBC
HCT: 31.9 % — ABNORMAL LOW (ref 39.0–52.0)
Hemoglobin: 10.4 g/dL — ABNORMAL LOW (ref 13.0–17.0)
MCH: 30.6 pg (ref 26.0–34.0)
MCHC: 32.6 g/dL (ref 30.0–36.0)
MCV: 93.8 fL (ref 80.0–100.0)
Platelets: 127 10*3/uL — ABNORMAL LOW (ref 150–400)
RBC: 3.4 MIL/uL — ABNORMAL LOW (ref 4.22–5.81)
RDW: 14.4 % (ref 11.5–15.5)
nRBC: 0 % (ref 0.0–0.2)

## 2022-07-27 LAB — CULTURE, BLOOD (ROUTINE X 2)
Culture: NO GROWTH
Special Requests: ADEQUATE

## 2022-07-27 MED ORDER — ENSURE ENLIVE PO LIQD
237.0000 mL | Freq: Two times a day (BID) | ORAL | Status: DC
  Administered 2022-07-27 (×2): 237 mL via ORAL

## 2022-07-27 MED ORDER — PANTOPRAZOLE SODIUM 40 MG PO TBEC
40.0000 mg | DELAYED_RELEASE_TABLET | Freq: Every day | ORAL | Status: DC
  Administered 2022-07-27: 40 mg via ORAL
  Filled 2022-07-27: qty 1

## 2022-07-27 MED ORDER — POLYETHYLENE GLYCOL 3350 17 G PO PACK
17.0000 g | PACK | Freq: Every day | ORAL | Status: DC
  Administered 2022-07-27: 17 g via ORAL
  Filled 2022-07-27: qty 1

## 2022-07-27 MED ORDER — POLYETHYLENE GLYCOL 3350 17 G PO PACK: 17.0000 g | PACK | Freq: Every day | ORAL | 0 refills | Status: AC

## 2022-07-27 NOTE — Plan of Care (Signed)

## 2022-07-27 NOTE — Discharge Summary (Signed)
Physician Discharge Summary   Patient: Shawn Walsh MRN: [id number] DOB: 03/06/1945  Admit date:     07/24/2022  Discharge date: 07/27/22  Discharge Physician: Alba Cory   PCP: Daisy Floro, MD   Recommendations at discharge:    Close follow up with Dr Cliffton Asters for planned Surgery.   Discharge Diagnoses: Principal Problem:   SBO (small bowel obstruction) (HCC)  Resolved Problems:   * No resolved hospital problems. *  Hospital Course: 77 year old with past medical history significant for hyperlipidemia, nephrolithiasis, prostate cancer status post pelvic radiation with recurrent SBO, presented to hospital with abdominal discomfort, nausea vomiting consistent with his prior bowel obstruction.  He is follow-up with surgery on a regular basis and there are plans in place for exploratory laparotomy at the end of August.  CT scan confirmed ileus/partial small bowel obstruction.  General surgery was consulted and he was admitted to the hospital for further evaluation.     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 Tolerating full liquid. Diet advanced to soft today. If he is able to tolerates soft diet plan to discharge today.  He had BM yesterday.  Started on Miralax daily.   2-AKI: Due to hypovolemia.  Creatinine normalized with fluids   Hyperlipidemia: resume statins.    History of prostate cancer: resume flomax   GERD: PPI   Anemia; likely component hemodilution, anemia acute illness. Monitor hb.  Hb 4 month ago : 11--10   Hypomagnesemia; Replaced.           Consultants: Surgery  Procedures performed: None Disposition: Home Diet recommendation:  Discharge Diet Orders (From admission, onward)     Start     Ordered   07/27/22 0000  Diet - low sodium heart healthy        07/27/22 1017           Cardiac diet DISCHARGE MEDICATION: Allergies as of 07/27/2022        Reactions   Amoxicillin Nausea And Vomiting   Amoxicillin-pot Clavulanate Nausea And Vomiting   Moxifloxacin Nausea And Vomiting   Nsaids Other (See Comments)   Elevated kidney "tests"        Medication List     STOP taking these medications    tamsulosin 0.4 MG Caps capsule Commonly known as: FLOMAX   Testosterone 1.62 % Gel       TAKE these medications    b complex vitamins capsule Take 1 capsule by mouth daily.   CENTRUM SILVER PO Take 1 tablet by mouth daily with breakfast.   mometasone 0.1 % cream Commonly known as: ELOCON Apply 1 Application topically daily as needed (for itching or redness- affected areas).   omeprazole 20 MG capsule Commonly known as: PRILOSEC Take 20 mg by mouth daily before breakfast.   polyethylene glycol 17 g packet Commonly known as: MIRALAX / GLYCOLAX Take 17 g by mouth daily. What changed:  when to take this reasons to take this additional instructions   rosuvastatin 5 MG tablet Commonly known as: CRESTOR Take 5 mg by mouth daily.   TYLENOL 500 MG tablet Generic drug: acetaminophen Take 500 mg by mouth every 6 (six) hours as needed for mild pain or headache.   Vitamin D3 25 MCG (1000 UT) Caps Take 1,000 Units by mouth daily.        Follow-up Information     [facility] Follow up.   Specialty: General Surgery Why: Follow  up as scheduled for surgery 08/30/22. We will work on moving your surgery sooner and reach out to you if this is a possibility. Contact information: [address] [place] [address] [phone]        Daisy Floro, MD Follow up in 1 week(s).   Specialty: Family Medicine Contact information: [address] [place] [address] [phone]                Discharge Exam: Ceasar Mons Weights   07/24/22 1801  Weight: 67.6 kg   General; NAD  Condition at discharge: stable  The results of significant diagnostics  from this hospitalization (including imaging, microbiology, ancillary and laboratory) are listed below for reference.   Imaging Studies: DG Abd Portable 1V  Result Date: 07/25/2022 CLINICAL DATA:  Bowel obstruction NG tube EXAM: PORTABLE ABDOMEN - 1 VIEW COMPARISON:  07/25/2022 FINDINGS: Esophageal tube tip overlies proximal stomach, side-port in the region of GE junction. Enteral contrast within the colon and rectum. Residual central small bowel distension measuring up to 3.3 cm. IMPRESSION: 1. Esophageal tube tip overlies proximal stomach, side-port in the region of GE junction. Consider further advancement by 5-10 cm for more optimal positioning 2. Residual central small bowel distension. Contrast is now present in the colon and rectum Electronically Signed   By: Jasmine Pang M.D.   On: 07/25/2022 18:01   DG CHEST PORT 1 VIEW  Result Date: 07/25/2022 CLINICAL DATA:  Fever NG tube EXAM: PORTABLE CHEST 1 VIEW COMPARISON:  04/17/2019 FINDINGS: Esophageal tube tip below the diaphragm but incompletely visualized. Right lung clear. Airspace disease left base. Normal cardiac size IMPRESSION: 1. Esophageal tube tip below the diaphragm but incompletely visualized. 2. Left base airspace disease, atelectasis versus pneumonia Electronically Signed   By: Jasmine Pang M.D.   On: 07/25/2022 17:55   DG Abd Portable 1V-Small Bowel Obstruction Protocol-initial, 8 hr delay  Result Date: 07/25/2022 CLINICAL DATA:  8 hour small-bowel follow-up film EXAM: PORTABLE ABDOMEN - 1 VIEW COMPARISON:  Film from the previous day. FINDINGS: Gastric catheter is not visualized although may be tapered off the film. Persistent small bowel dilatation is noted with contrast noted within the small bowel. No colonic contrast is noted. 24 hour follow-up film is recommended. IMPRESSION: Persistent small bowel dilatation with contrast material within. 24 hour follow-up film is recommended. Electronically Signed   By: Alcide Clever M.D.   On:  07/25/2022 01:01   DG Abd Portable 1 View  Result Date: 07/24/2022 CLINICAL DATA:  NG tube placement EXAM: PORTABLE ABDOMEN - 1 VIEW limited for tube placement COMPARISON:  X-ray 05/05/2022.  CT earlier 07/24/2022 FINDINGS: Limited x-ray of the upper abdomen and lower chest demonstrates enteric tube with tip overlying the upper stomach. Side hole near the GE junction. This could be advanced further into the stomach. Once again there are some dilated air-filled loops of small bowel in the midabdomen. Left basilar lung atelectasis. IMPRESSION: Limited x-ray for tube placement has tip overlying the upper stomach with side hole at the GE junction. This could be advanced further into the stomach Electronically Signed   By: Karen Kays M.D.   On: 07/24/2022 13:01   CT ABDOMEN PELVIS W CONTRAST  Result Date: 07/24/2022 CLINICAL DATA:  Lower abdominal pain and vomiting. EXAM: CT ABDOMEN AND PELVIS WITH CONTRAST TECHNIQUE: Multidetector CT imaging of the abdomen and pelvis was performed using the standard protocol following bolus administration of intravenous contrast. RADIATION DOSE REDUCTION: This exam was  performed according to the departmental dose-optimization program which includes automated exposure control, adjustment of the mA and/or kV according to patient size and/or use of iterative reconstruction technique. CONTRAST:  80mL OMNIPAQUE IOHEXOL 300 MG/ML  SOLN COMPARISON:  X-ray and CT 05/04/2022 and older FINDINGS: Lower chest: There is some linear opacity seen along bases likely scar or atelectasis. No pleural effusion. Hepatobiliary: Diffuse fatty liver infiltration. Gallbladder is nondilated. Patent portal vein. Pancreas: Unremarkable. No pancreatic ductal dilatation or surrounding inflammatory changes. Spleen: Normal in size without focal abnormality. Adrenals/Urinary Tract: Adrenal glands are preserved. Mild bilateral renal atrophy. No collecting system dilatation or enhancing renal mass. The ureters  have normal course and caliber extending down to the bladder. Bladder has a normal course and caliber. Stomach/Bowel: No oral contrast. Large bowel has a normal course and caliber with some scattered stool. Stomach is distended with fluid. The duodenal and proximal and mid jejunum are nondilated. This point the small bowel becomes more fluid-filled and distended. There is areas of small bowel stool appearance in the right lower quadrant, proximal ileum with diameter approaching up to 3.7 cm. There is a transition point to nondilated loop at this location best seen on coronal image 79 of series 7. This a similar location to the finding seen previously. Vascular/Lymphatic: Normal caliber aorta and IVC with some vascular calcifications. No specific abnormal lymph node enlargement seen in the abdomen and pelvis. Reproductive: Brachytherapy changes along the prostate. Other: Trace free fluid in the pelvis.  No free intra-air. Musculoskeletal: Scattered degenerative changes of the spine and pelvis. Trace anterolisthesis of L5 on S1 with pars defects. IMPRESSION: As on the prior CT examination there is mild dilatation of small bowel with a transition point in the right lower quadrant. Again a developing or partial obstruction is possible. No pneumatosis, free air. Only trace free fluid. Fatty liver infiltration. Electronically Signed   By: Karen Kays M.D.   On: 07/24/2022 11:07    Microbiology: Results for orders placed or performed during the hospital encounter of 07/24/22  Culture, blood (Routine X 2) w Reflex to ID Panel     Status: None (Preliminary result)   Collection Time: 07/25/22  1:51 PM   Specimen: BLOOD RIGHT HAND  Result Value Ref Range Status   Specimen Description   Final    BLOOD RIGHT HAND Performed at [facility], [address], [place], [place] [address]    Special Requests   Final    BOTTLES DRAWN AEROBIC ONLY Blood Culture adequate volume Performed at [facility], [address] [place], [place] [address]    Culture   Final    NO GROWTH 2 DAYS Performed at [facility], [address], [place], [place] [address]    Report Status PENDING  Incomplete  Culture, blood (Routine X 2) w Reflex to ID Panel     Status: None (Preliminary result)   Collection Time: 07/25/22  2:00 PM   Specimen: BLOOD LEFT ARM  Result Value Ref Range Status   Specimen Description   Final    BLOOD LEFT ARM Performed at [facility], [address], [place], [place] [address]    Special Requests   Final    BOTTLES DRAWN AEROBIC ONLY Blood Culture adequate volume Performed at [facility], [address] [place], [place] [address]    Culture   Final    NO GROWTH 2 DAYS Performed at [facility], [address] [place], [place] [address]    Report Status PENDING  Incomplete    Labs: CBC: Recent Labs  Lab 07/24/22 0926 07/24/22 0935 07/25/22 0356 07/26/22 0349 07/27/22 0338  WBC 9.2  --  7.4 8.9 4.9  NEUTROABS 7.5  --   --   --   --   HGB 15.0 15.3 12.5* 10.6* 10.4*  HCT 45.9 45.0 38.9* 33.4* 31.9*  MCV 93.1  --  94.9 96.5 93.8  PLT 218  --  178 144* 127*   Basic Metabolic Panel: Recent Labs  Lab 07/24/22 0926 07/24/22 0935 07/25/22 0356 07/26/22 0349 07/27/22 0338  NA 138 140 140 140 137  K 4.1 4.3 4.3 3.5 3.5  CL 103 102 107 109 108  CO2 26  --  27 23 21*  GLUCOSE 132* 126* 109* 94 87  BUN 23 22 20 18 13   CREATININE 1.26* 1.30* 1.11 1.10 0.96  CALCIUM 9.6  --  8.6* 8.1* 8.1*  MG 2.2  --   --  1.8  --    Liver Function Tests: Recent Labs  Lab 07/24/22 0926 07/26/22 0349  AST 25 17  ALT 19 14  ALKPHOS 91 62  BILITOT 1.4* 1.8*  PROT 8.1 5.7*  ALBUMIN 4.2 2.9*   CBG: No results for input(s): "GLUCAP" in the last 168 hours.  Discharge time spent: greater than 30 minutes.  Signed: Alba Cory, MD [facility] 07/27/2022

## 2022-07-27 NOTE — Progress Notes (Signed)
Patient ID: Shawn Walsh, male   DOB: 11-Feb-1945, 77 y.o.   MRN: [id number] [facility] Surgery Progress Note     Subjective: CC-  No abdominal complaints. Denies bloating, nausea, vomiting. Passing flatus. BM yesterday. Not very hungry but tolerating full liquids.  Objective: Vital signs in last 24 hours: Temp:  [98.8 F (37.1 C)-99.7 F (37.6 C)] 98.8 F (37.1 C) (07/25 0623) Pulse Rate:  [69-72] 69 (07/25 0623) Resp:  [14-16] 16 (07/25 0623) BP: (121-138)/(67-79) 138/79 (07/25 0623) SpO2:  [94 %-98 %] 94 % (07/25 0623) Last BM Date : 07/27/22  Intake/Output from previous day: 07/24 0701 - 07/25 0700 In: 1384.9 [P.O.:480; I.V.:899.2; IV Piggyback:5.7] Out: 150 [Urine:150] Intake/Output this shift: No intake/output data recorded.   PE: Gen:  Alert, NAD, pleasant Abd: soft, minimal distension, nontender Psych: A&Ox4  Skin: no rashes noted, warm and dry   Lab Results:  Recent Labs    07/26/22 0349 07/27/22 0338  WBC 8.9 4.9  HGB 10.6* 10.4*  HCT 33.4* 31.9*  PLT 144* 127*   BMET Recent Labs    07/26/22 0349 07/27/22 0338  NA 140 137  K 3.5 3.5  CL 109 108  CO2 23 21*  GLUCOSE 94 87  BUN 18 13  CREATININE 1.10 0.96  CALCIUM 8.1* 8.1*   PT/INR No results for input(s): "LABPROT", "INR" in the last 72 hours. CMP     Component Value Date/Time   NA 137 07/27/2022 0338   K 3.5 07/27/2022 0338   CL 108 07/27/2022 0338   CO2 21 (L) 07/27/2022 0338   GLUCOSE 87 07/27/2022 0338   BUN 13 07/27/2022 0338   CREATININE 0.96 07/27/2022 0338   CALCIUM 8.1 (L) 07/27/2022 0338   PROT 5.7 (L) 07/26/2022 0349   ALBUMIN 2.9 (L) 07/26/2022 0349   AST 17 07/26/2022 0349   ALT 14 07/26/2022 0349   ALKPHOS 62 07/26/2022 0349   BILITOT 1.8 (H) 07/26/2022 0349   GFRNONAA >60 07/27/2022 0338   GFRAA >60 05/09/2019 1110   Lipase     Component Value Date/Time   LIPASE 32 07/24/2022 0926       Studies/Results: DG Abd Portable 1V  Result Date:  07/25/2022 CLINICAL DATA:  Bowel obstruction NG tube EXAM: PORTABLE ABDOMEN - 1 VIEW COMPARISON:  07/25/2022 FINDINGS: Esophageal tube tip overlies proximal stomach, side-port in the region of GE junction. Enteral contrast within the colon and rectum. Residual central small bowel distension measuring up to 3.3 cm. IMPRESSION: 1. Esophageal tube tip overlies proximal stomach, side-port in the region of GE junction. Consider further advancement by 5-10 cm for more optimal positioning 2. Residual central small bowel distension. Contrast is now present in the colon and rectum Electronically Signed   By: Jasmine Pang M.D.   On: 07/25/2022 18:01   DG CHEST PORT 1 VIEW  Result Date: 07/25/2022 CLINICAL DATA:  Fever NG tube EXAM: PORTABLE CHEST 1 VIEW COMPARISON:  04/17/2019 FINDINGS: Esophageal tube tip below the diaphragm but incompletely visualized. Right lung clear. Airspace disease left base. Normal cardiac size IMPRESSION: 1. Esophageal tube tip below the diaphragm but incompletely visualized. 2. Left base airspace disease, atelectasis versus pneumonia Electronically Signed   By: Jasmine Pang M.D.   On: 07/25/2022 17:55    Anti-infectives: Anti-infectives (From admission, onward)    None        Assessment/Plan pSBO, possibly due to a radiation induced stricture of the terminal ileum - Clinically improving and follow up film showed contrast in colon.  Having bowel function and tolerating fulls. Advance to soft diet. Ok for discharge later today if he tolerates this. Will reach out to Dr. Cliffton Asters to discuss seeing if we could move up date of elective procedure since Dr. Cliffton Asters is the on call surgeon next week.    AKI - improved, creatinine 0.96 GERD PMH prostate cancer    FEN - soft diet, Boost VTE - SCD's, lovenox ID - none indicated from surgical standpoint  I reviewed hospitalist notes, last 24 h vitals and pain scores, last 48 h intake and output, and last 24 h labs and trends.    LOS: 3  days    Franne Forts, [facility] 07/27/2022, 1:24 PM Please see Amion for pager number during day hours 7:00am-4:30pm

## 2022-07-27 NOTE — Plan of Care (Signed)

## 2022-08-11 NOTE — Progress Notes (Signed)
Sent message, via epic in basket, requesting orders in epic from surgeon.  

## 2022-08-14 ENCOUNTER — Ambulatory Visit: Payer: Self-pay | Admitting: Surgery

## 2022-08-14 DIAGNOSIS — Z01818 Encounter for other preprocedural examination: Secondary | ICD-10-CM

## 2022-08-14 NOTE — Progress Notes (Addendum)
PCP - Daisy Floro, MD Cardiologist - no  PPM/ICD -  Device Orders -  Rep Notified -   Chest x-ray -  EKG - 07-24-22 epic Stress Test -  ECHO -  Cardiac Cath -  LABS-CBC,BMP 07-27-22 epic  Sleep Study -  CPAP -   Fasting Blood Sugar -  Checks Blood Sugar _____ times a day  Blood Thinner Instructions: Aspirin Instructions:  ERAS Protcol - PRE-SURGERY Ensure    COVID vaccine -yes  Activity--Able to climb a flight of stairs without CP or SOB Anesthesia review:   Patient denies shortness of breath, fever, cough and chest pain at PAT appointment   All instructions explained to the patient, with a verbal understanding of the material. Patient agrees to go over the instructions while at home for a better understanding. Patient also instructed to self quarantine after being tested for COVID-19. The opportunity to ask questions was provided.

## 2022-08-14 NOTE — Patient Instructions (Addendum)
SURGICAL WAITING ROOM VISITATION  Patients having surgery or a procedure may have no more than 2 support people in the waiting area - these visitors may rotate.    Children under the age of 34 must have an adult with them who is not the patient.  Due to an increase in RSV and influenza rates and associated hospitalizations, children ages 45 and under may not visit patients in [facility] hospitals.  If the patient needs to stay at the hospital during part of their recovery, the visitor guidelines for inpatient rooms apply. Pre-op nurse will coordinate an appropriate time for 1 support person to accompany patient in pre-op.  This support person may not rotate.    Please refer to the [facility] website for the visitor guidelines for Inpatients (after your surgery is over and you are in a regular room).       Your procedure is scheduled on: 08-30-22   Report to [facility] Main Entrance    Report to admitting at       0615  AM   Call this number if you have problems the morning of surgery [phone]   Do not eat food :After Midnight.   After Midnight you may have the following liquids until _0530_____ AM DAY OF SURGERY  then nothing by mouth  Water Non-Citrus Juices (without pulp, NO RED-Apple, White grape, White cranberry) Black Coffee (NO MILK/CREAM OR CREAMERS, sugar ok)  Clear Tea (NO MILK/CREAM OR CREAMERS, sugar ok) regular and decaf                             Plain Jell-O (NO RED)                                           Fruit ices (not with fruit pulp, NO RED)                                     Popsicles (NO RED)                                                               Sports drinks like Gatorade (NO RED)              Drink 2 Ensure/G2 drinks AT 10:00 PM the night before surgery.        The day of surgery:  Drink ONE (1) Pre-Surgery Clear Ensure  at  0515  AM the morning of surgery. Drink in one sitting. Do not sip.  This drink was given to you during  your hospital  pre-op appointment visit. Nothing else to drink after completing the  Pre-Surgery Clear Ensure by 1045 am then nothing by mouth.          If you have questions, please contact your surgeon's office.   FOLLOW BOWEL PREP AND ANY ADDITIONAL PRE OP INSTRUCTIONS YOU RECEIVED FROM YOUR SURGEON'S OFFICE!!!     Oral Hygiene is also important to reduce your risk of infection.  Remember - BRUSH YOUR TEETH THE MORNING OF SURGERY WITH YOUR REGULAR TOOTHPASTE  DENTURES WILL BE REMOVED PRIOR TO SURGERY PLEASE DO NOT APPLY "Poly grip" OR ADHESIVES!!!   Do NOT smoke after Midnight   Stop all vitamins and herbal supplements 7 days before surgery.   Take these medicines the morning of surgery with A SIP OF WATER: Rosuvastatin, omeprazole  DO NOT TAKE ANY ORAL DIABETIC MEDICATIONS DAY OF YOUR SURGERY  Bring CPAP mask and tubing day of surgery.                              You may not have any metal on your body including hair pins, jewelry, and body piercing             Do not wear lotions, powders, perfumes/cologne, or deodorant               Men may shave face and neck.   Do not bring valuables to the hospital. [facility] IS NOT             RESPONSIBLE   FOR VALUABLES.   Contacts, glasses, dentures or bridgework may not be worn into surgery.   Bring small overnight bag day of surgery.   DO NOT BRING YOUR HOME MEDICATIONS TO THE HOSPITAL. PHARMACY WILL DISPENSE MEDICATIONS LISTED ON YOUR MEDICATION LIST TO YOU DURING YOUR ADMISSION IN THE HOSPITAL!    Patients discharged on the day of surgery will not be allowed to drive home.  Someone NEEDS to stay with you for the first 24 hours after anesthesia.   Special Instructions: Bring a copy of your healthcare power of attorney and living will documents the day of surgery if you haven't scanned them before.              Please read over the following fact sheets you were given: IF YOU HAVE  QUESTIONS ABOUT YOUR PRE-OP INSTRUCTIONS PLEASE CALL [phone]   If you test positive for Covid or have been in contact with anyone that has tested positive in the last 10 days please notify you surgeon.    [facility] - Preparing for Surgery Before surgery, you can play an important role.  Because skin is not sterile, your skin needs to be as free of germs as possible.  You can reduce the number of germs on your skin by washing with CHG (chlorahexidine gluconate) soap before surgery.  CHG is an antiseptic cleaner which kills germs and bonds with the skin to continue killing germs even after washing. Please DO NOT use if you have an allergy to CHG or antibacterial soaps.  If your skin becomes reddened/irritated stop using the CHG and inform your nurse when you arrive at Short Stay. Do not shave (including legs and underarms) for at least 48 hours prior to the first CHG shower.  You may shave your face/neck. Please follow these instructions carefully:  1.  Shower with CHG Soap the night before surgery and the  morning of Surgery.  2.  If you choose to wash your hair, wash your hair first as usual with your  normal  shampoo.  3.  After you shampoo, rinse your hair and body thoroughly to remove the  shampoo.                           4.  Use CHG as you would any other  liquid soap.  You can apply chg directly  to the skin and wash                       Gently with a scrungie or clean washcloth.  5.  Apply the CHG Soap to your body ONLY FROM THE NECK DOWN.   Do not use on face/ open                           Wound or open sores. Avoid contact with eyes, ears mouth and genitals (private parts).                       Wash face,  Genitals (private parts) with your normal soap.             6.  Wash thoroughly, paying special attention to the area where your surgery  will be performed.  7.  Thoroughly rinse your body with warm water from the neck down.  8.  DO NOT shower/wash with your normal soap after  using and rinsing off  the CHG Soap.                9.  Pat yourself dry with a clean towel.            10.  Wear clean pajamas.            11.  Place clean sheets on your bed the night of your first shower and do not  sleep with pets. Day of Surgery : Do not apply any lotions/deodorants the morning of surgery.  Please wear clean clothes to the hospital/surgery center.  FAILURE TO FOLLOW THESE INSTRUCTIONS MAY RESULT IN THE CANCELLATION OF YOUR SURGERY PATIENT SIGNATURE_________________________________  NURSE SIGNATURE__________________________________  ________________________________________________________________________  Shawn Walsh  An incentive spirometer is a tool that can help keep your lungs clear and active. This tool measures how well you are filling your lungs with each breath. Taking long deep breaths may help reverse or decrease the chance of developing breathing (pulmonary) problems (especially infection) following: A long period of time when you are unable to move or be active. BEFORE THE PROCEDURE  If the spirometer includes an indicator to show your best effort, your nurse or respiratory therapist will set it to a desired goal. If possible, sit up straight or lean slightly forward. Try not to slouch. Hold the incentive spirometer in an upright position. INSTRUCTIONS FOR USE  Sit on the edge of your bed if possible, or sit up as far as you can in bed or on a chair. Hold the incentive spirometer in an upright position. Breathe out normally. Place the mouthpiece in your mouth and seal your lips tightly around it. Breathe in slowly and as deeply as possible, raising the piston or the ball toward the top of the column. Hold your breath for 3-5 seconds or for as long as possible. Allow the piston or ball to fall to the bottom of the column. Remove the mouthpiece from your mouth and breathe out normally. Rest for a few seconds and repeat Steps 1 through 7 at least  10 times every 1-2 hours when you are awake. Take your time and take a few normal breaths between deep breaths. The spirometer may include an indicator to show your best effort. Use the indicator as a goal to work toward during each repetition. After each set of 10 deep breaths,  practice coughing to be sure your lungs are clear. If you have an incision (the cut made at the time of surgery), support your incision when coughing by placing a pillow or rolled up towels firmly against it. Once you are able to get out of bed, walk around indoors and cough well. You may stop using the incentive spirometer when instructed by your caregiver.  RISKS AND COMPLICATIONS Take your time so you do not get dizzy or light-headed. If you are in pain, you may need to take or ask for pain medication before doing incentive spirometry. It is harder to take a deep breath if you are having pain. AFTER USE Rest and breathe slowly and easily. It can be helpful to keep track of a log of your progress. Your caregiver can provide you with a simple table to help with this. If you are using the spirometer at home, follow these instructions: SEEK MEDICAL CARE IF:  You are having difficultly using the spirometer. You have trouble using the spirometer as often as instructed. Your pain medication is not giving enough relief while using the spirometer. You develop fever of 100.5 F (38.1 C) or higher. SEEK IMMEDIATE MEDICAL CARE IF:  You cough up bloody sputum that had not been present before. You develop fever of 102 F (38.9 C) or greater. You develop worsening pain at or near the incision site. MAKE SURE YOU:  Understand these instructions. Will watch your condition. Will get help right away if you are not doing well or get worse. Document Released: 05/01/2006 Document Revised: 03/13/2011 Document Reviewed: 07/02/2006 [facility] Patient Information 2014 [place],  [place].   ________________________________________________________________________

## 2022-08-18 ENCOUNTER — Other Ambulatory Visit: Payer: Self-pay

## 2022-08-18 ENCOUNTER — Encounter (HOSPITAL_COMMUNITY)
Admission: RE | Admit: 2022-08-18 | Discharge: 2022-08-18 | Disposition: A | Discharge status: Home / Self Care | Payer: Medicare Other | Source: Ambulatory Visit | Attending: Surgery | Admitting: Surgery

## 2022-08-18 ENCOUNTER — Encounter (HOSPITAL_COMMUNITY): Payer: Self-pay

## 2022-08-18 DIAGNOSIS — Z01818 Encounter for other preprocedural examination: Secondary | ICD-10-CM

## 2022-08-18 DIAGNOSIS — Z01812 Encounter for preprocedural laboratory examination: Secondary | ICD-10-CM | POA: Diagnosis not present

## 2022-08-18 LAB — CBC WITH DIFFERENTIAL/PLATELET
Abs Immature Granulocytes: 0.01 10*3/uL (ref 0.00–0.07)
Basophils Absolute: 0 10*3/uL (ref 0.0–0.1)
Basophils Relative: 1 %
Eosinophils Absolute: 0.3 10*3/uL (ref 0.0–0.5)
Eosinophils Relative: 7 %
HCT: 39.8 % (ref 39.0–52.0)
Hemoglobin: 13 g/dL (ref 13.0–17.0)
Immature Granulocytes: 0 %
Lymphocytes Relative: 47 %
Lymphs Abs: 2.1 10*3/uL (ref 0.7–4.0)
MCH: 31 pg (ref 26.0–34.0)
MCHC: 32.7 g/dL (ref 30.0–36.0)
MCV: 94.8 fL (ref 80.0–100.0)
Monocytes Absolute: 0.4 10*3/uL (ref 0.1–1.0)
Monocytes Relative: 9 %
Neutro Abs: 1.6 10*3/uL — ABNORMAL LOW (ref 1.7–7.7)
Neutrophils Relative %: 36 %
Platelets: 218 10*3/uL (ref 150–400)
RBC: 4.2 MIL/uL — ABNORMAL LOW (ref 4.22–5.81)
RDW: 13.8 % (ref 11.5–15.5)
WBC: 4.3 10*3/uL (ref 4.0–10.5)
nRBC: 0 % (ref 0.0–0.2)

## 2022-08-18 LAB — TYPE AND SCREEN
ABO/RH(D): A POS
Antibody Screen: NEGATIVE

## 2022-08-18 LAB — COMPREHENSIVE METABOLIC PANEL
ALT: 17 U/L (ref 0–44)
AST: 21 U/L (ref 15–41)
Albumin: 3.7 g/dL (ref 3.5–5.0)
Alkaline Phosphatase: 77 U/L (ref 38–126)
Anion gap: 8 (ref 5–15)
BUN: 18 mg/dL (ref 8–23)
CO2: 23 mmol/L (ref 22–32)
Calcium: 9.1 mg/dL (ref 8.9–10.3)
Chloride: 107 mmol/L (ref 98–111)
Creatinine, Ser: 1.19 mg/dL (ref 0.61–1.24)
GFR, Estimated: >60 mL/min (ref >60)
Glucose, Bld: 89 mg/dL (ref 70–99)
Potassium: 4.2 mmol/L (ref 3.5–5.1)
Sodium: 138 mmol/L (ref 135–145)
Total Bilirubin: 1.1 mg/dL (ref 0.3–1.2)
Total Protein: 6.9 g/dL (ref 6.5–8.1)

## 2022-08-30 ENCOUNTER — Encounter (HOSPITAL_COMMUNITY): Payer: Self-pay | Admitting: Surgery

## 2022-08-30 ENCOUNTER — Inpatient Hospital Stay (HOSPITAL_COMMUNITY)
Admission: RE | Admit: 2022-08-30 | Discharge: 2022-09-01 | DRG: 331 | Disposition: A | Discharge status: Home / Self Care | Payer: Medicare Other | Source: Ambulatory Visit | Attending: Surgery | Admitting: Surgery

## 2022-08-30 ENCOUNTER — Encounter (HOSPITAL_COMMUNITY)
Admission: RE | Disposition: A | Discharge status: Home / Self Care | Payer: Self-pay | Source: Ambulatory Visit | Attending: Surgery

## 2022-08-30 ENCOUNTER — Other Ambulatory Visit: Payer: Self-pay

## 2022-08-30 ENCOUNTER — Inpatient Hospital Stay (HOSPITAL_COMMUNITY): Payer: Medicare Other

## 2022-08-30 ENCOUNTER — Inpatient Hospital Stay (HOSPITAL_COMMUNITY): Payer: Self-pay

## 2022-08-30 DIAGNOSIS — K660 Peritoneal adhesions (postprocedural) (postinfection): Secondary | ICD-10-CM | POA: Diagnosis not present

## 2022-08-30 DIAGNOSIS — K5651 Intestinal adhesions [bands], with partial obstruction: Principal | ICD-10-CM | POA: Diagnosis present

## 2022-08-30 DIAGNOSIS — K565 Intestinal adhesions [bands], unspecified as to partial versus complete obstruction: Secondary | ICD-10-CM

## 2022-08-30 DIAGNOSIS — Z888 Allergy status to other drugs, medicaments and biological substances status: Secondary | ICD-10-CM

## 2022-08-30 DIAGNOSIS — Z87442 Personal history of urinary calculi: Secondary | ICD-10-CM | POA: Diagnosis not present

## 2022-08-30 DIAGNOSIS — Z886 Allergy status to analgesic agent status: Secondary | ICD-10-CM | POA: Diagnosis not present

## 2022-08-30 DIAGNOSIS — Z8546 Personal history of malignant neoplasm of prostate: Secondary | ICD-10-CM

## 2022-08-30 DIAGNOSIS — Z880 Allergy status to penicillin: Secondary | ICD-10-CM

## 2022-08-30 DIAGNOSIS — E785 Hyperlipidemia, unspecified: Secondary | ICD-10-CM | POA: Diagnosis present

## 2022-08-30 DIAGNOSIS — Z8616 Personal history of COVID-19: Secondary | ICD-10-CM | POA: Diagnosis not present

## 2022-08-30 DIAGNOSIS — Z9049 Acquired absence of other specified parts of digestive tract: Principal | ICD-10-CM

## 2022-08-30 DIAGNOSIS — Z83438 Family history of other disorder of lipoprotein metabolism and other lipidemia: Secondary | ICD-10-CM

## 2022-08-30 DIAGNOSIS — Z8249 Family history of ischemic heart disease and other diseases of the circulatory system: Secondary | ICD-10-CM

## 2022-08-30 HISTORY — PX: LYSIS OF ADHESION: SHX5961

## 2022-08-30 HISTORY — PX: LAPAROSCOPY: SHX197

## 2022-08-30 HISTORY — PX: BOWEL RESECTION: SHX1257

## 2022-08-30 LAB — ABO/RH: ABO/RH(D): A POS

## 2022-08-30 SURGERY — LAPAROSCOPY, DIAGNOSTIC
Anesthesia: General

## 2022-08-30 MED ORDER — FENTANYL CITRATE PF 50 MCG/ML IJ SOSY
PREFILLED_SYRINGE | INTRAMUSCULAR | Status: AC
  Administered 2022-08-30: 50 ug via INTRAVENOUS
  Filled 2022-08-30: qty 2

## 2022-08-30 MED ORDER — FENTANYL CITRATE (PF) 100 MCG/2ML IJ SOLN
INTRAMUSCULAR | Status: DC | PRN
  Administered 2022-08-30 (×2): 50 ug via INTRAVENOUS
  Administered 2022-08-30: 100 ug via INTRAVENOUS

## 2022-08-30 MED ORDER — 0.9 % SODIUM CHLORIDE (POUR BTL) OPTIME
TOPICAL | Status: DC | PRN
  Administered 2022-08-30: 1000 mL

## 2022-08-30 MED ORDER — ACETAMINOPHEN 500 MG PO TABS
1000.0000 mg | ORAL_TABLET | ORAL | Status: AC
  Administered 2022-08-30: 1000 mg via ORAL
  Filled 2022-08-30: qty 2

## 2022-08-30 MED ORDER — AMISULPRIDE (ANTIEMETIC) 5 MG/2ML IV SOLN
10.0000 mg | Freq: Once | INTRAVENOUS | Status: AC | PRN
  Administered 2022-08-30: 10 mg via INTRAVENOUS

## 2022-08-30 MED ORDER — FENTANYL CITRATE (PF) 100 MCG/2ML IJ SOLN
INTRAMUSCULAR | Status: AC
  Filled 2022-08-30: qty 2

## 2022-08-30 MED ORDER — ENSURE PRE-SURGERY PO LIQD: 592.0000 mL | Freq: Once | ORAL | Status: DC

## 2022-08-30 MED ORDER — LIDOCAINE HCL (PF) 2 % IJ SOLN
INTRAMUSCULAR | Status: DC | PRN
  Administered 2022-08-30: 1.5 mg/kg/h via INTRADERMAL

## 2022-08-30 MED ORDER — PHENYLEPHRINE 80 MCG/ML (10ML) SYRINGE FOR IV PUSH (FOR BLOOD PRESSURE SUPPORT)
PREFILLED_SYRINGE | INTRAVENOUS | Status: DC | PRN
  Administered 2022-08-30 (×2): 80 ug via INTRAVENOUS

## 2022-08-30 MED ORDER — HEPARIN SODIUM (PORCINE) 5000 UNIT/ML IJ SOLN
5000.0000 [IU] | Freq: Three times a day (TID) | INTRAMUSCULAR | Status: DC
  Administered 2022-08-30 – 2022-09-01 (×5): 5000 [IU] via SUBCUTANEOUS
  Filled 2022-08-30 (×5): qty 1

## 2022-08-30 MED ORDER — LIDOCAINE HCL (PF) 2 % IJ SOLN
INTRAMUSCULAR | Status: AC
  Filled 2022-08-30: qty 5

## 2022-08-30 MED ORDER — DEXAMETHASONE SODIUM PHOSPHATE 10 MG/ML IJ SOLN
INTRAMUSCULAR | Status: AC
  Filled 2022-08-30: qty 1

## 2022-08-30 MED ORDER — BUPIVACAINE-EPINEPHRINE (PF) 0.25% -1:200000 IJ SOLN
INTRAMUSCULAR | Status: DC | PRN
  Administered 2022-08-30: 40 mL

## 2022-08-30 MED ORDER — ONDANSETRON HCL 4 MG/2ML IJ SOLN
INTRAMUSCULAR | Status: DC | PRN
Start: 2022-08-30 — End: 2022-08-30
  Administered 2022-08-30: 4 mg via INTRAVENOUS

## 2022-08-30 MED ORDER — BUPIVACAINE-EPINEPHRINE 0.25% -1:200000 IJ SOLN: INTRAMUSCULAR | Status: DC | PRN

## 2022-08-30 MED ORDER — ENSURE PRE-SURGERY PO LIQD: 296.0000 mL | Freq: Once | ORAL | Status: DC

## 2022-08-30 MED ORDER — BUPIVACAINE LIPOSOME 1.3 % IJ SUSP: 20.0000 mL | Freq: Once | INTRAMUSCULAR | Status: DC

## 2022-08-30 MED ORDER — SIMETHICONE 80 MG PO CHEW: 40.0000 mg | CHEWABLE_TABLET | Freq: Four times a day (QID) | ORAL | Status: DC | PRN

## 2022-08-30 MED ORDER — HYDROMORPHONE HCL 1 MG/ML IJ SOLN
0.5000 mg | INTRAMUSCULAR | Status: DC | PRN
  Administered 2022-08-30 – 2022-08-31 (×2): 0.5 mg via INTRAVENOUS
  Filled 2022-08-30 (×3): qty 0.5

## 2022-08-30 MED ORDER — ALVIMOPAN 12 MG PO CAPS
12.0000 mg | ORAL_CAPSULE | Freq: Two times a day (BID) | ORAL | Status: DC
  Administered 2022-08-31: 12 mg via ORAL
  Filled 2022-08-30: qty 1

## 2022-08-30 MED ORDER — CHLORHEXIDINE GLUCONATE 0.12 % MT SOLN
15.0000 mL | Freq: Once | OROMUCOSAL | Status: AC
  Administered 2022-08-30: 15 mL via OROMUCOSAL

## 2022-08-30 MED ORDER — SUGAMMADEX SODIUM 200 MG/2ML IV SOLN
INTRAVENOUS | Status: DC | PRN
  Administered 2022-08-30: 200 mg via INTRAVENOUS

## 2022-08-30 MED ORDER — CHLORHEXIDINE GLUCONATE CLOTH 2 % EX PADS: 6.0000 | MEDICATED_PAD | Freq: Once | CUTANEOUS | Status: DC

## 2022-08-30 MED ORDER — PANTOPRAZOLE SODIUM 40 MG PO TBEC
40.0000 mg | DELAYED_RELEASE_TABLET | Freq: Every day | ORAL | Status: DC
  Administered 2022-08-31 – 2022-09-01 (×2): 40 mg via ORAL
  Filled 2022-08-30 (×2): qty 1

## 2022-08-30 MED ORDER — VITAMIN D 25 MCG (1000 UNIT) PO TABS
1000.0000 [IU] | ORAL_TABLET | Freq: Every day | ORAL | Status: DC
  Administered 2022-08-31 – 2022-09-01 (×2): 1000 [IU] via ORAL
  Filled 2022-08-30 (×2): qty 1

## 2022-08-30 MED ORDER — B COMPLEX-C PO TABS
1.0000 | ORAL_TABLET | Freq: Every day | ORAL | Status: DC
  Administered 2022-08-31 – 2022-09-01 (×2): 1 via ORAL
  Filled 2022-08-30 (×2): qty 1

## 2022-08-30 MED ORDER — PROPOFOL 10 MG/ML IV BOLUS
INTRAVENOUS | Status: DC | PRN
  Administered 2022-08-30: 30 mg via INTRAVENOUS
  Administered 2022-08-30: 100 mg via INTRAVENOUS

## 2022-08-30 MED ORDER — ALVIMOPAN 12 MG PO CAPS
12.0000 mg | ORAL_CAPSULE | ORAL | Status: AC
  Administered 2022-08-30: 12 mg via ORAL
  Filled 2022-08-30: qty 1

## 2022-08-30 MED ORDER — TRAMADOL HCL 50 MG PO TABS
50.0000 mg | ORAL_TABLET | Freq: Four times a day (QID) | ORAL | Status: DC | PRN
  Administered 2022-08-30 – 2022-08-31 (×2): 50 mg via ORAL
  Filled 2022-08-30 (×2): qty 1

## 2022-08-30 MED ORDER — ACETAMINOPHEN 500 MG PO TABS
1000.0000 mg | ORAL_TABLET | Freq: Four times a day (QID) | ORAL | Status: DC
  Administered 2022-08-30 – 2022-09-01 (×7): 1000 mg via ORAL
  Filled 2022-08-30 (×8): qty 2

## 2022-08-30 MED ORDER — MIDAZOLAM HCL 2 MG/2ML IJ SOLN
INTRAMUSCULAR | Status: AC
  Filled 2022-08-30: qty 2

## 2022-08-30 MED ORDER — BUPIVACAINE LIPOSOME 1.3 % IJ SUSP
INTRAMUSCULAR | Status: AC
  Filled 2022-08-30: qty 20

## 2022-08-30 MED ORDER — PROPOFOL 10 MG/ML IV BOLUS
INTRAVENOUS | Status: AC
  Filled 2022-08-30: qty 20

## 2022-08-30 MED ORDER — ENSURE SURGERY PO LIQD
237.0000 mL | Freq: Two times a day (BID) | ORAL | Status: DC
  Administered 2022-08-31 – 2022-09-01 (×3): 237 mL via ORAL

## 2022-08-30 MED ORDER — SODIUM CHLORIDE 0.9 % IV SOLN
2.0000 g | INTRAVENOUS | Status: AC
  Administered 2022-08-30: 2 g via INTRAVENOUS
  Filled 2022-08-30: qty 2

## 2022-08-30 MED ORDER — LACTATED RINGERS IV SOLN: INTRAVENOUS | Status: DC | PRN

## 2022-08-30 MED ORDER — ONDANSETRON HCL 4 MG/2ML IJ SOLN
INTRAMUSCULAR | Status: AC
  Filled 2022-08-30: qty 2

## 2022-08-30 MED ORDER — ORAL CARE MOUTH RINSE: 15.0000 mL | Freq: Once | OROMUCOSAL | Status: AC

## 2022-08-30 MED ORDER — ALUM & MAG HYDROXIDE-SIMETH 200-200-20 MG/5ML PO SUSP: 30.0000 mL | Freq: Four times a day (QID) | ORAL | Status: DC | PRN

## 2022-08-30 MED ORDER — FENTANYL CITRATE PF 50 MCG/ML IJ SOSY
25.0000 ug | PREFILLED_SYRINGE | INTRAMUSCULAR | Status: DC | PRN
  Administered 2022-08-30: 50 ug via INTRAVENOUS

## 2022-08-30 MED ORDER — ROCURONIUM BROMIDE 100 MG/10ML IV SOLN
INTRAVENOUS | Status: DC | PRN
  Administered 2022-08-30: 10 mg via INTRAVENOUS
  Administered 2022-08-30: 40 mg via INTRAVENOUS
  Administered 2022-08-30: 10 mg via INTRAVENOUS

## 2022-08-30 MED ORDER — MIDAZOLAM HCL 5 MG/5ML IJ SOLN
INTRAMUSCULAR | Status: DC | PRN
  Administered 2022-08-30: 1 mg via INTRAVENOUS

## 2022-08-30 MED ORDER — LIDOCAINE HCL (CARDIAC) PF 100 MG/5ML IV SOSY
PREFILLED_SYRINGE | INTRAVENOUS | Status: DC | PRN
  Administered 2022-08-30: 60 mg via INTRAVENOUS

## 2022-08-30 MED ORDER — IBUPROFEN 400 MG PO TABS
600.0000 mg | ORAL_TABLET | Freq: Four times a day (QID) | ORAL | Status: DC | PRN
  Administered 2022-08-31: 600 mg via ORAL
  Filled 2022-08-30: qty 1

## 2022-08-30 MED ORDER — HEPARIN SODIUM (PORCINE) 5000 UNIT/ML IJ SOLN
5000.0000 [IU] | Freq: Once | INTRAMUSCULAR | Status: AC
  Administered 2022-08-30: 5000 [IU] via SUBCUTANEOUS
  Filled 2022-08-30: qty 1

## 2022-08-30 MED ORDER — DIPHENHYDRAMINE HCL 50 MG/ML IJ SOLN: 12.5000 mg | Freq: Four times a day (QID) | INTRAMUSCULAR | Status: DC | PRN

## 2022-08-30 MED ORDER — HYDRALAZINE HCL 20 MG/ML IJ SOLN: 10.0000 mg | INTRAMUSCULAR | Status: DC | PRN

## 2022-08-30 MED ORDER — BUPIVACAINE-EPINEPHRINE 0.25% -1:200000 IJ SOLN
INTRAMUSCULAR | Status: AC
  Filled 2022-08-30: qty 1

## 2022-08-30 MED ORDER — DEXAMETHASONE SODIUM PHOSPHATE 10 MG/ML IJ SOLN
INTRAMUSCULAR | Status: DC | PRN
Start: 2022-08-30 — End: 2022-08-30
  Administered 2022-08-30: 10 mg via INTRAVENOUS

## 2022-08-30 MED ORDER — ONDANSETRON HCL 4 MG/2ML IJ SOLN
4.0000 mg | Freq: Four times a day (QID) | INTRAMUSCULAR | Status: DC | PRN
  Administered 2022-08-30: 4 mg via INTRAVENOUS
  Filled 2022-08-30: qty 2

## 2022-08-30 MED ORDER — HYDROMORPHONE HCL 2 MG/ML IJ SOLN
INTRAMUSCULAR | Status: AC
  Filled 2022-08-30: qty 1

## 2022-08-30 MED ORDER — DIPHENHYDRAMINE HCL 12.5 MG/5ML PO ELIX: 12.5000 mg | ORAL_SOLUTION | Freq: Four times a day (QID) | ORAL | Status: DC | PRN

## 2022-08-30 MED ORDER — BUPIVACAINE LIPOSOME 1.3 % IJ SUSP: INTRAMUSCULAR | Status: DC | PRN

## 2022-08-30 MED ORDER — PHENYLEPHRINE HCL-NACL 20-0.9 MG/250ML-% IV SOLN
INTRAVENOUS | Status: DC | PRN
Start: 2022-08-30 — End: 2022-08-30
  Administered 2022-08-30: 50 ug/min via INTRAVENOUS

## 2022-08-30 MED ORDER — LACTATED RINGERS IV SOLN: INTRAVENOUS | Status: DC

## 2022-08-30 MED ORDER — HYDROMORPHONE HCL 1 MG/ML IJ SOLN
INTRAMUSCULAR | Status: DC | PRN
  Administered 2022-08-30 (×2): .2 mg via INTRAVENOUS

## 2022-08-30 MED ORDER — ADULT MULTIVITAMIN W/MINERALS CH
1.0000 | ORAL_TABLET | Freq: Every day | ORAL | Status: DC
  Administered 2022-08-31 – 2022-09-01 (×2): 1 via ORAL
  Filled 2022-08-30 (×2): qty 1

## 2022-08-30 MED ORDER — ROCURONIUM BROMIDE 10 MG/ML (PF) SYRINGE
PREFILLED_SYRINGE | INTRAVENOUS | Status: AC
  Filled 2022-08-30: qty 10

## 2022-08-30 MED ORDER — AMISULPRIDE (ANTIEMETIC) 5 MG/2ML IV SOLN
INTRAVENOUS | Status: AC
  Filled 2022-08-30: qty 4

## 2022-08-30 MED ORDER — LACTATED RINGERS IR SOLN
Status: DC | PRN
  Administered 2022-08-30: 1000 mL

## 2022-08-30 MED ORDER — ROSUVASTATIN CALCIUM 5 MG PO TABS
5.0000 mg | ORAL_TABLET | Freq: Every day | ORAL | Status: DC
  Administered 2022-08-31 – 2022-09-01 (×2): 5 mg via ORAL
  Filled 2022-08-30 (×2): qty 1

## 2022-08-30 MED ORDER — SODIUM CHLORIDE (PF) 0.9 % IJ SOLN
INTRAMUSCULAR | Status: AC
  Filled 2022-08-30: qty 10

## 2022-08-30 MED ORDER — ONDANSETRON HCL 4 MG PO TABS: 4.0000 mg | ORAL_TABLET | Freq: Four times a day (QID) | ORAL | Status: DC | PRN

## 2022-08-30 SURGICAL SUPPLY — 56 items
ADH SKN CLS APL DERMABOND .7 (GAUZE/BANDAGES/DRESSINGS) ×1
APPLIER CLIP 5 13 M/L LIGAMAX5 (MISCELLANEOUS)
APPLIER CLIP ROT 10 11.4 M/L (STAPLE)
APR CLP MED LRG 11.4X10 (STAPLE)
APR CLP MED LRG 5 ANG JAW (MISCELLANEOUS)
BAG COUNTER SPONGE SURGICOUNT (BAG) IMPLANT
BAG SPEC RTRVL 10 TROC 200 (ENDOMECHANICALS)
BAG SPNG CNTER NS LX DISP (BAG) ×1
BLADE EXTENDED COATED 6.5IN (ELECTRODE) IMPLANT
CABLE HIGH FREQUENCY MONO STRZ (ELECTRODE) IMPLANT
CLIP APPLIE 5 13 M/L LIGAMAX5 (MISCELLANEOUS) IMPLANT
CLIP APPLIE ROT 10 11.4 M/L (STAPLE) IMPLANT
COVER MAYO STAND STRL (DRAPES) IMPLANT
DERMABOND ADVANCED .7 DNX12 (GAUZE/BANDAGES/DRESSINGS) IMPLANT
ELECT REM PT RETURN 15FT ADLT (MISCELLANEOUS) ×2 IMPLANT
GAUZE SPONGE 4X4 12PLY STRL (GAUZE/BANDAGES/DRESSINGS) ×2 IMPLANT
GLOVE BIO SURGEON STRL SZ7.5 (GLOVE) ×2 IMPLANT
GLOVE INDICATOR 8.0 STRL GRN (GLOVE) ×4 IMPLANT
GOWN STRL REUS W/ TWL XL LVL3 (GOWN DISPOSABLE) ×4 IMPLANT
GOWN STRL REUS W/TWL XL LVL3 (GOWN DISPOSABLE) ×2
HANDLE SUCTION POOLE (INSTRUMENTS) IMPLANT
IRRIG SUCT STRYKERFLOW 2 WTIP (MISCELLANEOUS) ×1
IRRIGATION SUCT STRKRFLW 2 WTP (MISCELLANEOUS) ×2 IMPLANT
KIT TURNOVER KIT A (KITS) IMPLANT
LIGASURE IMPACT 36 18CM CVD LR (INSTRUMENTS) IMPLANT
POUCH RETRIEVAL ECOSAC 10 (ENDOMECHANICALS) IMPLANT
RELOAD PROXIMATE 75MM BLUE (ENDOMECHANICALS) ×2 IMPLANT
RELOAD STAPLE 75 3.8 BLU REG (ENDOMECHANICALS) IMPLANT
RETRACTOR WND ALEXIS 25 LRG (MISCELLANEOUS) IMPLANT
RTRCTR WOUND ALEXIS 25CM LRG (MISCELLANEOUS) ×1
SEALER TISSUE G2 STRG ARTC 35C (ENDOMECHANICALS) IMPLANT
SLEEVE ADV FIXATION 5X100MM (TROCAR) ×2 IMPLANT
SPIKE FLUID TRANSFER (MISCELLANEOUS) ×2 IMPLANT
STAPLER 90 3.5 STAND SLIM (STAPLE) ×1
STAPLER 90 3.5 STD SLIM (STAPLE) IMPLANT
STAPLER PROXIMATE 75MM BLUE (STAPLE) IMPLANT
SUCTION POOLE HANDLE (INSTRUMENTS) ×1
SUT MNCRL AB 4-0 PS2 18 (SUTURE) IMPLANT
SUT PDS AB 1 CT1 27 (SUTURE) IMPLANT
SUT PDS AB 1 TP1 96 (SUTURE) IMPLANT
SUT SILK 2 0 (SUTURE)
SUT SILK 2 0 SH CR/8 (SUTURE) IMPLANT
SUT SILK 2-0 18XBRD TIE 12 (SUTURE) IMPLANT
SUT SILK 3 0 (SUTURE)
SUT SILK 3 0 SH CR/8 (SUTURE) IMPLANT
SUT SILK 3-0 18XBRD TIE 12 (SUTURE) IMPLANT
SYS BAG RETRIEVAL 10MM (BASKET)
SYSTEM BAG RETRIEVAL 10MM (BASKET) IMPLANT
TOWEL OR 17X26 10 PK STRL BLUE (TOWEL DISPOSABLE) ×2 IMPLANT
TOWEL OR NON WOVEN STRL DISP B (DISPOSABLE) ×2 IMPLANT
TRAY FOLEY MTR SLVR 16FR STAT (SET/KITS/TRAYS/PACK) ×2 IMPLANT
TRAY LAPAROSCOPIC (CUSTOM PROCEDURE TRAY) ×2 IMPLANT
TROCAR ADV FIXATION 12X100MM (TROCAR) IMPLANT
TROCAR ADV FIXATION 5X100MM (TROCAR) ×2 IMPLANT
TROCAR BALLN 12MMX100 BLUNT (TROCAR) IMPLANT
TROCAR Z-THREAD OPTICAL 5X100M (TROCAR) ×2 IMPLANT

## 2022-08-30 NOTE — Anesthesia Preprocedure Evaluation (Signed)
Anesthesia Evaluation  Patient identified by MRN, date of birth, ID band Patient awake    Reviewed: Allergy & Precautions, NPO status , Patient's Chart, lab work & pertinent test results  Airway Mallampati: II  TM Distance: >3 FB Neck ROM: Full    Dental  (+) Dental Advisory Given   Pulmonary neg pulmonary ROS   breath sounds clear to auscultation       Cardiovascular negative cardio ROS  Rhythm:Regular Rate:Normal     Neuro/Psych negative neurological ROS     GI/Hepatic Neg liver ROS,,,Recurrent SBO's with small bowel stricture   Endo/Other  negative endocrine ROS    Renal/GU negative Renal ROS     Musculoskeletal   Abdominal   Peds  Hematology negative hematology ROS (+)   Anesthesia Other Findings   Reproductive/Obstetrics                             Anesthesia Physical Anesthesia Plan  ASA: 2  Anesthesia Plan: General   Post-op Pain Management: Tylenol PO (pre-op)* and Toradol IV (intra-op)*   Induction: Intravenous and Rapid sequence  PONV Risk Score and Plan: 2 and Dexamethasone, Ondansetron and Treatment may vary due to age or medical condition  Airway Management Planned: Oral ETT  Additional Equipment:   Intra-op Plan:   Post-operative Plan: Extubation in OR  Informed Consent: I have reviewed the patients History and Physical, chart, labs and discussed the procedure including the risks, benefits and alternatives for the proposed anesthesia with the patient or authorized representative who has indicated his/her understanding and acceptance.     Dental advisory given  Plan Discussed with: CRNA  Anesthesia Plan Comments:        Anesthesia Quick Evaluation

## 2022-08-30 NOTE — Transfer of Care (Signed)
Immediate Anesthesia Transfer of Care Note  Patient: Shawn Walsh  Procedure(s) Performed: LAPAROSCOPY DIAGNOSTIC, BILATERAL TAP BLOCK SMALL BOWEL RESECTION LYSIS OF ADHESION  Patient Location: PACU  Anesthesia Type:General  Level of Consciousness: drowsy  Airway & Oxygen Therapy: Patient Spontanous Breathing and Patient connected to nasal cannula oxygen  Post-op Assessment: Report given to RN and Post -op Vital signs reviewed and stable  Post vital signs: Reviewed and stable  Last Vitals:  Vitals Value Taken Time  BP 121/69 08/30/22 1117  Temp    Pulse 69 08/30/22 1124  Resp 16 08/30/22 1124  SpO2 100 % 08/30/22 1124  Vitals shown include unfiled device data.  Last Pain:  Vitals:   08/30/22 0715  TempSrc:   PainSc: 0-No pain         Complications: No notable events documented.

## 2022-08-30 NOTE — Anesthesia Procedure Notes (Signed)
Procedure Name: Intubation Date/Time: 08/30/2022 8:35 AM  Performed by: Maurene Capes, CRNAPre-anesthesia Checklist: Patient identified, Emergency Drugs available, Suction available and Patient being monitored Patient Re-evaluated:Patient Re-evaluated prior to induction Oxygen Delivery Method: Circle System Utilized Preoxygenation: Pre-oxygenation with 100% oxygen Induction Type: IV induction Ventilation: Mask ventilation without difficulty Tube type: Oral Tube size: 7.5 mm Number of attempts: 1 Airway Equipment and Method: Rigid stylet Placement Confirmation: ETT inserted through vocal cords under direct vision, positive ETCO2 and breath sounds checked- equal and bilateral Secured at: 23 cm Tube secured with: Tape Dental Injury: Teeth and Oropharynx as per pre-operative assessment

## 2022-08-30 NOTE — Anesthesia Postprocedure Evaluation (Signed)
Anesthesia Post Note  Patient: Shawn Walsh  Procedure(s) Performed: LAPAROSCOPY DIAGNOSTIC, BILATERAL TAP BLOCK SMALL BOWEL RESECTION LYSIS OF ADHESION     Patient location during evaluation: PACU Anesthesia Type: General Level of consciousness: awake and alert Pain management: pain level controlled Vital Signs Assessment: post-procedure vital signs reviewed and stable Respiratory status: spontaneous breathing, nonlabored ventilation, respiratory function stable and patient connected to nasal cannula oxygen Cardiovascular status: blood pressure returned to baseline and stable Postop Assessment: no apparent nausea or vomiting Anesthetic complications: no  No notable events documented.  Last Vitals:  Vitals:   08/30/22 1513 08/30/22 1636  BP: 131/77 (!) 149/81  Pulse: 74 75  Resp: 17 18  Temp: 36.4 C 36.5 C  SpO2: 98% 96%    Last Pain:  Vitals:   08/30/22 1636  TempSrc: Oral  PainSc:                  [name]

## 2022-08-30 NOTE — Op Note (Signed)
08/30/2022  11:06 AM  PATIENT:  Shawn Walsh  77 y.o. male  Patient Care Team: Daisy Floro, MD as PCP - General (Family Medicine) Iva Boop, MD as Consulting Physician (Gastroenterology) Charlott Rakes, MD as Consulting Physician (Gastroenterology)  PRE-OPERATIVE DIAGNOSIS:  Recurrent small bowel obstructions  POST-OPERATIVE DIAGNOSIS:  Same  PROCEDURE:   Laparoscopic-assisted small bowel resection Laparoscopic lysis of adhesions 100 minutes  SURGEON:  Stephanie Coup. Cliffton Asters, MD  ASSISTANT: Karie Soda, MD  ANESTHESIA:   general  COUNTS:  Sponge, needle and instrument counts were reported correct x2 at the conclusion of the operation.  EBL: 100 mL  DRAINS: None  SPECIMEN: Portion of ileum - stitch proximal  COMPLICATIONS: None  FINDINGS: Adhesions throughout his right lower quadrant and upper portions of the right hemipelvis containing small bowel and omentum.  Dense postsurgical appearing type adhesions in the right lower quadrant which likely were subsequently radiated given their tenacious appearance.  There is a wad of small intestine densely adherent to itself in the right lower quadrant with an evident transition point to smooth/normal distal ileum.  Portion of involved ileum within all these adhesions after adhesiolysis was subsequently resected as much of the bulk of adhesions within this wad were not able to be safely dissected without multiple enterotomies.  DISPOSITION: PACU in satisfactory condition  DESCRIPTION: The patient was identified in preop holding and taken to the OR where he was placed on the operating room table. SCDs were placed. General endotracheal anesthesia was induced without difficulty. He was then prepped and draped in the usual sterile fashion. A surgical timeout was performed indicating the correct patient, procedure, positioning and need for preoperative antibiotics.   A new G-tube placed per anesthesia was confirmed to be  dissection.  At Palmer's point, stab incision was created.  The Veress needle was introduced into the peritoneal cavity on the first attempt.  Intraperitoneal location was confirmed with aspiration and saline drop test.  Pneumoperitoneum was established to 15 mmHg using CO2.  At Palmer's point, a 5 mm optical viewing trocar was then inserted under direct visualization into the peritoneal cavity.  The abdomen is surveyed.  There is no evidence of Veress needle or trocar site complications.  There are adhesions noted throughout his right lower quadrant including omentum and small bowel to both the abdominal wall and extending into his pelvis.  Bilateral transversus abdominis plane blocks were then created using a dilute mixture of Exparel with Marcaine.  Under direct visualization, 3 additional 5 mm trocars were then cautiously placed, 1 in the right upper abdomen, 1 just cephalad to the umbilicus, and 1 in the left lower quadrant.  He is positioned in Trendelenburg with right side up.  We approach the adhesions in his right lower quadrant first taking everything down from the abdominal wall.  This was done sharply using EndoShears.  We are able to maintain a safe plane of dissection between the bowel and the peritoneum and avoid any injury to the serosa.  After this, omentum was also taken down.  We then also began to dissect adhesions within multiple small bowel loops.  We then approached adhesions in the pelvis.  Were able to retract the sigmoid laterally and there are no significant occasions to the actual colon but there are some adhesions to the mesocolon.  Small bowel lesions were freed from this.  We were then able to identify where the terminal ileum is.  There is an evident transition point in the mid to  distal ileum where there is an evident wad of small bowel adhesions.  These are all quite dense and fibrotic.  This is likely postsurgical adhesions that were subsequently radiated. Total adhesiolysis  time is 100 minutes.  We confirmed that there were no evident adhesions tethering anything within the pelvis or to the retroperitoneum.  The retroperitoneum was inspected and the gonadal vessels were seen and hemostatic.  There is no evident injury or bleeding.  We then opted to complete this portion of the procedure in an extracorporeal fashion.  The incision at the umbilicus is extended around it and inferior to it.  Subcutaneous tissue was then divided electrocautery.  The peritoneum was carefully entered.  An Alexis wound protector was placed.  The small bowel adhesions were all eviscerated.  We then ran the small bowel from the ligament of Treitz all the way down to the ileocecal valve.  We did this twice.  There were no evident strictures with the exception of the wad of small bowel that was likely radiated.  The distal ileum is healthy and normal in appearance.  There is no creeping fat to suggest any sort of Crohn's disease.  The adhesions within the mid to distal ileum were quite tenacious and densely fibrotic.  Multiple loops of bowel are intimately associated 1 other we cannot safely dissect this without multiple enterotomies.  Therefore we opted to proceed with a small bowel resection.  The windows created in each respective end of the small bowel on either side of this adhesive bundle of bowel.  GIA 75 blue stapler used to divide the bowel on each respective end.  The proximal end is marked with a stitch on the specimen side.  The intervening mesentery was then ligated using a LigaSure device staying close to the bowel to preserve blood flow to the remaining limbs.  The segment of ileum was passed off as specimen.  Attention is directed to creating a small bowel anastomosis.  GIA 75 blue load stapler was then used to create a side-to-side, functional end-to-end ilio ileal anastomosis on the antimesenteric border of each respective limb of bowel.  The staple lines inspected and noted to have  well-formed staples and hemostatic.  The common enterotomy was then closed using a TA 90 blue load stapler including the distal limb of small bowel staple line.  Each corner of the TA staple line was then "dunked" using 2-0 silk suture.  The mesenteric defect in the small bowel mesentery was then closed using a running 0 Vicryl suture.  A 3-0 silk sutures placed at the apex of the small bowel anastomosis.  The anastomosis is palpated and widely patent, 3 fingerbreadths in diameter.  Both limbs of bowel are clearly well-perfused with palpable pulses in the mesentery on each respective side.  This is placed back into the abdomen.  The small bowel was then reinspected.  We ran it from beginning to end and there were no evident serosal injuries or any mal-perfused or injured mesenteric segments.  The abdomen is then irrigated.  Hemostasis is verified.  The laparoscopic ports were all removed and hemostatic.  The Alexis wound protector was removed.  Gloves are changed.  Attention was directed to closing the extraction site fascia.  This was done using 2 running #1 PDS sutures.  The extraction wound was irrigated.  Hemostasis verified.  All sponge, needle, and instrument counts were reported correct.  4-0 Monocryl subcuticular sutures were then used to close the skin of all incision sites.  The abdomen is  washed and dried.  Dermabond was applied to all incisions.  He is awakened from anesthesia, extubated, and transferred to a stretcher for transport to recovery in satisfactory condition.

## 2022-08-30 NOTE — H&P (Signed)
CC: Here today for surgery  HPI: Shawn Walsh is an 77 y.o. male with history of prostate cancer, kidney stones, whom is seen in the office today for history of possible partial small bowel obstruction.  He has had prostate cancer radiation.   CT A/P 04/15/2020 for left lower quadrant pain showed diffuse bladder wall thickening. Nonobstructing left kidney stone. Mild hepatic steatosis.  CT A/P 03/31/2021 for flank pain showed new distention of multiple small bowel loops within the left hemiabdomen. Apparent transition point to decompressed small bowel mid ileum within the right lower quadrant/superior pelvis. There actually appears to be longstanding narrowing of the ileum in a similar region on prior 03/16/2021 and 04/15/2020 CTs. No upstream bowel dilation was previously noted. Findings suggest a partial small bowel obstruction with likely scarring within the distal ileum. 7 mm left kidney stone.  Colonoscopy with Dr. Levora Angel 06/16/20 showed perianal skin tags, distal rectal radiation proctitis  He is here today with his wife. They report a relatively longstanding history of intermittent obstructive type symptoms with some bloating and nausea/vomiting. This became more pronounced after he had radiation for prostate cancer. He has undergone CTs as noted above. 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 He reports his weight has been stable, he has not had any significant weight loss. He is a vegetarian and eats lots of roughage. He also enjoys fruits/vegetables.  INTERVAL HX He was admitted to the hospital 05/04/2022 after having some obstructive type symptoms with nausea vomiting and bloating. He was found to have mildly dilated loops of small bowel with possible transition point in the right upper pelvis similar in location to his last which was in 03/2021. Mild distal left hydroureter.  Since being discharged, he did have 1 bout of some obstructive type symptoms that  lasted for 8 to 12 hours back on Thursday. That has since resolved. No nausea or vomiting. No fever/chills. No abdominal pain or bloating at present. He is tolerating a diet, having gas and stool daily. He is taking MiraLAX. He reports that he is always constantly worrying about having another obstruction and is incredibly mindful of what he is eating and how he is eating it. Despite all these things, he still seems to have the symptoms.   He denies any changes in health or health history since we met in the office. No new medications/allergies. He states he is ready for surgery today.  Past Medical History:  Diagnosis Date   COVID-19 virus infection    02-2020   History of kidney stones    HLD (hyperlipidemia)    Prostate cancer [facility]) urologist-- dr eskridge/  dr Kathrynn Running   dx 01-14-2019, Stage T1, Gleason 4+5   Wears glasses     Past Surgical History:  Procedure Laterality Date   ABDOMINAL ADHESION SURGERY  1976   [place]   APPENDECTOMY  1972   COLONOSCOPY  01/06/2002   normal   COLONOSCOPY  01/06/2002   CYSTOSCOPY N/A 05/13/2019   Procedure: CYSTOSCOPY FLEXIBLE;  Surgeon: Jerilee Field, MD;  Location: [facility];  Service: Urology;  Laterality: N/A;  NO SEEDS FOUND IN BLADDER   CYSTOSCOPY/URETEROSCOPY/HOLMIUM LASER/STENT PLACEMENT Left 03/22/2022   Procedure: CYSTOSCOPY/ LEFT RETROGRADE/URETEROSCOPY/HOLMIUM LASER/STENT PLACEMENT;  Surgeon: Sebastian Ache, MD;  Location: [facility];  Service: Urology;  Laterality: Left;   EXTRACORPOREAL SHOCK WAVE LITHOTRIPSY Left 03/13/2022   Procedure: LEFT EXTRACORPOREAL SHOCK WAVE LITHOTRIPSY (ESWL);  Surgeon: Marcine Matar, MD;  Location: [facility];  Service:  Urology;  Laterality: Left;  75 MINUTES   RADIOACTIVE SEED IMPLANT N/A 05/13/2019   Procedure: RADIOACTIVE SEED IMPLANT/BRACHYTHERAPY IMPLANT;  Surgeon: Jerilee Field, MD;  Location: [facility];  Service: Urology;  Laterality: N/A;   57   SEEDS IMPLANTED   SPACE OAR INSTILLATION N/A 05/13/2019   Procedure: SPACE OAR INSTILLATION;  Surgeon: Jerilee Field, MD;  Location: [facility];  Service: Urology;  Laterality: N/A;    Family History  Problem Relation Age of Onset   Hyperlipidemia Brother    Heart attack Brother    Colon cancer Neg Hx    Prostate cancer Neg Hx    Pancreatic cancer Neg Hx    Breast cancer Neg Hx    Colon polyps Neg Hx    Esophageal cancer Neg Hx     Social:  reports that he has never smoked. He has never been exposed to tobacco smoke. He has never used smokeless tobacco. He reports that he does not drink alcohol and does not use drugs.  Allergies:  Allergies  Allergen Reactions   Amoxicillin Nausea And Vomiting   Amoxicillin-Pot Clavulanate Nausea And Vomiting   Moxifloxacin Nausea And Vomiting   Nsaids Other (See Comments)    Elevated kidney "tests"    Medications: I have reviewed the patient's current medications.  Results for orders placed or performed during the hospital encounter of 08/30/22 (from the past 48 hour(s))  ABO/Rh     Status: None (Preliminary result)   Collection Time: 08/30/22  7:25 AM  Result Value Ref Range   ABO/RH(D) PENDING     No results found.   PE Blood pressure 137/81, pulse 73, temperature 97.7 F (36.5 C), temperature source Oral, resp. rate 16, height 5\' 7"  (1.702 m), weight 66.7 kg, SpO2 99%. Constitutional: NAD; conversant Eyes: Moist conjunctiva; no lid lag; anicteric Lungs: Normal respiratory effort CV: RRR GI: Abd soft, NT/ND; no palpable hepatosplenomegaly Psychiatric: Appropriate affect  Results for orders placed or performed during the hospital encounter of 08/30/22 (from the past 48 hour(s))  ABO/Rh     Status: None (Preliminary result)   Collection Time: 08/30/22  7:25 AM  Result Value Ref Range   ABO/RH(D) PENDING     No results found.  A/P: Shawn Walsh is an 77 y.o. 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 We discussed that with dietary type modifications including slowing down at mealtime, chewing his food really well, avoiding raw fruits/vegetables if possible versus chewing and better as all means to reduce the chances that this gives him more trouble in the future.   -The anatomy and physiology of the GI tract was reviewed with the patient. The pathophysiology of small bowel obstructions was discussed as well. -We discussed options going forward including further observation versus surgery. We went ahead and reviewed with surgery to involve including diagnostic laparoscopy, possible bowel resection, possible adhesiolysis. -We discussed the potential added risks of surgery including in the radiation setting including wound healing complications. -The procedure, material risks (including, but not limited to, pain, bleeding, infection, scarring, need for blood transfusion, damage to surrounding structures- blood vessels/nerves/viscus/organs, damage to ureter, leak from anastomosis, need for additional procedures, scenarios where a stoma may be necessary and where it may be permanent, worsening of pre-existing medical conditions, chronic diarrhea,  constipation secondary to narcotic use, hernia, recurrence, pneumonia, heart attack, stroke, death) benefits and alternatives to surgery were discussed at length. The patient's questions were answered to his satisfaction, he voiced understanding and elected to proceed with surgery. He acknowledges that he feels the risks of surgery are outweighed by the potential benefits given the quality of life considerations. Additionally, we discussed typical postoperative expectations and the recovery process.   Marin Olp, MD [facility]

## 2022-08-30 NOTE — TOC CM/SW Note (Signed)
Transition of Care [facility]) - Inpatient Brief Assessment   Patient Details  Name: Shawn Walsh MRN: [id number] Date of Birth: June 27, 1945  Transition of Care [facility]) CM/SW Contact:    Armanda Heritage, RN Phone Number: 08/30/2022, 5:00 PM   Clinical Narrative:  Chart reviewed, no TOC needs identified.  Transition of Care Asessment: Insurance and Status: Insurance coverage has been reviewed Patient has primary care physician: Yes Home environment has been reviewed: from home with spouse Prior level of function:: independent Prior/Current Home Services: No current home services Social Determinants of Health Reivew: SDOH reviewed no interventions necessary Readmission risk has been reviewed: Yes Transition of care needs: no transition of care needs at this time

## 2022-08-31 ENCOUNTER — Encounter (HOSPITAL_COMMUNITY): Payer: Self-pay | Admitting: Surgery

## 2022-08-31 ENCOUNTER — Other Ambulatory Visit (HOSPITAL_COMMUNITY): Payer: Self-pay

## 2022-08-31 LAB — CBC
HCT: 33.3 % — ABNORMAL LOW (ref 39.0–52.0)
Hemoglobin: 11 g/dL — ABNORMAL LOW (ref 13.0–17.0)
MCH: 31.1 pg (ref 26.0–34.0)
MCHC: 33 g/dL (ref 30.0–36.0)
MCV: 94.1 fL (ref 80.0–100.0)
Platelets: 154 10*3/uL (ref 150–400)
RBC: 3.54 MIL/uL — ABNORMAL LOW (ref 4.22–5.81)
RDW: 14 % (ref 11.5–15.5)
WBC: 10.8 10*3/uL — ABNORMAL HIGH (ref 4.0–10.5)
nRBC: 0 % (ref 0.0–0.2)

## 2022-08-31 LAB — BASIC METABOLIC PANEL
Anion gap: 6 (ref 5–15)
BUN: 15 mg/dL (ref 8–23)
CO2: 22 mmol/L (ref 22–32)
Calcium: 8.5 mg/dL — ABNORMAL LOW (ref 8.9–10.3)
Chloride: 104 mmol/L (ref 98–111)
Creatinine, Ser: 1.08 mg/dL (ref 0.61–1.24)
GFR, Estimated: >60 mL/min (ref >60)
Glucose, Bld: 124 mg/dL — ABNORMAL HIGH (ref 70–99)
Potassium: 4 mmol/L (ref 3.5–5.1)
Sodium: 132 mmol/L — ABNORMAL LOW (ref 135–145)

## 2022-08-31 MED ORDER — TRAMADOL HCL 50 MG PO TABS
50.0000 mg | ORAL_TABLET | Freq: Four times a day (QID) | ORAL | 0 refills | Status: AC | PRN
  Filled 2022-08-31: qty 15, 4d supply, fill #0

## 2022-08-31 NOTE — Progress Notes (Signed)
  Subjective No acute events. Feeling quite well. Ambulating well. Tolerating clear liquids.  Objective: Vital signs in last 24 hours: Temp:  [97.2 F (36.2 C)-98.6 F (37 C)] 97.7 F (36.5 C) (08/29 1610) Pulse Rate:  [62-81] 63 (08/29 0613) Resp:  [0-18] 18 (08/29 0613) BP: (115-150)/(68-96) 115/71 (08/29 0613) SpO2:  [95 %-100 %] 97 % (08/29 9604)    Intake/Output from previous day: 08/28 0701 - 08/29 0700 In: 3277.5 [P.O.:890; I.V.:2287.5; IV Piggyback:100] Out: 2255 [Urine:2155; Blood:100] Intake/Output this shift: No intake/output data recorded.  Gen: NAD, comfortable CV: RRR Pulm: Normal work of breathing Abd: Soft, NT/ND. Incision c/d without erythema. Ext: SCDs in place  Lab Results: CBC  Recent Labs    08/31/22 0448  WBC 10.8*  HGB 11.0*  HCT 33.3*  PLT 154   BMET Recent Labs    08/31/22 0448  NA 132*  K 4.0  CL 104  CO2 22  GLUCOSE 124*  BUN 15  CREATININE 1.08  CALCIUM 8.5*   PT/INR No results for input(s): "LABPROT", "INR" in the last 72 hours. ABG No results for input(s): "PHART", "HCO3" in the last 72 hours.  Invalid input(s): "PCO2", "PO2"  Studies/Results:  Anti-infectives: Anti-infectives (From admission, onward)    Start     Dose/Rate Route Frequency Ordered Stop   08/30/22 0700  cefoTEtan (CEFOTAN) 2 g in sodium chloride 0.9 % 100 mL IVPB        2 g 200 mL/hr over 30 Minutes Intravenous On call to O.R. 08/30/22 0649 08/30/22 0848        Assessment/Plan: Patient Active Problem List   Diagnosis Date Noted   S/P small bowel resection 08/30/2022   SBO (small bowel obstruction) (HCC) 05/04/2022   Hyperglycemia 05/04/2022   Complicated UTI (urinary tract infection) 04/04/2022   ESBL (extended spectrum beta-lactamase) producing bacteria infection 04/04/2022   Medication management 04/04/2022   Hydronephrosis 04/04/2022   UTI (urinary tract infection) 03/22/2022   Ureteral stone with hydronephrosis 03/22/2022    Ureterolithiasis 03/21/2022   AKI (acute kidney injury) (HCC) 03/21/2022   HLD (hyperlipidemia) 03/21/2022   Malignant neoplasm of prostate (HCC) 02/18/2019   Impacted cerumen of both ears 11/19/2017   Dyspepsia 02/14/2011   s/p Procedure(s): LAPAROSCOPY DIAGNOSTIC, BILATERAL TAP BLOCK SMALL BOWEL RESECTION LYSIS OF ADHESION 08/30/2022  -Doing well -Tolerating liquids, advancing as tolerated -D/C IVF -Continue entereg today, awaiting return of bowel fxn -Reviewed his procedure, findings and plans with him and his daughter over phone -Ppx: SQH, SCDs -Dispo: awaiting return of bowel fxn, toleration of diet   LOS: 1 day   Marin Olp, MD [facility], [facility]

## 2022-08-31 NOTE — Progress Notes (Signed)
Pharmacy Brief Note - Alvimopan (Entereg)  The standing order set for alvimopan (Entereg) now includes an automatic order to discontinue the drug after the patient has had a bowel movement. The change was approved by the Pharmacy & Therapeutics Committee and the Medical Executive Committee.   This patient has had bowel movements documented by nursing. Therefore, alvimopan has been discontinued. If there are questions, please contact the pharmacy at [phone].   Thank you-   Dorna Leitz, PharmD, BCPS 08/31/2022 3:05 PM

## 2022-08-31 NOTE — Plan of Care (Signed)
  Problem: Activity: Goal: Ability to tolerate increased activity will improve Outcome: Progressing   Problem: Bowel/Gastric: Goal: Gastrointestinal status for postoperative course will improve Outcome: Progressing

## 2022-09-01 ENCOUNTER — Other Ambulatory Visit (HOSPITAL_COMMUNITY): Payer: Self-pay

## 2022-09-01 LAB — CBC
HCT: 31.2 % — ABNORMAL LOW (ref 39.0–52.0)
Hemoglobin: 10 g/dL — ABNORMAL LOW (ref 13.0–17.0)
MCH: 30.5 pg (ref 26.0–34.0)
MCHC: 32.1 g/dL (ref 30.0–36.0)
MCV: 95.1 fL (ref 80.0–100.0)
Platelets: 128 10*3/uL — ABNORMAL LOW (ref 150–400)
RBC: 3.28 MIL/uL — ABNORMAL LOW (ref 4.22–5.81)
RDW: 14.4 % (ref 11.5–15.5)
WBC: 7.5 10*3/uL (ref 4.0–10.5)
nRBC: 0 % (ref 0.0–0.2)

## 2022-09-01 LAB — BASIC METABOLIC PANEL
Anion gap: 6 (ref 5–15)
BUN: 15 mg/dL (ref 8–23)
CO2: 24 mmol/L (ref 22–32)
Calcium: 8.3 mg/dL — ABNORMAL LOW (ref 8.9–10.3)
Chloride: 106 mmol/L (ref 98–111)
Creatinine, Ser: 1.11 mg/dL (ref 0.61–1.24)
GFR, Estimated: >60 mL/min (ref >60)
Glucose, Bld: 91 mg/dL (ref 70–99)
Potassium: 3.6 mmol/L (ref 3.5–5.1)
Sodium: 136 mmol/L (ref 135–145)

## 2022-09-01 NOTE — Discharge Summary (Signed)
Patient ID: Shawn Walsh MRN: [id number] DOB/AGE: 1945/05/26 77 y.o.  Admit date: 08/30/2022 Discharge date: 09/01/2022  Discharge Diagnoses Patient Active Problem List   Diagnosis Date Noted   S/P small bowel resection 08/30/2022   SBO (small bowel obstruction) (HCC) 05/04/2022   Hyperglycemia 05/04/2022   Complicated UTI (urinary tract infection) 04/04/2022   ESBL (extended spectrum beta-lactamase) producing bacteria infection 04/04/2022   Medication management 04/04/2022   Hydronephrosis 04/04/2022   UTI (urinary tract infection) 03/22/2022   Ureteral stone with hydronephrosis 03/22/2022   Ureterolithiasis 03/21/2022   AKI (acute kidney injury) (HCC) 03/21/2022   HLD (hyperlipidemia) 03/21/2022   Malignant neoplasm of prostate (HCC) 02/18/2019   Impacted cerumen of both ears 11/19/2017   Dyspepsia 02/14/2011    Consultants None  Procedures Laparoscopic-assisted small bowel resection Laparoscopic lysis of adhesions 100 minutes   Hospital Course: He was admitted postoperatively and recovered uneventfully.  His diet was sequentially advanced which he tolerated.  He began having spontaneous bowel function.  He was mobilizing well on his own.  On 09/01/2022, he is comfortable with and stable for discharge home.  All expectations reviewed.  Follow-up has been arranged.    Allergies as of 09/01/2022       Reactions   Amoxicillin Nausea And Vomiting   Amoxicillin-pot Clavulanate Nausea And Vomiting   Moxifloxacin Nausea And Vomiting   Nsaids Other (See Comments)   Elevated kidney "tests"        Medication List     STOP taking these medications    mometasone 0.1 % cream Commonly known as: ELOCON       TAKE these medications    b complex vitamins capsule Take 1 capsule by mouth daily.   CENTRUM SILVER PO Take 1 tablet by mouth daily with breakfast.   omeprazole 20 MG capsule Commonly known as: PRILOSEC Take 20 mg by mouth daily before breakfast.    polyethylene glycol 17 g packet Commonly known as: MIRALAX / GLYCOLAX Take 17 g by mouth daily.   rosuvastatin 5 MG tablet Commonly known as: CRESTOR Take 5 mg by mouth daily.   traMADol 50 MG tablet Commonly known as: Ultram Take 1 tablet (50 mg total) by mouth every 6 (six) hours as needed for up to 5 days (postop pain not controlled with tylenol and ibuprofen first).   TYLENOL 500 MG tablet Generic drug: acetaminophen Take 500 mg by mouth every 6 (six) hours as needed for mild pain or headache.   Vitamin D3 25 MCG (1000 UT) Caps Take 1,000 Units by mouth daily.          Follow-up Information     Andria Meuse, MD Follow up on 09/19/2022.   Specialties: General Surgery, Colon and Rectal Surgery Why: Please arrive by 8:45 am Contact information: [address] [place] [address] [phone]                 Stephanie Coup. Cliffton Asters, M.D. [facility]

## 2022-09-01 NOTE — Progress Notes (Signed)
  Subjective No acute events. Feeling quite well. Ambulating well. Tolerating soft diet. Having flatus and BMs  Objective: Vital signs in last 24 hours: Temp:  [97.7 F (36.5 C)-98.5 F (36.9 C)] 97.7 F (36.5 C) (08/30 0626) Pulse Rate:  [61-67] 61 (08/30 0626) Resp:  [18] 18 (08/30 0626) BP: (101-130)/(66) 101/66 (08/30 0626) SpO2:  [96 %-100 %] 97 % (08/30 0626) Weight:  [71 kg] 71 kg (08/30 0500) Last BM Date : 09/01/22  Intake/Output from previous day: 08/29 0701 - 08/30 0700 In: 1190 [P.O.:1190] Out: 250 [Urine:250] Intake/Output this shift: Total I/O In: 60 [P.O.:60] Out: -   Gen: NAD, comfortable CV: RRR Pulm: Normal work of breathing Abd: Soft, NT/ND. Incision c/d without erythema. Ext: SCDs in place  Lab Results: CBC  Recent Labs    08/31/22 0448 09/01/22 0434  WBC 10.8* 7.5  HGB 11.0* 10.0*  HCT 33.3* 31.2*  PLT 154 128*   BMET Recent Labs    08/31/22 0448 09/01/22 0434  NA 132* 136  K 4.0 3.6  CL 104 106  CO2 22 24  GLUCOSE 124* 91  BUN 15 15  CREATININE 1.08 1.11  CALCIUM 8.5* 8.3*   PT/INR No results for input(s): "LABPROT", "INR" in the last 72 hours. ABG No results for input(s): "PHART", "HCO3" in the last 72 hours.  Invalid input(s): "PCO2", "PO2"  Studies/Results:  Anti-infectives: Anti-infectives (From admission, onward)    Start     Dose/Rate Route Frequency Ordered Stop   08/30/22 0700  cefoTEtan (CEFOTAN) 2 g in sodium chloride 0.9 % 100 mL IVPB        2 g 200 mL/hr over 30 Minutes Intravenous On call to O.R. 08/30/22 0649 08/30/22 0848        Assessment/Plan: Patient Active Problem List   Diagnosis Date Noted   S/P small bowel resection 08/30/2022   SBO (small bowel obstruction) (HCC) 05/04/2022   Hyperglycemia 05/04/2022   Complicated UTI (urinary tract infection) 04/04/2022   ESBL (extended spectrum beta-lactamase) producing bacteria infection 04/04/2022   Medication management 04/04/2022   Hydronephrosis  04/04/2022   UTI (urinary tract infection) 03/22/2022   Ureteral stone with hydronephrosis 03/22/2022   Ureterolithiasis 03/21/2022   AKI (acute kidney injury) (HCC) 03/21/2022   HLD (hyperlipidemia) 03/21/2022   Malignant neoplasm of prostate (HCC) 02/18/2019   Impacted cerumen of both ears 11/19/2017   Dyspepsia 02/14/2011   s/p Procedure(s): LAPAROSCOPY DIAGNOSTIC, BILATERAL TAP BLOCK SMALL BOWEL RESECTION LYSIS OF ADHESION 08/30/2022  -Doing well -Tolerating diet, ambulating well on his own -We spent time reviewing expectations.  Discussed importance of chewing his food well and slowing down at mealtime.  We have also reviewed things to watch out for in the coming days.  We discussed lifting restrictions of 15 pounds for the first month.  He is comfortable with and stable for discharge home today.  Follow-up my office has been arranged. -Reviewed over phone when he called his son as well.  -Ppx: SQH, SCDs -Dispo: awaiting return of bowel fxn, toleration of diet   LOS: 2 days   Marin Olp, MD [facility]

## 2022-09-01 NOTE — Discharge Instructions (Signed)
POST OP INSTRUCTIONS AFTER COLON SURGERY  DIET: Be sure to include lots of fluids daily to stay hydrated - 64oz of water per day (8, 8 oz glasses).  Avoid fast food or heavy meals for the first couple of weeks as your are more likely to get nauseated. Avoid raw/uncooked fruits or vegetables for the first 4 weeks (its ok to have these if they are blended into smoothie form). If you have fruits/vegetables, make sure they are cooked until soft enough to mash on the roof of your mouth and chew your food well. Otherwise, diet as tolerated.  Take your usually prescribed home medications unless otherwise directed.  PAIN CONTROL: Pain is best controlled by a usual combination of three different methods TOGETHER: Ice/Heat Over the counter pain medication Prescription pain medication Most patients will experience some swelling and bruising around the surgical site.  Ice packs or heating pads (30-60 minutes up to 6 times a day) will help. Some people prefer to use ice alone, heat alone, alternating between ice & heat.  Experiment to what works for you.  Swelling and bruising can take several weeks to resolve.   It is helpful to take an over-the-counter pain medication regularly for the first few weeks: Ibuprofen (Motrin/Advil) - 200mg tabs - take 3 tabs (600mg) every 6 hours as needed for pain (unless you have been directed previously to avoid NSAIDs/ibuprofen) Acetaminophen (Tylenol) - you may take 650mg every 6 hours as needed. You can take this with motrin as they act differently on the body. If you are taking a narcotic pain medication that has acetaminophen in it, do not take over the counter tylenol at the same time. NOTE: You may take both of these medications together - most patients  find it most helpful when alternating between the two (i.e. Ibuprofen at 6am, tylenol at 9am, ibuprofen at 12pm ...) A  prescription for pain medication should be given to you upon discharge.  Take your pain medication as  prescribed if your pain is not adequatly controlled with the over-the-counter pain reliefs mentioned above.  Avoid getting constipated.  Between the surgery and the pain medications, it is common to experience some constipation.  Increasing fluid intake and taking a fiber supplement (such as Metamucil, Citrucel, FiberCon, MiraLax, etc) 1-2 times a day regularly will usually help prevent this problem from occurring.  A mild laxative (prune juice, Milk of Magnesia, MiraLax, etc) should be taken according to package directions if there are no bowel movements after 48 hours.    Dressing: Your incisions are covered in Dermabond which is like sterile superglue for the skin. This will come off on it's own in a couple weeks. It is waterproof and you may bathe normally starting the day after your surgery in a shower. Avoid baths/pools/lakes/oceans until your wounds have fully healed.  ACTIVITIES as tolerated:   Avoid heavy lifting (>10lbs or 1 gallon of milk) for the next 6 weeks. You may resume regular daily activities as tolerated--such as daily self-care, walking, climbing stairs--gradually increasing activities as tolerated.  If you can walk 30 minutes without difficulty, it is safe to try more intense activity such as jogging, treadmill, bicycling, low-impact aerobics.  DO NOT PUSH THROUGH PAIN.  Let pain be your guide: If it hurts to do something, don't do it. You may drive when you are no longer taking prescription pain medication, you can comfortably wear a seatbelt, and you can safely maneuver your car and apply brakes.  FOLLOW UP in our   office Please call [facility] at [phone] to set up an appointment to see your surgeon in the office for a follow-up appointment approximately 2 weeks after your surgery. Make sure that you call for this appointment the day you arrive home to insure a convenient appointment time.  9. If you have disability or family leave forms that need to be completed, you may have  them completed by your primary care physician's office; for return to work instructions, please ask our office staff and they will be happy to assist you in obtaining this documentation   When to call us [phone]: Poor pain control Reactions / problems with new medications (rash/itching, etc)  Fever over 101.5 F (38.5 C) Inability to urinate Nausea/vomiting Worsening swelling or bruising Continued bleeding from incision. Increased pain, redness, or drainage from the incision  The clinic staff is available to answer your questions during regular business hours (8:30am-5pm).  Please don't hesitate to call and ask to speak to one of our nurses for clinical concerns.   A surgeon from [facility] is always on call at the hospitals   If you have a medical emergency, go to the nearest emergency room or call 911.  [facility] [address], [address], [place], [place]  [address] MAIN: [phone] FAX: [phone] [email or web address]  

## 2022-09-01 NOTE — Plan of Care (Signed)
  Problem: Activity: Goal: Ability to tolerate increased activity will improve Outcome: Progressing   Problem: Nutritional: Goal: Will attain and maintain optimal nutritional status will improve Outcome: Progressing

## 2022-09-01 NOTE — Progress Notes (Signed)
AVS instructions reviewed w/ pt & son- both verbalized an understanding . Lap sites intact w/ bruising noted below umbilicus. Pt to talk to his PCP about the pneumonia vaccine. PIV removed as charted. No other questions at this time. Pt to home w/ son via \\POV  - pt to main entrance via w/c

## 2022-09-07 LAB — SURGICAL PATHOLOGY

## 2022-09-10 ENCOUNTER — Encounter (HOSPITAL_COMMUNITY): Payer: Self-pay | Admitting: Emergency Medicine

## 2022-09-10 ENCOUNTER — Inpatient Hospital Stay (HOSPITAL_COMMUNITY)
Admission: EM | Admit: 2022-09-10 | Discharge: 2022-09-13 | DRG: 948 | Disposition: A | Discharge status: Home / Self Care | Payer: Medicare Other | Attending: Surgery | Admitting: Surgery

## 2022-09-10 ENCOUNTER — Other Ambulatory Visit: Payer: Self-pay

## 2022-09-10 ENCOUNTER — Emergency Department (HOSPITAL_COMMUNITY): Payer: Medicare Other

## 2022-09-10 DIAGNOSIS — R9431 Abnormal electrocardiogram [ECG] [EKG]: Secondary | ICD-10-CM | POA: Diagnosis not present

## 2022-09-10 DIAGNOSIS — Z8546 Personal history of malignant neoplasm of prostate: Secondary | ICD-10-CM | POA: Diagnosis not present

## 2022-09-10 DIAGNOSIS — Z8349 Family history of other endocrine, nutritional and metabolic diseases: Secondary | ICD-10-CM

## 2022-09-10 DIAGNOSIS — Z79899 Other long term (current) drug therapy: Secondary | ICD-10-CM | POA: Diagnosis not present

## 2022-09-10 DIAGNOSIS — Z8249 Family history of ischemic heart disease and other diseases of the circulatory system: Secondary | ICD-10-CM | POA: Diagnosis not present

## 2022-09-10 DIAGNOSIS — Z8616 Personal history of COVID-19: Secondary | ICD-10-CM

## 2022-09-10 DIAGNOSIS — E785 Hyperlipidemia, unspecified: Secondary | ICD-10-CM | POA: Diagnosis present

## 2022-09-10 DIAGNOSIS — K56609 Unspecified intestinal obstruction, unspecified as to partial versus complete obstruction: Principal | ICD-10-CM

## 2022-09-10 DIAGNOSIS — Z4682 Encounter for fitting and adjustment of non-vascular catheter: Secondary | ICD-10-CM | POA: Diagnosis not present

## 2022-09-10 DIAGNOSIS — G8918 Other acute postprocedural pain: Principal | ICD-10-CM | POA: Diagnosis present

## 2022-09-10 DIAGNOSIS — Z886 Allergy status to analgesic agent status: Secondary | ICD-10-CM

## 2022-09-10 DIAGNOSIS — R109 Unspecified abdominal pain: Secondary | ICD-10-CM | POA: Diagnosis not present

## 2022-09-10 DIAGNOSIS — R1031 Right lower quadrant pain: Secondary | ICD-10-CM | POA: Diagnosis present

## 2022-09-10 DIAGNOSIS — Z880 Allergy status to penicillin: Secondary | ICD-10-CM

## 2022-09-10 DIAGNOSIS — Z87442 Personal history of urinary calculi: Secondary | ICD-10-CM

## 2022-09-10 DIAGNOSIS — K219 Gastro-esophageal reflux disease without esophagitis: Secondary | ICD-10-CM | POA: Diagnosis present

## 2022-09-10 LAB — URINALYSIS, ROUTINE W REFLEX MICROSCOPIC
Bilirubin Urine: NEGATIVE
Glucose, UA: NEGATIVE mg/dL
Hgb urine dipstick: NEGATIVE
Ketones, ur: NEGATIVE mg/dL
Leukocytes,Ua: NEGATIVE
Nitrite: NEGATIVE
Protein, ur: NEGATIVE mg/dL
Specific Gravity, Urine: 1.016 (ref 1.005–1.030)
pH: 5 (ref 5.0–8.0)

## 2022-09-10 MED ORDER — MORPHINE SULFATE (PF) 4 MG/ML IV SOLN
4.0000 mg | Freq: Once | INTRAVENOUS | Status: AC
  Administered 2022-09-10: 4 mg via INTRAVENOUS
  Filled 2022-09-10: qty 1

## 2022-09-10 MED ORDER — LACTATED RINGERS IV BOLUS
1000.0000 mL | Freq: Once | INTRAVENOUS | Status: AC
  Administered 2022-09-10: 1000 mL via INTRAVENOUS

## 2022-09-10 MED ORDER — ONDANSETRON HCL 4 MG/2ML IJ SOLN
4.0000 mg | Freq: Once | INTRAMUSCULAR | Status: AC
  Administered 2022-09-10: 4 mg via INTRAVENOUS
  Filled 2022-09-10: qty 2

## 2022-09-10 NOTE — ED Triage Notes (Signed)
Pt c/o abdominal pain and n/v that started last night. Pt had SBO surgery on 8/28

## 2022-09-10 NOTE — ED Provider Notes (Signed)
I saw and evaluated the patient, reviewed the resident's note and I agree with the findings and plan.      77 year old male presents with right lower quadrant abdominal pain which began yesterday.  History of small bowel obstruction with lysis of adhesions.  States that this feels similar.  On exam, he has some abdominal distention and his abdomen is tympanitic.  Will order CT scan and reassess   Lorre Nick, MD 09/10/22 2032

## 2022-09-10 NOTE — ED Provider Notes (Signed)
[place] EMERGENCY DEPARTMENT AT [facility] Provider Note   CSN: [id number] Arrival date & time: 09/10/22  1901     History  Chief Complaint  Patient presents with   Abdominal Pain    Shawn Walsh is a 77 y.o. male past medical history of prostate cancer, kidney stones, partial SBO, hyperlipidemia, who recently had a laparoscopic diagnostic small bowel resection and possible lysis of adhesions on August 30, 2022.       Abdominal Pain  Patient reports severe 8/10 abdominal pain that started last night, localized to RLQ with no radiation. Described it as muscle cramping. Patient reports movement and stretching of the abdominal wall makes it worse. Reports associated symptoms of burping, feeling bloated, nausea and dry heaves. Patient denies any constipation. He is taking mirulex which causes diarrhea. He denies any current vomiting.  Patient reports he had a fever this afternoon, temperature of 100.5. Patient reports decrease in appetite. He also notes burning sensation in his throat, attributes it to acid reflux. Otherwise, pt denies any headaches, sore thorat, congestion, cough, chest pain, shortness of breath, dysuria or hematuria, numbess or tingling, no LE edema or pain. No hematochezia or melena. No dizziness or weakness. He spoke with his surgeons this morning who recommended to come to ED.   Home Medications Prior to Admission medications   Medication Sig Start Date End Date Taking? Authorizing Provider  b complex vitamins capsule Take 1 capsule by mouth daily.    [provider]  Cholecalciferol (VITAMIN D3) 1000 UNITS CAPS Take 1,000 Units by mouth daily.    [provider]  Multiple Vitamins-Minerals (CENTRUM SILVER PO) Take 1 tablet by mouth daily with breakfast.    [provider]  omeprazole (PRILOSEC) 20 MG capsule Take 20 mg by mouth daily before breakfast.    [provider]  polyethylene glycol (MIRALAX / GLYCOLAX) 17 g  packet Take 17 g by mouth daily. 07/27/22   Regalado, Belkys A, MD  rosuvastatin (CRESTOR) 5 MG tablet Take 5 mg by mouth daily.    [provider]  TYLENOL 500 MG tablet Take 500 mg by mouth every 6 (six) hours as needed for mild pain or headache.    [provider]      Allergies    Amoxicillin, Amoxicillin-pot clavulanate, Moxifloxacin, and Nsaids    Review of Systems   Review of Systems  Gastrointestinal:  Positive for abdominal pain.  As per HPI  Physical Exam Updated Vital Signs BP (!) 150/94 (BP Location: Left Arm)   Pulse 85   Temp 98 F (36.7 C) (Oral)   Resp 16   Ht 5\' 7"  (1.702 m)   Wt 71 kg   SpO2 99%   BMI 24.52 kg/m  Physical Exam  General: Acute distress HENT: Atraumatic, normocephalic, positive for skin turgor, mild to moderate dry mucous membranes Cardiovascular: Regular rate, regular rhythm, no murmurs, rubs or gallops Pulmonary: Lungs are clear to auscultation bilaterally, no wheezing or crackles Abdomen: Well-healed sutures with Dermabond on abdomen, no redness, ecchymosis, discharge, tenderness appreciated.  Positive for tenderness to palpation of the right lower quadrant. MSK: Range of motion intact Neuro: No focal deficit  ED Results / Procedures / Treatments   Labs (all labs ordered are listed, but only abnormal results are displayed) Labs Reviewed  URINALYSIS, ROUTINE W REFLEX MICROSCOPIC  LIPASE, BLOOD  COMPREHENSIVE METABOLIC PANEL  CBC    EKG None  Radiology No results found.  Procedures Procedures  None  Medications Ordered in ED Medications  lactated ringers bolus 1,000 mL (has no administration in time range)  ondansetron (ZOFRAN) injection 4 mg (has no administration in time range)  morphine (PF) 4 MG/ML injection 4 mg (has no administration in time range)    ED Course/ Medical Decision Making/ A&P   {    Medical Decision Making Amount and/or Complexity of Data Reviewed Labs: ordered. Radiology:  ordered.  Risk Prescription drug management.   This patient presents to the ED for concern of abdominal pain, this involves an extensive number of treatment options, and is a complaint that carries with it a high risk of complications and morbidity.  The differential diagnosis includes small bowel obstruction, large bowel obstruction, ischemic colitis, perforated viscus, pancreatitis, postsurgical abdominal pain.   Co morbidities that complicate the patient evaluation   Recently had a laparoscopic diagnostic small bowel resection and possible lysis of adhesions on August 30, 2022.    Additional history obtained:  Additional history obtained from Daughter  External records from outside source obtained and reviewed including None    Lab Tests:  I Ordered CBC, CMP, lipase. Results pending.     Imaging Studies ordered:  I ordered imaging studies including CT abd pelvis wo contrast   I independently visualized and interpreted imaging that is consistent with the radiologist findings:  IMPRESSION: 1. Findings concerning for developing obstruction at the level of the small-bowel anastomosis in the right lower quadrant. CT with oral contrast or small-bowel series may provide better evaluation.   Cardiac Monitoring: / EKG:  The patient was maintained on a cardiac monitor.  I personally viewed and interpreted the cardiac monitored which showed an underlying rhythm of: NSR   Consultations Obtained:  I requested consultation with the general surgery,  and discussed lab and imaging findings as well as pertinent plan - they will come and evaluate him.    Problem List / ED Course / Critical interventions / Medication management  Suspected Postprocedural bowel obstruction I ordered medication including lactated Ringer's 1 L bolus, morphine 4 mg IV and Zofran 4 mg IV  Reevaluation of the patient after these medicines showed that the patient improved I have reviewed the patients home  medicines and have made adjustments as needed   Social Determinants of Health:  Lives with his wife has a strong family support   Test / Admission - Considered:  This is a 77 year old male with past medical history of prostate cancer with radiation, kidney stones, partial SBO, hyperlipidemia, who recently had a laparoscopic diagnostic small bowel resection and possible lysis of adhesions on August 30, 2022.  Patient presented to ED today for severe 8 out of 10 abdominal pain that started last night localized to right lower quadrant with no radiation.  He endorsed symptoms of burping, feeling bloated, nausea and dry heaves.  Patient reports he is having bowel movements and passing flatus.  He did report he had a fever this afternoon with a temperature of 100.5.  Per physical exam patient is tender to touch of the right lower quadrant.  Otherwise his incision site are healing well.  CT abdomen pelvis findings concerning for developing obstruction at the level of the anastomosis in the right lower quadrant.  General surgery on-call is consulted who stated that they will come and evaluate the patient.  Patient and family are notified about CT findings.  Will continue to follow general surgery recommendations at this time.  Final Clinical Impression(s) / ED Diagnoses Final diagnoses:  None  Rx / DC Orders ED Discharge Orders     None         Jeral Pinch, DO 09/10/22 2245    Lorre Nick, MD 09/12/22 1259

## 2022-09-11 ENCOUNTER — Inpatient Hospital Stay (HOSPITAL_COMMUNITY): Payer: Medicare Other

## 2022-09-11 ENCOUNTER — Emergency Department (HOSPITAL_COMMUNITY): Payer: Medicare Other

## 2022-09-11 DIAGNOSIS — E785 Hyperlipidemia, unspecified: Secondary | ICD-10-CM | POA: Diagnosis present

## 2022-09-11 DIAGNOSIS — Z880 Allergy status to penicillin: Secondary | ICD-10-CM | POA: Diagnosis not present

## 2022-09-11 DIAGNOSIS — G8918 Other acute postprocedural pain: Secondary | ICD-10-CM | POA: Diagnosis present

## 2022-09-11 DIAGNOSIS — Z886 Allergy status to analgesic agent status: Secondary | ICD-10-CM | POA: Diagnosis not present

## 2022-09-11 DIAGNOSIS — Z8249 Family history of ischemic heart disease and other diseases of the circulatory system: Secondary | ICD-10-CM | POA: Diagnosis not present

## 2022-09-11 DIAGNOSIS — Z8546 Personal history of malignant neoplasm of prostate: Secondary | ICD-10-CM | POA: Diagnosis not present

## 2022-09-11 DIAGNOSIS — Z87442 Personal history of urinary calculi: Secondary | ICD-10-CM | POA: Diagnosis not present

## 2022-09-11 DIAGNOSIS — Z8616 Personal history of COVID-19: Secondary | ICD-10-CM | POA: Diagnosis not present

## 2022-09-11 DIAGNOSIS — Z4682 Encounter for fitting and adjustment of non-vascular catheter: Secondary | ICD-10-CM | POA: Diagnosis not present

## 2022-09-11 DIAGNOSIS — R1031 Right lower quadrant pain: Secondary | ICD-10-CM | POA: Diagnosis present

## 2022-09-11 DIAGNOSIS — Z79899 Other long term (current) drug therapy: Secondary | ICD-10-CM | POA: Diagnosis not present

## 2022-09-11 DIAGNOSIS — K219 Gastro-esophageal reflux disease without esophagitis: Secondary | ICD-10-CM | POA: Diagnosis present

## 2022-09-11 DIAGNOSIS — Z8349 Family history of other endocrine, nutritional and metabolic diseases: Secondary | ICD-10-CM | POA: Diagnosis not present

## 2022-09-11 LAB — COMPREHENSIVE METABOLIC PANEL
ALT: 26 U/L (ref 0–44)
AST: 30 U/L (ref 15–41)
Albumin: 3.5 g/dL (ref 3.5–5.0)
Alkaline Phosphatase: 118 U/L (ref 38–126)
Anion gap: 11 (ref 5–15)
BUN: 15 mg/dL (ref 8–23)
CO2: 25 mmol/L (ref 22–32)
Calcium: 9 mg/dL (ref 8.9–10.3)
Chloride: 97 mmol/L — ABNORMAL LOW (ref 98–111)
Creatinine, Ser: 1.06 mg/dL (ref 0.61–1.24)
GFR, Estimated: >60 mL/min (ref >60)
Glucose, Bld: 102 mg/dL — ABNORMAL HIGH (ref 70–99)
Potassium: 3.9 mmol/L (ref 3.5–5.1)
Sodium: 133 mmol/L — ABNORMAL LOW (ref 135–145)
Total Bilirubin: 0.8 mg/dL (ref 0.3–1.2)
Total Protein: 7.3 g/dL (ref 6.5–8.1)

## 2022-09-11 LAB — CBC
HCT: 39.3 % (ref 39.0–52.0)
Hemoglobin: 12.7 g/dL — ABNORMAL LOW (ref 13.0–17.0)
MCH: 30.9 pg (ref 26.0–34.0)
MCHC: 32.3 g/dL (ref 30.0–36.0)
MCV: 95.6 fL (ref 80.0–100.0)
Platelets: 379 10*3/uL (ref 150–400)
RBC: 4.11 MIL/uL — ABNORMAL LOW (ref 4.22–5.81)
RDW: 14.3 % (ref 11.5–15.5)
WBC: 7.1 10*3/uL (ref 4.0–10.5)
nRBC: 0 % (ref 0.0–0.2)

## 2022-09-11 LAB — LIPASE, BLOOD: Lipase: 24 U/L (ref 11–51)

## 2022-09-11 MED ORDER — PROCHLORPERAZINE EDISYLATE 10 MG/2ML IJ SOLN: 5.0000 mg | Freq: Four times a day (QID) | INTRAMUSCULAR | Status: DC | PRN

## 2022-09-11 MED ORDER — DIPHENHYDRAMINE HCL 12.5 MG/5ML PO ELIX: 12.5000 mg | ORAL_SOLUTION | Freq: Four times a day (QID) | ORAL | Status: DC | PRN

## 2022-09-11 MED ORDER — SODIUM CHLORIDE 0.9 % IV SOLN: INTRAVENOUS | Status: DC

## 2022-09-11 MED ORDER — MORPHINE SULFATE (PF) 4 MG/ML IV SOLN
4.0000 mg | Freq: Once | INTRAVENOUS | Status: AC
  Administered 2022-09-11: 4 mg via INTRAVENOUS
  Filled 2022-09-11: qty 1

## 2022-09-11 MED ORDER — PANTOPRAZOLE SODIUM 40 MG IV SOLR
40.0000 mg | Freq: Every day | INTRAVENOUS | Status: DC
  Administered 2022-09-11 – 2022-09-12 (×2): 40 mg via INTRAVENOUS
  Filled 2022-09-11 (×2): qty 10

## 2022-09-11 MED ORDER — ONDANSETRON 4 MG PO TBDP: 4.0000 mg | ORAL_TABLET | Freq: Four times a day (QID) | ORAL | Status: DC | PRN

## 2022-09-11 MED ORDER — PROCHLORPERAZINE MALEATE 10 MG PO TABS: 10.0000 mg | ORAL_TABLET | Freq: Four times a day (QID) | ORAL | Status: DC | PRN

## 2022-09-11 MED ORDER — METHOCARBAMOL 1000 MG/10ML IJ SOLN: 500.0000 mg | Freq: Three times a day (TID) | INTRAVENOUS | Status: DC | PRN

## 2022-09-11 MED ORDER — MORPHINE SULFATE (PF) 4 MG/ML IV SOLN
4.0000 mg | INTRAVENOUS | Status: DC | PRN
  Administered 2022-09-11: 4 mg via INTRAVENOUS
  Filled 2022-09-11: qty 1

## 2022-09-11 MED ORDER — METOPROLOL TARTRATE 5 MG/5ML IV SOLN: 5.0000 mg | Freq: Four times a day (QID) | INTRAVENOUS | Status: DC | PRN

## 2022-09-11 MED ORDER — ONDANSETRON HCL 4 MG/2ML IJ SOLN: 4.0000 mg | Freq: Four times a day (QID) | INTRAMUSCULAR | Status: DC | PRN

## 2022-09-11 MED ORDER — ENOXAPARIN SODIUM 40 MG/0.4ML IJ SOSY
40.0000 mg | PREFILLED_SYRINGE | INTRAMUSCULAR | Status: DC
  Administered 2022-09-11 – 2022-09-13 (×3): 40 mg via SUBCUTANEOUS
  Filled 2022-09-11 (×3): qty 0.4

## 2022-09-11 MED ORDER — MORPHINE SULFATE (PF) 2 MG/ML IV SOLN: 2.0000 mg | INTRAVENOUS | Status: DC | PRN

## 2022-09-11 MED ORDER — DIPHENHYDRAMINE HCL 50 MG/ML IJ SOLN: 12.5000 mg | Freq: Four times a day (QID) | INTRAMUSCULAR | Status: DC | PRN

## 2022-09-11 MED ORDER — ACETAMINOPHEN 650 MG RE SUPP: 650.0000 mg | Freq: Four times a day (QID) | RECTAL | Status: DC | PRN

## 2022-09-11 MED ORDER — LACTATED RINGERS IV SOLN: INTRAVENOUS | Status: DC

## 2022-09-11 MED ORDER — METHOCARBAMOL 500 MG PO TABS: 500.0000 mg | ORAL_TABLET | Freq: Three times a day (TID) | ORAL | Status: DC | PRN

## 2022-09-11 MED ORDER — ACETAMINOPHEN 325 MG PO TABS
650.0000 mg | ORAL_TABLET | Freq: Four times a day (QID) | ORAL | Status: DC | PRN
  Administered 2022-09-11: 650 mg via ORAL
  Filled 2022-09-11: qty 2

## 2022-09-11 MED ORDER — ONDANSETRON HCL 4 MG/2ML IJ SOLN
4.0000 mg | Freq: Once | INTRAMUSCULAR | Status: AC
  Administered 2022-09-11: 4 mg via INTRAVENOUS
  Filled 2022-09-11: qty 2

## 2022-09-11 MED ORDER — DIATRIZOATE MEGLUMINE & SODIUM 66-10 % PO SOLN
90.0000 mL | Freq: Once | ORAL | Status: AC
  Administered 2022-09-11: 90 mL
  Filled 2022-09-11: qty 90

## 2022-09-11 NOTE — H&P (Signed)
[facility] Admission Note  Shawn Walsh 1945-09-15  [id number].    Requesting MD: Gloris Manchester Chief Complaint/Reason for Consult: abdominal pain, n/v  HPI:  Shawn Walsh is a 77 y.o. male who underwent Laparoscopic-assisted small bowel resection and lysis of adhesions on 08/30/22 by Dr. Cliffton Asters for a recurrent SBO. He had an uncomplicated postop period and was discharged home on 8/30. He returned to the ED yesterday with worsening abdominal pain and nausea. It started 2 days ago and has been progressively worse. Pain is diffuse. He had significant nausea at home then vomited in the ED. He has been passing flatus and having some loose stools at home. Most recent BM was yesterday just before coming to the ED. Patient was worked up by EDP with CT scan that was concerning for developing obstruction at the level of the small-bowel anastomosis in the right lower quadrant.   Family History  Problem Relation Age of Onset   Hyperlipidemia Brother    Heart attack Brother    Colon cancer Neg Hx    Prostate cancer Neg Hx    Pancreatic cancer Neg Hx    Breast cancer Neg Hx    Colon polyps Neg Hx    Esophageal cancer Neg Hx     Past Medical History:  Diagnosis Date   COVID-19 virus infection    02-2020   History of kidney stones    HLD (hyperlipidemia)    Prostate cancer [facility] urologist-- dr eskridge/  dr Kathrynn Running   dx 01-14-2019, Stage T1, Gleason 4+5   Wears glasses     Past Surgical History:  Procedure Laterality Date   ABDOMINAL ADHESION SURGERY  1976   [place]   APPENDECTOMY  1972   BOWEL RESECTION N/A 08/30/2022   Procedure: SMALL BOWEL RESECTION;  Surgeon: Andria Meuse, MD;  Location: [facility];  Service: General;  Laterality: N/A;   COLONOSCOPY  01/06/2002   normal   COLONOSCOPY  01/06/2002   CYSTOSCOPY N/A 05/13/2019   Procedure: Derinda Late;  Surgeon: Jerilee Field, MD;  Location: [facility];  Service: Urology;  Laterality: N/A;  NO  SEEDS FOUND IN BLADDER   CYSTOSCOPY/URETEROSCOPY/HOLMIUM LASER/STENT PLACEMENT Left 03/22/2022   Procedure: CYSTOSCOPY/ LEFT RETROGRADE/URETEROSCOPY/HOLMIUM LASER/STENT PLACEMENT;  Surgeon: Sebastian Ache, MD;  Location: [facility];  Service: Urology;  Laterality: Left;   EXTRACORPOREAL SHOCK WAVE LITHOTRIPSY Left 03/13/2022   Procedure: LEFT EXTRACORPOREAL SHOCK WAVE LITHOTRIPSY (ESWL);  Surgeon: Marcine Matar, MD;  Location: [facility];  Service: Urology;  Laterality: Left;  75 MINUTES   LAPAROSCOPY N/A 08/30/2022   Procedure: LAPAROSCOPY DIAGNOSTIC, BILATERAL TAP BLOCK;  Surgeon: Andria Meuse, MD;  Location: [facility];  Service: General;  Laterality: N/A;   LYSIS OF ADHESION N/A 08/30/2022   Procedure: LYSIS OF ADHESION;  Surgeon: Andria Meuse, MD;  Location: [facility];  Service: General;  Laterality: N/A;   RADIOACTIVE SEED IMPLANT N/A 05/13/2019   Procedure: RADIOACTIVE SEED IMPLANT/BRACHYTHERAPY IMPLANT;  Surgeon: Jerilee Field, MD;  Location: [facility];  Service: Urology;  Laterality: N/A;   57  SEEDS IMPLANTED   SPACE OAR INSTILLATION N/A 05/13/2019   Procedure: SPACE OAR INSTILLATION;  Surgeon: Jerilee Field, MD;  Location: [facility];  Service: Urology;  Laterality: N/A;    Social History:  reports that he has never smoked. He has never been exposed to tobacco smoke. He has never used smokeless tobacco. He reports that he does not drink alcohol and does not use drugs.  Allergies:  Allergies  Allergen Reactions   Amoxicillin Nausea And Vomiting   Amoxicillin-Pot Clavulanate Nausea And Vomiting   Moxifloxacin Nausea And Vomiting   Nsaids Other (See Comments)    Elevated kidney "tests"    (Not in a hospital admission)   Prior to Admission medications   Medication Sig Start Date End Date Taking? Authorizing Provider  b complex vitamins capsule Take 1 capsule by mouth daily.   Yes [provider]   polyethylene glycol (MIRALAX / GLYCOLAX) 17 g packet Take 17 g by mouth daily. 07/27/22  Yes Regalado, Belkys A, MD  traMADol (ULTRAM) 50 MG tablet Take 50 mg by mouth every 6 (six) hours as needed.   Yes [provider]  TYLENOL 500 MG tablet Take 500 mg by mouth every 6 (six) hours as needed for mild pain or headache.   Yes [provider]  Cholecalciferol (VITAMIN D3) 1000 UNITS CAPS Take 1,000 Units by mouth daily.    [provider]  Multiple Vitamins-Minerals (CENTRUM SILVER PO) Take 1 tablet by mouth daily with breakfast.    [provider]  omeprazole (PRILOSEC) 20 MG capsule Take 20 mg by mouth daily before breakfast.    [provider]  rosuvastatin (CRESTOR) 5 MG tablet Take 5 mg by mouth daily.    [provider]    Blood pressure (!) 147/81, pulse 92, temperature 98.4 F (36.9 C), temperature source Oral, resp. rate 18, height 5\' 7"  (1.702 m), weight 71 kg, SpO2 100%. Physical Exam: General: pleasant, WD/WN male who is laying in bed in NAD HEENT: head is normocephalic, atraumatic.  Sclera are noninjected.  Pupils equal and round.  Ears and nose without any masses or lesions.  Mouth is pink and moist. Dentition fair Heart: regular, rate, and rhythm Lungs: Respiratory effort nonlabored on room air Abd: laparoscopic and midline incision cdi with dermabond present and no erythema or drainage. soft, mild distension, hyperactive bowel sounds, mild diffuse tenderness worse in the lower quadrants without rebound or guarding  Results for orders placed or performed during the hospital encounter of 09/10/22 (from the past 48 hour(s))  Lipase, blood     Status: None   Collection Time: 09/10/22  7:30 PM  Result Value Ref Range   Lipase 24 11 - 51 U/L    Comment: Performed at [facility], [address] [place], [place] [address]  Comprehensive metabolic panel     Status: Abnormal   Collection Time: 09/10/22  7:30 PM   Result Value Ref Range   Sodium 133 (L) 135 - 145 mmol/L   Potassium 3.9 3.5 - 5.1 mmol/L   Chloride 97 (L) 98 - 111 mmol/L   CO2 25 22 - 32 mmol/L   Glucose, Bld 102 (H) 70 - 99 mg/dL    Comment: Glucose reference range applies only to samples taken after fasting for at least 8 hours.   BUN 15 8 - 23 mg/dL   Creatinine, Ser 9.56 0.61 - 1.24 mg/dL   Calcium 9.0 8.9 - 21.3 mg/dL   Total Protein 7.3 6.5 - 8.1 g/dL   Albumin 3.5 3.5 - 5.0 g/dL   AST 30 15 - 41 U/L   ALT 26 0 - 44 U/L   Alkaline Phosphatase 118 38 - 126 U/L   Total Bilirubin 0.8 0.3 - 1.2 mg/dL   GFR, Estimated >08 >65 mL/min    Comment: (NOTE) Calculated using the CKD-EPI Creatinine Equation (2021)    Anion gap 11  5 - 15    Comment: Performed at [facility], [address], [place], [place] [address]  CBC     Status: Abnormal   Collection Time: 09/10/22  7:30 PM  Result Value Ref Range   WBC 7.1 4.0 - 10.5 K/uL   RBC 4.11 (L) 4.22 - 5.81 MIL/uL   Hemoglobin 12.7 (L) 13.0 - 17.0 g/dL   HCT 60.4 54.0 - 98.1 %   MCV 95.6 80.0 - 100.0 fL   MCH 30.9 26.0 - 34.0 pg   MCHC 32.3 30.0 - 36.0 g/dL   RDW 19.1 47.8 - 29.5 %   Platelets 379 150 - 400 K/uL   nRBC 0.0 0.0 - 0.2 %    Comment: Performed at [facility], [address] [place], [place] [address]  Urinalysis, Routine w reflex microscopic -Urine, Clean Catch     Status: None   Collection Time: 09/10/22  7:51 PM  Result Value Ref Range   Color, Urine YELLOW YELLOW   APPearance CLEAR CLEAR   Specific Gravity, Urine 1.016 1.005 - 1.030   pH 5.0 5.0 - 8.0   Glucose, UA NEGATIVE NEGATIVE mg/dL   Hgb urine dipstick NEGATIVE NEGATIVE   Bilirubin Urine NEGATIVE NEGATIVE   Ketones, ur NEGATIVE NEGATIVE mg/dL   Protein, ur NEGATIVE NEGATIVE mg/dL   Nitrite NEGATIVE NEGATIVE   Leukocytes,Ua NEGATIVE NEGATIVE    Comment: Performed at [facility], [address] [place], [place] [address]   CT ABDOMEN  PELVIS WO CONTRAST  Result Date: 09/10/2022 CLINICAL DATA:  Abdominal pain. History of recent small bowel surgery. EXAM: CT ABDOMEN AND PELVIS WITHOUT CONTRAST TECHNIQUE: Multidetector CT imaging of the abdomen and pelvis was performed following the standard protocol without IV contrast. RADIATION DOSE REDUCTION: This exam was performed according to the departmental dose-optimization program which includes automated exposure control, adjustment of the mA and/or kV according to patient size and/or use of iterative reconstruction technique. COMPARISON:  CT abdomen pelvis dated 07/24/2022. FINDINGS: Evaluation of this exam is limited in the absence of intravenous contrast. Lower chest: The visualized lung bases are clear. There is coronary vascular calcification. No intra-abdominal free air. Trace free fluid in the right lower pelvis. Hepatobiliary: The liver is unremarkable. No biliary dilatation. The gallbladder is unremarkable. Pancreas: Unremarkable. No pancreatic ductal dilatation or surrounding inflammatory changes. Spleen: Normal in size without focal abnormality. Adrenals/Urinary Tract: The adrenal glands are unremarkable. There is no hydronephrosis or nephrolithiasis on either side. The visualized ureters and urinary bladder appear unremarkable. Stomach/Bowel: Evaluation of the bowel is limited in the absence of oral contrast. There is postsurgical changes of small-bowel resection with anastomotic staple line in the right lower quadrant. There is diffuse dilatation of small-bowel loops with transition in the right lower quadrant at the ileo ileal anastomosis. Although the dilated bowel loops may represent postoperative ileus, findings concerning for a degree of obstruction at the level of the anastomosis. Small-bowel series may provide better evaluation. The colon is unremarkable. Appendectomy. Vascular/Lymphatic: Mild atherosclerotic calcification of the abdominal aorta. The IVC is unremarkable. No portal  venous gas. There is no adenopathy. Reproductive: Prostate brachytherapy seeds. Other: Midline vertical anterior pelvic wall incisional scar. Musculoskeletal: Bilateral L5 pars defects. Grade 1 L5-S1 anterolisthesis. No acute osseous pathology. IMPRESSION: 1. Findings concerning for developing obstruction at the level of the small-bowel anastomosis in the right lower quadrant. CT with oral contrast or small-bowel series may provide better evaluation. 2.  Aortic Atherosclerosis (ICD10-I70.0). Electronically Signed  By: Elgie Collard M.D.   On: 09/10/2022 21:17      Assessment/Plan POD#12 s/p laparoscopic assisted LOA and SBR 8/28 by Dr. Cliffton Asters for Recurrent SBO Postoperative Ileus vs partial SBO - Patient with 2 days of worsening abdominal pain, nausea, and vomiting. He has been passing flatus and having loose stools. CT scan concerning for possible obstruction at the level of the small-bowel anastomosis in the right lower quadrant. Suspect either postoperative ileus versus partial small bowel obstruction. No indication for acute surgical intervention. Recommend NG tube for decompression. Will start patient on SBO protocol with gastrograffin.  ID - none VTE - SCDs, lovenox FEN - IVF, NPO/NGT to LIWS Foley - none Dispo - Admit to med-surg for observation   I reviewed ED provider notes, last 24 h vitals and pain scores, last 48 h intake and output, last 24 h labs and trends, and last 24 h imaging results.   Franne Forts, [facility] Surgery 09/11/2022, 8:31 AM Please see Amion for pager number during day hours 7:00am-4:30pm

## 2022-09-12 LAB — CBC
HCT: 36.5 % — ABNORMAL LOW (ref 39.0–52.0)
Hemoglobin: 11.4 g/dL — ABNORMAL LOW (ref 13.0–17.0)
MCH: 30.9 pg (ref 26.0–34.0)
MCHC: 31.2 g/dL (ref 30.0–36.0)
MCV: 98.9 fL (ref 80.0–100.0)
Platelets: 329 10*3/uL (ref 150–400)
RBC: 3.69 MIL/uL — ABNORMAL LOW (ref 4.22–5.81)
RDW: 14.3 % (ref 11.5–15.5)
WBC: 5.6 10*3/uL (ref 4.0–10.5)
nRBC: 0 % (ref 0.0–0.2)

## 2022-09-12 LAB — BASIC METABOLIC PANEL
Anion gap: 13 (ref 5–15)
BUN: 15 mg/dL (ref 8–23)
CO2: 21 mmol/L — ABNORMAL LOW (ref 22–32)
Calcium: 8.9 mg/dL (ref 8.9–10.3)
Chloride: 103 mmol/L (ref 98–111)
Creatinine, Ser: 1.11 mg/dL (ref 0.61–1.24)
GFR, Estimated: >60 mL/min (ref >60)
Glucose, Bld: 61 mg/dL — ABNORMAL LOW (ref 70–99)
Potassium: 3.9 mmol/L (ref 3.5–5.1)
Sodium: 137 mmol/L (ref 135–145)

## 2022-09-12 LAB — MAGNESIUM: Magnesium: 1.9 mg/dL (ref 1.7–2.4)

## 2022-09-12 LAB — PHOSPHORUS: Phosphorus: 3.3 mg/dL (ref 2.5–4.6)

## 2022-09-12 NOTE — Progress Notes (Signed)
   09/12/22 1141  TOC Brief Assessment  Insurance and Status Reviewed  Patient has primary care physician Yes  Home environment has been reviewed Resides with spouse  Prior level of function: Independent at baseline  Prior/Current Home Services No current home services  Social Determinants of Health Reivew SDOH reviewed no interventions necessary  Readmission risk has been reviewed Yes  Transition of care needs no transition of care needs at this time

## 2022-09-12 NOTE — Plan of Care (Signed)
  Problem: Activity: Goal: Ability to tolerate increased activity will improve Outcome: Progressing   Problem: Bowel/Gastric: Goal: Gastrointestinal status for postoperative course will improve Outcome: Progressing   Problem: Health Behavior/Discharge Planning: Goal: Identification of community resources to assist with postoperative recovery needs will improve Outcome: Progressing   Problem: Nutritional: Goal: Will attain and maintain optimal nutritional status will improve Outcome: Progressing

## 2022-09-12 NOTE — Progress Notes (Signed)
Mobility Specialist - Progress Note   09/12/22 1513  Mobility  Activity Ambulated independently in hallway  Level of Assistance Independent  Assistive Device None  Distance Ambulated (ft) 500 ft  Activity Response Tolerated well  Mobility Referral Yes  $Mobility charge 1 Mobility  Mobility Specialist Start Time (ACUTE ONLY) 0259  Mobility Specialist Stop Time (ACUTE ONLY) 0308  Mobility Specialist Time Calculation (min) (ACUTE ONLY) 9 min   Pt received in bed and agreeable to mobility. No complaints during session. Pt to bed after session with all needs met.    [facility]

## 2022-09-12 NOTE — Progress Notes (Signed)
  Subjective No acute events. Feeling much better. No n/v. NG output thin and low volume. No abdominal pain  Objective: Vital signs in last 24 hours: Temp:  [97.9 F (36.6 C)-98.5 F (36.9 C)] 97.9 F (36.6 C) (09/10 0559) Pulse Rate:  [72-81] 72 (09/10 0559) Resp:  [12-19] 18 (09/10 0559) BP: (107-142)/(69-96) 118/74 (09/10 0559) SpO2:  [94 %-98 %] 98 % (09/10 0559) Last BM Date : 09/11/22  Intake/Output from previous day: 09/09 0701 - 09/10 0700 In: 1079.2 [I.V.:1079.2] Out: 450 [Emesis/NG output:450] Intake/Output this shift: No intake/output data recorded.  Gen: NAD, comfortable CV: RRR Pulm: Normal work of breathing Abd: Soft, NT/ND. Incision c/d/I without erythema Ext: SCDs in place  Lab Results: CBC  Recent Labs    09/10/22 1930 09/12/22 0500  WBC 7.1 5.6  HGB 12.7* 11.4*  HCT 39.3 36.5*  PLT 379 329   BMET Recent Labs    09/10/22 1930 09/12/22 0500  NA 133* 137  K 3.9 3.9  CL 97* 103  CO2 25 21*  GLUCOSE 102* 61*  BUN 15 15  CREATININE 1.06 1.11  CALCIUM 9.0 8.9   PT/INR No results for input(s): "LABPROT", "INR" in the last 72 hours. ABG No results for input(s): "PHART", "HCO3" in the last 72 hours.  Invalid input(s): "PCO2", "PO2"  Studies/Results:  Anti-infectives: Anti-infectives (From admission, onward)    None        Assessment/Plan: Patient Active Problem List   Diagnosis Date Noted   Postoperative pain 09/11/2022   S/P small bowel resection 08/30/2022   SBO (small bowel obstruction) (HCC) 05/04/2022   Hyperglycemia 05/04/2022   Complicated UTI (urinary tract infection) 04/04/2022   ESBL (extended spectrum beta-lactamase) producing bacteria infection 04/04/2022   Medication management 04/04/2022   Hydronephrosis 04/04/2022   UTI (urinary tract infection) 03/22/2022   Ureteral stone with hydronephrosis 03/22/2022   Ureterolithiasis 03/21/2022   AKI (acute kidney injury) (HCC) 03/21/2022   HLD (hyperlipidemia)  03/21/2022   Malignant neoplasm of prostate (HCC) 02/18/2019   Impacted cerumen of both ears 11/19/2017   Dyspepsia 02/14/2011   POD#13 s/p laparoscopic assisted LOA and SBR 8/28 by Dr. Cliffton Asters for Recurrent SBO Postoperative Ileus vs partial SBO  - Feeling much better - Contrast now in rectum - Will remove NG and give liquids, if tolerating diet as tolerated   ID - none VTE - SCDs, lovenox FEN - IVF Foley - none Dispo - if doing well and diet going down fine, possible D/c home 9/11   LOS: 1 day   Shawn Olp, MD [facility] Surgery, A [facility]

## 2022-09-13 NOTE — Progress Notes (Signed)
  Subjective No acute events. Continues to feel well today. No n/v - on soft diet. Having bowel fxn.  Objective: Vital signs in last 24 hours: Temp:  [98 F (36.7 C)-98.9 F (37.2 C)] 98.7 F (37.1 C) (09/11 0537) Pulse Rate:  [71-98] 71 (09/11 0537) Resp:  [12-18] 18 (09/11 0537) BP: (109-147)/(65-89) 133/84 (09/11 0537) SpO2:  [95 %-100 %] 95 % (09/11 0537) Last BM Date : 09/12/22  Intake/Output from previous day: 09/10 0701 - 09/11 0700 In: 2006.8 [P.O.:840; I.V.:1166.8] Out: 150 [Urine:100; Emesis/NG output:50] Intake/Output this shift: No intake/output data recorded.  Gen: NAD, comfortable CV: RRR Pulm: Normal work of breathing Abd: Soft, NT/ND. Incision c/d/I without erythema Ext: SCDs in place  Lab Results: CBC  Recent Labs    09/10/22 1930 09/12/22 0500  WBC 7.1 5.6  HGB 12.7* 11.4*  HCT 39.3 36.5*  PLT 379 329   BMET Recent Labs    09/10/22 1930 09/12/22 0500  NA 133* 137  K 3.9 3.9  CL 97* 103  CO2 25 21*  GLUCOSE 102* 61*  BUN 15 15  CREATININE 1.06 1.11  CALCIUM 9.0 8.9   PT/INR No results for input(s): "LABPROT", "INR" in the last 72 hours. ABG No results for input(s): "PHART", "HCO3" in the last 72 hours.  Invalid input(s): "PCO2", "PO2"  Studies/Results:  Anti-infectives: Anti-infectives (From admission, onward)    None        Assessment/Plan: Patient Active Problem List   Diagnosis Date Noted   Postoperative pain 09/11/2022   S/P small bowel resection 08/30/2022   SBO (small bowel obstruction) (HCC) 05/04/2022   Hyperglycemia 05/04/2022   Complicated UTI (urinary tract infection) 04/04/2022   ESBL (extended spectrum beta-lactamase) producing bacteria infection 04/04/2022   Medication management 04/04/2022   Hydronephrosis 04/04/2022   UTI (urinary tract infection) 03/22/2022   Ureteral stone with hydronephrosis 03/22/2022   Ureterolithiasis 03/21/2022   AKI (acute kidney injury) (HCC) 03/21/2022   HLD  (hyperlipidemia) 03/21/2022   Malignant neoplasm of prostate (HCC) 02/18/2019   Impacted cerumen of both ears 11/19/2017   Dyspepsia 02/14/2011   POD#14 s/p laparoscopic assisted LOA and SBR 8/28 by Dr. Cliffton Asters for Recurrent SBO Postoperative Ileus vs partial SBO  - Feeling much better - Family present via phone   ID - none VTE - SCDs, lovenox FEN - IVF Foley - none Dispo - he is comfortable with and stable for discharge home. Reviewed general expectations. Discussed pureed consistency diet and starting off with protein shakes and then supplementing with food following. Discussed smaller more frequent meals as opposed to 2-3 large meals per day. Avoid ruffage if possible for next ~month.   LOS: 2 days   Marin Olp, MD [facility], [facility]

## 2022-09-13 NOTE — Progress Notes (Signed)
Patient was given discharge instructions, and all questions were answered.  Patient was stable for discharge and was taken to the main exit by wheelchair. 

## 2022-09-13 NOTE — Discharge Instructions (Addendum)
POST OP INSTRUCTIONS  DIET: For the next couple of weeks, start with protein shakes and then supplement with softer foods on top of that. Go slow at meal time with eating and consider eating 3-4 smaller meals per day as opposed to 2-3 larger meals. Chew your food well. Avoid ruffage for the next month (raw uncooked fruits/vegetables, salad)  Take your usually prescribed home medications unless otherwise directed.  PAIN CONTROL: Pain is best controlled by a usual combination of three different methods TOGETHER: Ice/Heat Over the counter pain medication Prescription pain medication Most patients will experience some swelling and bruising around the surgical site.  Ice packs or heating pads (30-60 minutes up to 6 times a day) will help. Some people prefer to use ice alone, heat alone, alternating between ice & heat.  Experiment to what works for you.  Swelling and bruising can take several weeks to resolve.   It is helpful to take an over-the-counter pain medication regularly for the first few weeks: Ibuprofen (Motrin/Advil) - 200mg  tabs - take 3 tabs (600mg ) every 6 hours as needed for pain Acetaminophen (Tylenol) - you may take 650mg  every 6 hours as needed. You can take this with motrin as they act differently on the body. If you are taking a narcotic pain medication that has acetaminophen in it, do not take over the counter tylenol at the same time.  Avoid getting constipated.  Between the surgery and the pain medications, it is common to experience some constipation.  Increasing fluid intake and taking a fiber supplement (such as Metamucil, Citrucel, FiberCon, MiraLax, etc) 1-2 times a day regularly will usually help prevent this problem from occurring.  A mild laxative (prune juice, Milk of Magnesia, MiraLax, etc) should be taken according to package directions if there are no bowel movements after 48 hours.    Dressing: Your incisions are covered in Dermabond which is like sterile superglue for  the skin. This will come off on it's own in a couple weeks. It is waterproof and you may bathe normally starting the day after your surgery in a shower. Avoid baths/pools/lakes/oceans until your wounds have fully healed.  ACTIVITIES as tolerated:   Avoid heavy lifting (>10lbs or 1 gallon of milk) for the next 6 weeks. You may resume regular (light) daily activities beginning the next day--such as daily self-care, walking, climbing stairs--gradually increasing activities as tolerated.  If you can walk 30 minutes without difficulty, it is safe to try more intense activity such as jogging, treadmill, bicycling, low-impact aerobics.  DO NOT PUSH THROUGH PAIN.  Let pain be your guide: If it hurts to do something, don't do it. You may drive when you are no longer taking prescription pain medication, you can comfortably wear a seatbelt, and you can safely maneuver your car and apply brakes.   FOLLOW UP in our office Please call [facility] at [phone] to set up an appointment to see your surgeon in the office for a follow-up appointment approximately 2 weeks after your surgery. Make sure that you call for this appointment the day you arrive home to insure a convenient appointment time.  9. If you have disability or family leave forms that need to be completed, you may have them completed by your primary care physician's office; for return to work instructions, please ask our office staff and they will be happy to assist you in obtaining this documentation   When to call us [phone]: Poor pain control Reactions / problems with new medications (  rash/itching, etc)  Fever over 101.5 F (38.5 C) Inability to urinate Nausea/vomiting Worsening swelling or bruising Continued bleeding from incision. Increased pain, redness, or drainage from the incision  The clinic staff is available to answer your questions during regular business hours (8:30am-5pm).  Please don't hesitate to call and ask to speak to  one of our nurses for clinical concerns.   A surgeon from [facility] is always on call at the hospitals   If you have a medical emergency, go to the nearest emergency room or call 911.  [facility] [address], [place], [place]  [address] MAIN: [phone] FAX: [phone] [email or web address]

## 2022-09-13 NOTE — Plan of Care (Signed)

## 2022-09-14 NOTE — Discharge Summary (Signed)
Patient ID: Shawn Walsh MRN: [id number] DOB/AGE: [id number] 77 y.o.  Admit date: 09/10/2022 Discharge date: 09/13/2022  Discharge Diagnoses Patient Active Problem List   Diagnosis Date Noted   Postoperative pain 09/11/2022   S/P small bowel resection 08/30/2022   SBO (small bowel obstruction) (HCC) 05/04/2022   Hyperglycemia 05/04/2022   Complicated UTI (urinary tract infection) 04/04/2022   ESBL (extended spectrum beta-lactamase) producing bacteria infection 04/04/2022   Medication management 04/04/2022   Hydronephrosis 04/04/2022   UTI (urinary tract infection) 03/22/2022   Ureteral stone with hydronephrosis 03/22/2022   Ureterolithiasis 03/21/2022   AKI (acute kidney injury) (HCC) 03/21/2022   HLD (hyperlipidemia) 03/21/2022   Malignant neoplasm of prostate (HCC) 02/18/2019   Impacted cerumen of both ears 11/19/2017   Dyspepsia 02/14/2011    Consultants None  Procedures None  Hospital Course: Admitted with NG and presumed ileus vs partial SBO. He rapidly improved. Contrast noted to have made it to rectum by HD#1. NG removed on HD#1. His diet was advanced and he tolerated. On 09/13/22 he is comfortable with and stable for discharge home. Expectations were then reviewed. Questions were answered. Follow-up arranged.    Allergies as of 09/13/2022       Reactions   Amoxicillin Nausea And Vomiting   Amoxicillin-pot Clavulanate Nausea And Vomiting   Moxifloxacin Nausea And Vomiting   Nsaids Other (See Comments)   Elevated kidney "tests"        Medication List     TAKE these medications    b complex vitamins capsule Take 1 capsule by mouth daily.   CENTRUM SILVER PO Take 1 tablet by mouth daily with breakfast.   omeprazole 20 MG capsule Commonly known as: PRILOSEC Take 20 mg by mouth daily before breakfast.   polyethylene glycol 17 g packet Commonly known as: MIRALAX / GLYCOLAX Take 17 g by mouth daily.   rosuvastatin 5 MG tablet Commonly known  as: CRESTOR Take 5 mg by mouth daily.   traMADol 50 MG tablet Commonly known as: ULTRAM Take 50 mg by mouth every 6 (six) hours as needed.   TYLENOL 500 MG tablet Generic drug: acetaminophen Take 500 mg by mouth every 6 (six) hours as needed for mild pain or headache.   Vitamin D3 25 MCG (1000 UT) Caps Take 1,000 Units by mouth daily.          Follow-up Information     Andria Meuse, MD Follow up on 09/19/2022.   Specialties: General Surgery, Colon and Rectal Surgery Why: Please arrive by 8:45 am Contact information: [address] [place] [address] [phone]                 Stephanie Coup. Cliffton Asters, M.D. [facility]

## 2022-09-20 ENCOUNTER — Other Ambulatory Visit (HOSPITAL_COMMUNITY): Payer: Self-pay

## 2022-09-26 DIAGNOSIS — R630 Anorexia: Secondary | ICD-10-CM | POA: Diagnosis not present

## 2022-09-26 DIAGNOSIS — R140 Abdominal distension (gaseous): Secondary | ICD-10-CM | POA: Diagnosis not present

## 2022-09-26 DIAGNOSIS — R1084 Generalized abdominal pain: Secondary | ICD-10-CM | POA: Diagnosis not present

## 2022-09-26 DIAGNOSIS — Z6821 Body mass index (BMI) 21.0-21.9, adult: Secondary | ICD-10-CM | POA: Diagnosis not present

## 2022-09-29 DIAGNOSIS — Z23 Encounter for immunization: Secondary | ICD-10-CM | POA: Diagnosis not present

## 2022-10-05 DIAGNOSIS — H2513 Age-related nuclear cataract, bilateral: Secondary | ICD-10-CM | POA: Diagnosis not present

## 2022-10-05 DIAGNOSIS — H5203 Hypermetropia, bilateral: Secondary | ICD-10-CM | POA: Diagnosis not present

## 2022-10-05 DIAGNOSIS — H524 Presbyopia: Secondary | ICD-10-CM | POA: Diagnosis not present

## 2022-10-05 DIAGNOSIS — H52223 Regular astigmatism, bilateral: Secondary | ICD-10-CM | POA: Diagnosis not present

## 2022-10-05 DIAGNOSIS — H04123 Dry eye syndrome of bilateral lacrimal glands: Secondary | ICD-10-CM | POA: Diagnosis not present

## 2022-10-05 DIAGNOSIS — H353131 Nonexudative age-related macular degeneration, bilateral, early dry stage: Secondary | ICD-10-CM | POA: Diagnosis not present

## 2022-10-06 DIAGNOSIS — R7309 Other abnormal glucose: Secondary | ICD-10-CM | POA: Diagnosis not present

## 2022-10-06 DIAGNOSIS — R140 Abdominal distension (gaseous): Secondary | ICD-10-CM | POA: Diagnosis not present

## 2022-10-26 DIAGNOSIS — R948 Abnormal results of function studies of other organs and systems: Secondary | ICD-10-CM | POA: Diagnosis not present

## 2022-10-26 DIAGNOSIS — E349 Endocrine disorder, unspecified: Secondary | ICD-10-CM | POA: Diagnosis not present

## 2022-11-02 DIAGNOSIS — N401 Enlarged prostate with lower urinary tract symptoms: Secondary | ICD-10-CM | POA: Diagnosis not present

## 2022-11-02 DIAGNOSIS — E349 Endocrine disorder, unspecified: Secondary | ICD-10-CM | POA: Diagnosis not present

## 2022-11-02 DIAGNOSIS — N201 Calculus of ureter: Secondary | ICD-10-CM | POA: Diagnosis not present

## 2022-11-02 DIAGNOSIS — R350 Frequency of micturition: Secondary | ICD-10-CM | POA: Diagnosis not present

## 2022-11-02 DIAGNOSIS — Z8546 Personal history of malignant neoplasm of prostate: Secondary | ICD-10-CM | POA: Diagnosis not present

## 2022-12-15 DIAGNOSIS — E349 Endocrine disorder, unspecified: Secondary | ICD-10-CM | POA: Diagnosis not present

## 2023-01-24 DIAGNOSIS — Z9049 Acquired absence of other specified parts of digestive tract: Secondary | ICD-10-CM | POA: Diagnosis not present

## 2023-01-24 DIAGNOSIS — K56609 Unspecified intestinal obstruction, unspecified as to partial versus complete obstruction: Secondary | ICD-10-CM | POA: Diagnosis not present

## 2023-01-26 DIAGNOSIS — E349 Endocrine disorder, unspecified: Secondary | ICD-10-CM | POA: Diagnosis not present

## 2023-01-26 DIAGNOSIS — Z8546 Personal history of malignant neoplasm of prostate: Secondary | ICD-10-CM | POA: Diagnosis not present

## 2023-01-29 DIAGNOSIS — R7309 Other abnormal glucose: Secondary | ICD-10-CM | POA: Diagnosis not present

## 2023-01-29 DIAGNOSIS — Z79899 Other long term (current) drug therapy: Secondary | ICD-10-CM | POA: Diagnosis not present

## 2023-01-29 DIAGNOSIS — E7800 Pure hypercholesterolemia, unspecified: Secondary | ICD-10-CM | POA: Diagnosis not present

## 2023-02-01 DIAGNOSIS — Z0000 Encounter for general adult medical examination without abnormal findings: Secondary | ICD-10-CM | POA: Diagnosis not present

## 2023-02-01 DIAGNOSIS — E349 Endocrine disorder, unspecified: Secondary | ICD-10-CM | POA: Diagnosis not present

## 2023-02-01 DIAGNOSIS — N5201 Erectile dysfunction due to arterial insufficiency: Secondary | ICD-10-CM | POA: Diagnosis not present

## 2023-02-01 DIAGNOSIS — K2100 Gastro-esophageal reflux disease with esophagitis, without bleeding: Secondary | ICD-10-CM | POA: Diagnosis not present

## 2023-02-01 DIAGNOSIS — Z23 Encounter for immunization: Secondary | ICD-10-CM | POA: Diagnosis not present

## 2023-02-01 DIAGNOSIS — R7303 Prediabetes: Secondary | ICD-10-CM | POA: Diagnosis not present

## 2023-02-01 DIAGNOSIS — M79676 Pain in unspecified toe(s): Secondary | ICD-10-CM | POA: Diagnosis not present

## 2023-02-01 DIAGNOSIS — E7800 Pure hypercholesterolemia, unspecified: Secondary | ICD-10-CM | POA: Diagnosis not present

## 2023-02-01 DIAGNOSIS — Z79899 Other long term (current) drug therapy: Secondary | ICD-10-CM | POA: Diagnosis not present

## 2023-02-01 DIAGNOSIS — Z8546 Personal history of malignant neoplasm of prostate: Secondary | ICD-10-CM | POA: Diagnosis not present

## 2023-02-19 ENCOUNTER — Ambulatory Visit (INDEPENDENT_AMBULATORY_CARE_PROVIDER_SITE_OTHER): Payer: Medicare Other | Admitting: Podiatry

## 2023-02-19 ENCOUNTER — Encounter: Payer: Self-pay | Admitting: Podiatry

## 2023-02-19 ENCOUNTER — Ambulatory Visit (INDEPENDENT_AMBULATORY_CARE_PROVIDER_SITE_OTHER): Payer: Medicare Other

## 2023-02-19 DIAGNOSIS — D3610 Benign neoplasm of peripheral nerves and autonomic nervous system, unspecified: Secondary | ICD-10-CM

## 2023-02-19 DIAGNOSIS — M79671 Pain in right foot: Secondary | ICD-10-CM

## 2023-02-19 DIAGNOSIS — M792 Neuralgia and neuritis, unspecified: Secondary | ICD-10-CM

## 2023-02-19 MED ORDER — GABAPENTIN 100 MG PO CAPS: 100.0000 mg | ORAL_CAPSULE | Freq: Every day | ORAL | 0 refills | Status: AC

## 2023-02-19 NOTE — Progress Notes (Signed)
 Subjective:   Patient ID: Shawn Walsh, male   DOB: 78 y.o.   MRN: [id number]   HPI Chief Complaint  Patient presents with   Nail Problem   Toe Pain    Rm#11 Toe pain second toe very sensitive when touching no injuries to toes. Pain states feels like discomfort coming from toe nail.    78 year old male presents the office today with the above concerns.  States he has been having tingling, numbness pain to his right second toe for the last 3 to 4 years.  Previously did see neurologist for this.  He did at one time had some medial pain along the big toe joint he points to but this is since resolved.  He wears a silicone sleeve during the day.  Most of his symptoms are at nighttime.  He does foot barefoot quite a bit around the house.  Second toenails been thick for many years after he injured the toenail in [place].  No recent injury.    Review of Systems  All other systems reviewed and are negative.  Past Medical History:  Diagnosis Date   COVID-19 virus infection    02-2020   History of kidney stones    HLD (hyperlipidemia)    Prostate cancer [facility]) urologist-- dr eskridge/  dr Kathrynn Running   dx 01-14-2019, Stage T1, Gleason 4+5   Wears glasses     Past Surgical History:  Procedure Laterality Date   ABDOMINAL ADHESION SURGERY  1976   [place]   APPENDECTOMY  1972   BOWEL RESECTION N/A 08/30/2022   Procedure: SMALL BOWEL RESECTION;  Surgeon: Andria Meuse, MD;  Location: [facility];  Service: General;  Laterality: N/A;   COLONOSCOPY  01/06/2002   normal   COLONOSCOPY  01/06/2002   CYSTOSCOPY N/A 05/13/2019   Procedure: Derinda Late;  Surgeon: Jerilee Field, MD;  Location: [facility];  Service: Urology;  Laterality: N/A;  NO SEEDS FOUND IN BLADDER   CYSTOSCOPY/URETEROSCOPY/HOLMIUM LASER/STENT PLACEMENT Left 03/22/2022   Procedure: CYSTOSCOPY/ LEFT RETROGRADE/URETEROSCOPY/HOLMIUM LASER/STENT PLACEMENT;  Surgeon: Sebastian Ache, MD;  Location: [facility];  Service: Urology;  Laterality: Left;   EXTRACORPOREAL SHOCK WAVE LITHOTRIPSY Left 03/13/2022   Procedure: LEFT EXTRACORPOREAL SHOCK WAVE LITHOTRIPSY (ESWL);  Surgeon: Marcine Matar, MD;  Location: [facility];  Service: Urology;  Laterality: Left;  75 MINUTES   LAPAROSCOPY N/A 08/30/2022   Procedure: LAPAROSCOPY DIAGNOSTIC, BILATERAL TAP BLOCK;  Surgeon: Andria Meuse, MD;  Location: [facility];  Service: General;  Laterality: N/A;   LYSIS OF ADHESION N/A 08/30/2022   Procedure: LYSIS OF ADHESION;  Surgeon: Andria Meuse, MD;  Location: [facility];  Service: General;  Laterality: N/A;   RADIOACTIVE SEED IMPLANT N/A 05/13/2019   Procedure: RADIOACTIVE SEED IMPLANT/BRACHYTHERAPY IMPLANT;  Surgeon: Jerilee Field, MD;  Location: [facility];  Service: Urology;  Laterality: N/A;   57  SEEDS IMPLANTED   SPACE OAR INSTILLATION N/A 05/13/2019   Procedure: SPACE OAR INSTILLATION;  Surgeon: Jerilee Field, MD;  Location: [facility];  Service: Urology;  Laterality: N/A;     Current Outpatient Medications:    b complex vitamins capsule, Take 1 capsule by mouth daily., Disp: , Rfl:    Cholecalciferol (VITAMIN D3) 1000 UNITS CAPS, Take 1,000 Units by mouth daily., Disp: , Rfl:    gabapentin (NEURONTIN) 100 MG capsule, Take 1 capsule (100 mg total) by mouth at bedtime., Disp: 30 capsule, Rfl: 0   Multiple Vitamins-Minerals (CENTRUM SILVER PO),  Take 1 tablet by mouth daily with breakfast., Disp: , Rfl:    omeprazole (PRILOSEC) 20 MG capsule, Take 20 mg by mouth daily before breakfast., Disp: , Rfl:    polyethylene glycol (MIRALAX / GLYCOLAX) 17 g packet, Take 17 g by mouth daily., Disp: 14 each, Rfl: 0   rosuvastatin (CRESTOR) 5 MG tablet, Take 5 mg by mouth daily., Disp: , Rfl:    traMADol (ULTRAM) 50 MG tablet, Take 50 mg by mouth every 6 (six) hours as needed., Disp: , Rfl:    TYLENOL 500 MG tablet, Take 500 mg by mouth every 6 (six) hours as  needed for mild pain or headache., Disp: , Rfl:   Allergies  Allergen Reactions   Amoxicillin Nausea And Vomiting   Amoxicillin-Pot Clavulanate Nausea And Vomiting   Moxifloxacin Nausea And Vomiting   Nsaids Other (See Comments)    Elevated kidney "tests"          Objective:  Physical Exam  General: AAO x3, NAD  Dermatological: Second nails hypertrophic, dystrophic.  No pain with toenail itself.  Vascular: Dorsalis Pedis artery and Posterior Tibial artery pedal pulses are 2/4 bilateral with immedate capillary fill time. There is no pain with calf compression, swelling, warmth, erythema.   Neruologic: Grossly intact via light touch bilateral.  Negative Tinel sign.  Sensation intact with Semmes Weinstein monofilament.  Musculoskeletal: Unable to appreciate any significant area pinpoint tenderness identified today.  He gets symptoms starting over the MPJ going distally under the second toe.  Upon weightbearing evaluation there is medial deviation of the toe.  Mild contracture of the toe upon weightbearing as well.,  Unable to palpate or significant neuroma.  Gait: Unassisted, Nonantalgic.       Assessment:   78 year old male with neuritis right second toe    Plan:  -Treatment options discussed including all alternatives, risks, and complications -Etiology of symptoms were discussed-discussed possible neuroma.  I do not think this is gout given the longevity of the symptoms.  He has previously seen neurology and it seems to localize some not convinced this is a true neuropathy. -X-rays were obtained and reviewed with the patient.  3 views right foot were obtained.  No evidence of acute fracture.  Medial deviation of toe.  Arthritic changes present MTPJ. -We discussed different treatment options.  Dispensed neuroma, metatarsal pads.  We discussed icing daily. -Gabapentin 100mg  at bedtime, monitor for side affects -Consider advanced imaging if symptoms persist.  Return in about 4  weeks (around 03/19/2023).  Shawn Walsh DPM

## 2023-03-19 ENCOUNTER — Ambulatory Visit (INDEPENDENT_AMBULATORY_CARE_PROVIDER_SITE_OTHER): Payer: Medicare Other | Admitting: Podiatry

## 2023-03-19 ENCOUNTER — Encounter: Payer: Self-pay | Admitting: Podiatry

## 2023-03-19 DIAGNOSIS — M792 Neuralgia and neuritis, unspecified: Secondary | ICD-10-CM

## 2023-03-19 DIAGNOSIS — D3610 Benign neoplasm of peripheral nerves and autonomic nervous system, unspecified: Secondary | ICD-10-CM | POA: Diagnosis not present

## 2023-03-21 NOTE — Progress Notes (Signed)
 Subjective:   Patient ID: Shawn Walsh, male   DOB: 77 y.o.   MRN: [id number]   HPI Chief Complaint  Patient presents with   Toe Pain    RM#13 Follow up on right second toe states having tingling sensation medication not helping. No pain having worsening sensation more at night frequency is increasing.     78 year old male presents the office today with the above concerns.  He states that he is still getting tingling sensation into the toe.  He points on the second toe but also just proximal to the MPJ where he gets the discomfort.  This been ongoing now for many years but seems to getting worse.  He denies any back pain or any radiating pain.  He did previously see neurologist as well.  He has not tried padding that significant improvement as well.  Did not see any improvement with gabapentin.      Objective:  Physical Exam  General: AAO x3, NAD  Dermatological: Second nails hypertrophic, dystrophic.  No pain with toenail itself.  Vascular: Dorsalis Pedis artery and Posterior Tibial artery pedal pulses are 2/4 bilateral with immedate capillary fill time. There is no pain with calf compression, swelling, warmth, erythema.   Neruologic: Grossly intact via light touch bilateral.  Negative Tinel sign.  Sensation intact with Semmes Weinstein monofilament.  Musculoskeletal: Unable to appreciate any significant area pinpoint tenderness identified today.  He gets symptoms starting over the second MPJ going distally under the second toe but also proximal to the MPJ.  Upon weightbearing evaluation there is medial deviation of the toe.  Mild contracture of the toe upon weightbearing as well.,  Unable to palpate or significant neuroma.  Gait: Unassisted, Nonantalgic.       Assessment:   78 year old male with neuritis right second toe    Plan:  -Treatment options discussed including all alternatives, risks, and complications -Given ongoing nature of symptoms despite conservative  treatment have ordered an MRI.  Discussed possible nerve conduction test as needed however wants to have the MRI.  Will hold off any further medications for now.  [name]

## 2023-04-09 ENCOUNTER — Ambulatory Visit
Admission: RE | Admit: 2023-04-09 | Discharge: 2023-04-09 | Disposition: A | Discharge status: Home / Self Care | Source: Ambulatory Visit | Attending: Podiatry | Admitting: Podiatry

## 2023-04-09 DIAGNOSIS — D3610 Benign neoplasm of peripheral nerves and autonomic nervous system, unspecified: Secondary | ICD-10-CM

## 2023-04-09 DIAGNOSIS — R200 Anesthesia of skin: Secondary | ICD-10-CM | POA: Diagnosis not present

## 2023-04-09 MED ORDER — GADOPICLENOL 0.5 MMOL/ML IV SOLN
7.0000 mL | Freq: Once | INTRAVENOUS | Status: AC | PRN
  Administered 2023-04-09: 7 mL via INTRAVENOUS

## 2023-04-24 ENCOUNTER — Telehealth: Payer: Self-pay | Admitting: Urology

## 2023-04-24 NOTE — Telephone Encounter (Signed)
 Pt is calling wanting to know MRI results. Had MRI done on 04/09/23. I did inform pt that it is taking 2-4 wks for us  to get results back.  Pts call back #[phone]

## 2023-04-30 ENCOUNTER — Encounter: Payer: Self-pay | Admitting: Podiatry

## 2023-05-01 ENCOUNTER — Other Ambulatory Visit: Payer: Self-pay | Admitting: Podiatry

## 2023-05-01 DIAGNOSIS — M792 Neuralgia and neuritis, unspecified: Secondary | ICD-10-CM

## 2023-05-01 NOTE — Telephone Encounter (Signed)
 Can the referral for the NCV be faxed [facility]? Thanks!

## 2023-05-23 DIAGNOSIS — G629 Polyneuropathy, unspecified: Secondary | ICD-10-CM | POA: Diagnosis not present

## 2023-05-29 ENCOUNTER — Encounter: Payer: Self-pay | Admitting: Podiatry

## 2023-06-04 ENCOUNTER — Ambulatory Visit: Payer: Self-pay | Admitting: Podiatry

## 2023-06-05 NOTE — Telephone Encounter (Signed)
-----   Message from Charity Conch sent at 06/04/2023  6:55 PM EDT ----- Can we schedule him a follow-up? Thanks!

## 2023-09-17 DIAGNOSIS — E349 Endocrine disorder, unspecified: Secondary | ICD-10-CM | POA: Diagnosis not present

## 2023-09-17 DIAGNOSIS — Z8546 Personal history of malignant neoplasm of prostate: Secondary | ICD-10-CM | POA: Diagnosis not present

## 2023-10-03 DIAGNOSIS — Z8546 Personal history of malignant neoplasm of prostate: Secondary | ICD-10-CM | POA: Diagnosis not present

## 2023-10-03 DIAGNOSIS — E291 Testicular hypofunction: Secondary | ICD-10-CM | POA: Diagnosis not present

## 2023-10-10 DIAGNOSIS — H04123 Dry eye syndrome of bilateral lacrimal glands: Secondary | ICD-10-CM | POA: Diagnosis not present

## 2023-10-10 DIAGNOSIS — H5203 Hypermetropia, bilateral: Secondary | ICD-10-CM | POA: Diagnosis not present

## 2023-10-10 DIAGNOSIS — H353131 Nonexudative age-related macular degeneration, bilateral, early dry stage: Secondary | ICD-10-CM | POA: Diagnosis not present

## 2023-10-10 DIAGNOSIS — H52223 Regular astigmatism, bilateral: Secondary | ICD-10-CM | POA: Diagnosis not present

## 2023-10-10 DIAGNOSIS — H524 Presbyopia: Secondary | ICD-10-CM | POA: Diagnosis not present

## 2023-10-10 DIAGNOSIS — H2513 Age-related nuclear cataract, bilateral: Secondary | ICD-10-CM | POA: Diagnosis not present

## 2023-10-16 DIAGNOSIS — Z23 Encounter for immunization: Secondary | ICD-10-CM | POA: Diagnosis not present

## 2023-10-17 ENCOUNTER — Other Ambulatory Visit: Payer: Self-pay

## 2023-10-17 ENCOUNTER — Encounter (HOSPITAL_BASED_OUTPATIENT_CLINIC_OR_DEPARTMENT_OTHER): Payer: Self-pay | Admitting: Emergency Medicine

## 2023-10-17 ENCOUNTER — Emergency Department (HOSPITAL_BASED_OUTPATIENT_CLINIC_OR_DEPARTMENT_OTHER)
Admission: EM | Admit: 2023-10-17 | Discharge: 2023-10-17 | Disposition: A | Discharge status: Home / Self Care | Attending: Emergency Medicine | Admitting: Emergency Medicine

## 2023-10-17 ENCOUNTER — Emergency Department (HOSPITAL_BASED_OUTPATIENT_CLINIC_OR_DEPARTMENT_OTHER)

## 2023-10-17 DIAGNOSIS — U071 COVID-19: Secondary | ICD-10-CM | POA: Diagnosis not present

## 2023-10-17 DIAGNOSIS — I771 Stricture of artery: Secondary | ICD-10-CM | POA: Diagnosis not present

## 2023-10-17 DIAGNOSIS — R531 Weakness: Secondary | ICD-10-CM | POA: Diagnosis not present

## 2023-10-17 DIAGNOSIS — I700 Atherosclerosis of aorta: Secondary | ICD-10-CM | POA: Diagnosis not present

## 2023-10-17 DIAGNOSIS — R0981 Nasal congestion: Secondary | ICD-10-CM | POA: Diagnosis present

## 2023-10-17 LAB — CBC WITH DIFFERENTIAL/PLATELET
Abs Immature Granulocytes: 0.05 K/uL (ref 0.00–0.07)
Basophils Absolute: 0 K/uL (ref 0.0–0.1)
Basophils Relative: 0 %
Eosinophils Absolute: 0.1 K/uL (ref 0.0–0.5)
Eosinophils Relative: 1 %
HCT: 39.8 % (ref 39.0–52.0)
Hemoglobin: 13.4 g/dL (ref 13.0–17.0)
Immature Granulocytes: 1 %
Lymphocytes Relative: 31 %
Lymphs Abs: 3.4 K/uL (ref 0.7–4.0)
MCH: 29.8 pg (ref 26.0–34.0)
MCHC: 33.7 g/dL (ref 30.0–36.0)
MCV: 88.6 fL (ref 80.0–100.0)
Monocytes Absolute: 1 K/uL (ref 0.1–1.0)
Monocytes Relative: 9 %
Neutro Abs: 6.2 K/uL (ref 1.7–7.7)
Neutrophils Relative %: 58 %
Platelets: 189 K/uL (ref 150–400)
RBC: 4.49 MIL/uL (ref 4.22–5.81)
RDW: 14.4 % (ref 11.5–15.5)
WBC: 10.7 K/uL — ABNORMAL HIGH (ref 4.0–10.5)
nRBC: 0 % (ref 0.0–0.2)

## 2023-10-17 LAB — RESP PANEL BY RT-PCR (RSV, FLU A&B, COVID)  RVPGX2
Influenza A by PCR: NEGATIVE
Influenza B by PCR: NEGATIVE
Resp Syncytial Virus by PCR: NEGATIVE
SARS Coronavirus 2 by RT PCR: POSITIVE — AB

## 2023-10-17 LAB — BASIC METABOLIC PANEL WITH GFR
Anion gap: 15 (ref 5–15)
BUN: 19 mg/dL (ref 8–23)
CO2: 20 mmol/L — ABNORMAL LOW (ref 22–32)
Calcium: 9.4 mg/dL (ref 8.9–10.3)
Chloride: 98 mmol/L (ref 98–111)
Creatinine, Ser: 1.27 mg/dL — ABNORMAL HIGH (ref 0.61–1.24)
GFR, Estimated: 58 mL/min — ABNORMAL LOW (ref >60)
Glucose, Bld: 121 mg/dL — ABNORMAL HIGH (ref 70–99)
Potassium: 3.5 mmol/L (ref 3.5–5.1)
Sodium: 133 mmol/L — ABNORMAL LOW (ref 135–145)

## 2023-10-17 LAB — TROPONIN T, HIGH SENSITIVITY: Troponin T High Sensitivity: <15 ng/L (ref 0–19)

## 2023-10-17 LAB — D-DIMER, QUANTITATIVE: D-Dimer, Quant: 0.46 ug{FEU}/mL (ref 0.00–0.50)

## 2023-10-17 LAB — CBG MONITORING, ED: Glucose-Capillary: 115 mg/dL — ABNORMAL HIGH (ref 70–99)

## 2023-10-17 MED ORDER — SODIUM CHLORIDE 0.9 % IV BOLUS
1000.0000 mL | Freq: Once | INTRAVENOUS | Status: AC
  Administered 2023-10-17: 1000 mL via INTRAVENOUS

## 2023-10-17 MED ORDER — ACETAMINOPHEN 500 MG PO TABS
1000.0000 mg | ORAL_TABLET | Freq: Once | ORAL | Status: AC
  Administered 2023-10-17: 1000 mg via ORAL
  Filled 2023-10-17: qty 2

## 2023-10-17 NOTE — ED Triage Notes (Signed)
 Reports feels like he's adverse reaction from Covid and Flu shot he received yesterday , feels  dehydrated, restless .

## 2023-10-17 NOTE — ED Provider Notes (Signed)
 [place] [facility] AT [facility] Provider Note   CSN: [id number] Arrival date & time: 10/17/23  1614     Patient presents with: Medication Reaction   Shawn Walsh is a 78 y.o. male.  With a history of prostate cancer small bowel obstruction status post resection who presents to the ED for dehydration.  For the last 3 days patient has experienced mild nasal congestion with some lightheadedness.  He received both COVID and flu vaccination yesterday.  Feels worse today voices concern for dehydration.  Feels warm but no shaking chills.  No respiratory stress nausea vomiting chest pain   HPI     Prior to Admission medications   Medication Sig Start Date End Date Taking? Authorizing Provider  b complex vitamins capsule Take 1 capsule by mouth daily.    [provider]  Cholecalciferol  (VITAMIN D3) 1000 UNITS CAPS Take 1,000 Units by mouth daily.    [provider]  gabapentin  (NEURONTIN ) 100 MG capsule Take 1 capsule (100 mg total) by mouth at bedtime. Patient not taking: Reported on 03/19/2023 02/19/23   Gershon Donnice SAUNDERS, DPM  Multiple Vitamins-Minerals (CENTRUM SILVER PO) Take 1 tablet by mouth daily with breakfast.    [provider]  omeprazole (PRILOSEC) 20 MG capsule Take 20 mg by mouth daily before breakfast.    [provider]  polyethylene glycol (MIRALAX  / GLYCOLAX ) 17 g packet Take 17 g by mouth daily. 07/27/22   Regalado, Belkys A, MD  rosuvastatin  (CRESTOR ) 5 MG tablet Take 5 mg by mouth daily.    [provider]  traMADol  (ULTRAM ) 50 MG tablet Take 50 mg by mouth every 6 (six) hours as needed.    [provider]  TYLENOL  500 MG tablet Take 500 mg by mouth every 6 (six) hours as needed for mild pain or headache.    [provider]    Allergies: Amoxicillin, Amoxicillin-pot clavulanate, Moxifloxacin, and Nsaids    Review of Systems  Updated Vital Signs BP 136/78   Pulse 79    Temp 98.6 F (37 C) (Oral)   Resp (!) 21   Wt 66.2 kg   SpO2 97%   BMI 22.87 kg/m   Physical Exam Vitals and nursing note reviewed.  HENT:     Head: Normocephalic and atraumatic.  Eyes:     Pupils: Pupils are equal, round, and reactive to light.  Cardiovascular:     Rate and Rhythm: Normal rate and regular rhythm.  Pulmonary:     Effort: Pulmonary effort is normal.     Breath sounds: Normal breath sounds.  Abdominal:     Palpations: Abdomen is soft.     Tenderness: There is no abdominal tenderness.  Skin:    General: Skin is warm and dry.  Neurological:     Mental Status: He is alert and oriented to person, place, and time.     Sensory: No sensory deficit.     Motor: No weakness.  Psychiatric:        Mood and Affect: Mood normal.     (all labs ordered are listed, but only abnormal results are displayed) Labs Reviewed  RESP PANEL BY RT-PCR (RSV, FLU A&B, COVID)  RVPGX2 - Abnormal; Notable for the following components:      Result Value   SARS Coronavirus 2 by RT PCR POSITIVE (*)    All other components within normal limits  BASIC METABOLIC PANEL WITH GFR - Abnormal; Notable for the following components:  Sodium 133 (*)    CO2 20 (*)    Glucose, Bld 121 (*)    Creatinine, Ser 1.27 (*)    GFR, Estimated 58 (*)    All other components within normal limits  CBC WITH DIFFERENTIAL/PLATELET - Abnormal; Notable for the following components:   WBC 10.7 (*)    All other components within normal limits  CBG MONITORING, ED - Abnormal; Notable for the following components:   Glucose-Capillary 115 (*)    All other components within normal limits  D-DIMER, QUANTITATIVE  TROPONIN T, HIGH SENSITIVITY    EKG: EKG Interpretation Date/Time:  Wednesday October 17 2023 17:21:29 EDT Ventricular Rate:  84 PR Interval:  185 QRS Duration:  84 QT Interval:  364 QTC Calculation: 431 R Axis:   146  Text Interpretation: Sinus rhythm Right axis deviation Abnormal R-wave  progression, early transition Confirmed by Pamella Sharper [phone]) on 10/17/2023 6:24:28 PM  Radiology: DG Chest Portable 1 View Result Date: 10/17/2023 CLINICAL DATA:  weakness ?pna EXAM: PORTABLE CHEST - 1 VIEW COMPARISON:  July 25, 2022 FINDINGS: No focal airspace consolidation, pleural effusion, or pneumothorax. No cardiomegaly.Tortuous aorta with aortic atherosclerosis.No acute fracture or destructive lesion. Multilevel thoracic osteophytosis. IMPRESSION: No acute cardiopulmonary abnormality. Electronically Signed   By: Rogelia Myers M.D.   On: 10/17/2023 17:49     Procedures   Medications Ordered in the ED  acetaminophen  (TYLENOL ) tablet 1,000 mg (1,000 mg Oral Given 10/17/23 1724)  sodium chloride  0.9 % bolus 1,000 mL (0 mLs Intravenous Stopped 10/17/23 1948)  sodium chloride  0.9 % bolus 1,000 mL (0 mLs Intravenous Stopped 10/17/23 2101)    Clinical Course as of 10/17/23 2135  Wed Oct 17, 2023  1722 Patient was attempting to walk to the bathroom with his wife when he felt lightheaded and had a witnessed syncopal episode.  Return immediately to neurologic baseline.  Cool and clammy thereafter.  No chest pain.  No focal neurologic deficit.  No associated injuries.  We transported him immediately to another room obtained IV access per [facility] monitoring gave 1 L IV fluids.  Slightly hypertensive now.  Suspect orthostatic hypotension versus dehydration versus vagal episode.   [MP]  1823 Workup notable for positive COVID test chest x-ray clear.  D-dimer not consistent with PE.  Troponin not consistent with MI.  BMP reflective of dehydration as a patient had suspected.  Normotensive after 1 L fluids.  Will give a second liter and continue to monitor ambulatory trial [MP]  2134 Patient feels much better after 2 L IV fluids.  Able to ambulate here without issue.  Requesting discharge home.  I am okay with this given that he looks well and has his wife and daughter to go home with.  He will  follow-up with PCP.  Counseled him on symptomatic management of COVID symptoms with Tylenol  adequate hydration and discussed return precautions [MP]    Clinical Course User Index [MP] Pamella Sharper LABOR, DO                                 Medical Decision Making 78 year old male with history as above presenting for lightheadedness.  Recent URI symptoms.  Received both COVID and flu vaccines yesterday.  No chest pain or shortness of breath.  Well-appearing on my exam no adventitious lung sounds.  Oral temperature of 100 Fahrenheit here normotensive not tachycardic or tachypneic stable on room air.  Benign physical exam.  Patient did have syncopal episode while attempting to ambulate to the bathroom here.  No associated trauma.  Return immediately to baseline.  Will obtain laboratory workup including [facility] CMP head CT troponin D-dimer EKG chest x-ray give IV fluids for rehydration.  Amount and/or Complexity of Data Reviewed Labs: ordered. Radiology: ordered.  Risk OTC drugs.        Final diagnoses:  COVID    ED Discharge Orders     None          Pamella Ozell LABOR, DO 10/17/23 2135

## 2023-10-17 NOTE — ED Notes (Signed)
 Patient had a syncopal episode in the hallway. Witnessed by family member. Dr. Pamella and multiple ED staff members at the side including this RN, Aleck PARAS., RN, Gabi, NT. Patient lifted into wheelchair with assistance from EMS staff and brought to room 014. Dr. Pamella at the bedside as well.

## 2023-10-17 NOTE — Discharge Instructions (Signed)
 You were seen in the Emergency Department for dizziness and nasal congestion You had an episode of fainting while you are here You felt much better after IV fluids Your COVID test was positive As discussed you need to keep well-hydrated at home drinking plenty of fluids Take Tylenol  as directed for fevers and discomfort Return to the emergency department for trouble breathing repeated fainting episodes or if you are not feeling well overall Otherwise follow-up with your primary doctor within 1 week for reevaluation

## 2023-10-23 DIAGNOSIS — E291 Testicular hypofunction: Secondary | ICD-10-CM | POA: Diagnosis not present

## 2023-11-25 DIAGNOSIS — R0989 Other specified symptoms and signs involving the circulatory and respiratory systems: Secondary | ICD-10-CM | POA: Diagnosis not present

## 2023-11-25 DIAGNOSIS — J40 Bronchitis, not specified as acute or chronic: Secondary | ICD-10-CM | POA: Diagnosis not present

## 2023-11-25 DIAGNOSIS — R051 Acute cough: Secondary | ICD-10-CM | POA: Diagnosis not present

## 2023-11-25 DIAGNOSIS — J029 Acute pharyngitis, unspecified: Secondary | ICD-10-CM | POA: Diagnosis not present
